# Patient Record
Sex: Female | Born: 1945
Health system: Southern US, Community
[De-identification: ages and names within clinical notes are randomized; demographics above are authoritative.]

## PROBLEM LIST (undated history)

## (undated) DIAGNOSIS — C679 Malignant neoplasm of bladder, unspecified: Secondary | ICD-10-CM

## (undated) DIAGNOSIS — C801 Malignant (primary) neoplasm, unspecified: Secondary | ICD-10-CM

## (undated) DIAGNOSIS — K439 Ventral hernia without obstruction or gangrene: Secondary | ICD-10-CM

## (undated) DIAGNOSIS — M1990 Unspecified osteoarthritis, unspecified site: Secondary | ICD-10-CM

## (undated) DIAGNOSIS — J302 Other seasonal allergic rhinitis: Secondary | ICD-10-CM

## (undated) DIAGNOSIS — I10 Essential (primary) hypertension: Secondary | ICD-10-CM

## (undated) DIAGNOSIS — G459 Transient cerebral ischemic attack, unspecified: Secondary | ICD-10-CM

## (undated) DIAGNOSIS — I739 Peripheral vascular disease, unspecified: Secondary | ICD-10-CM

## (undated) DIAGNOSIS — E785 Hyperlipidemia, unspecified: Secondary | ICD-10-CM

## (undated) DIAGNOSIS — IMO0001 Reserved for inherently not codable concepts without codable children: Secondary | ICD-10-CM

## (undated) DIAGNOSIS — Z5189 Encounter for other specified aftercare: Secondary | ICD-10-CM

## (undated) HISTORY — DX: Ventral hernia without obstruction or gangrene: K43.9

## (undated) HISTORY — DX: Transient cerebral ischemic attack, unspecified: G45.9

## (undated) HISTORY — DX: Malignant (primary) neoplasm, unspecified: C80.1

## (undated) HISTORY — DX: Peripheral vascular disease, unspecified: I73.9

## (undated) HISTORY — PX: BREAST SURGERY: SHX581

## (undated) HISTORY — DX: Hyperlipidemia, unspecified: E78.5

## (undated) HISTORY — PX: HERNIA REPAIR: SHX51

## (undated) HISTORY — DX: Essential (primary) hypertension: I10

## (undated) HISTORY — PX: DILATION AND CURETTAGE OF UTERUS: SHX78

## (undated) HISTORY — DX: Other seasonal allergic rhinitis: J30.2

## (undated) HISTORY — PX: ABDOMINAL HYSTERECTOMY: SHX81

---

## 2001-01-25 ENCOUNTER — Encounter: Admission: RE | Admit: 2001-01-25 | Discharge: 2001-04-25 | Payer: Self-pay | Admitting: *Deleted

## 2001-03-31 ENCOUNTER — Ambulatory Visit (HOSPITAL_COMMUNITY): Admission: RE | Admit: 2001-03-31 | Discharge: 2001-03-31 | Payer: Self-pay | Admitting: Gastroenterology

## 2001-04-21 ENCOUNTER — Encounter: Payer: Self-pay | Admitting: General Surgery

## 2001-04-21 ENCOUNTER — Encounter: Admission: RE | Admit: 2001-04-21 | Discharge: 2001-04-21 | Payer: Self-pay | Admitting: General Surgery

## 2001-04-21 ENCOUNTER — Other Ambulatory Visit: Admission: RE | Admit: 2001-04-21 | Discharge: 2001-04-21 | Payer: Self-pay | Admitting: General Surgery

## 2001-05-07 ENCOUNTER — Encounter: Admission: RE | Admit: 2001-05-07 | Discharge: 2001-05-07 | Payer: Self-pay | Admitting: General Surgery

## 2001-05-07 ENCOUNTER — Encounter: Payer: Self-pay | Admitting: General Surgery

## 2001-05-10 ENCOUNTER — Ambulatory Visit (HOSPITAL_BASED_OUTPATIENT_CLINIC_OR_DEPARTMENT_OTHER): Admission: RE | Admit: 2001-05-10 | Discharge: 2001-05-10 | Payer: Self-pay | Admitting: General Surgery

## 2001-05-10 ENCOUNTER — Encounter (INDEPENDENT_AMBULATORY_CARE_PROVIDER_SITE_OTHER): Payer: Self-pay | Admitting: *Deleted

## 2001-05-10 ENCOUNTER — Encounter: Payer: Self-pay | Admitting: General Surgery

## 2001-05-10 ENCOUNTER — Encounter: Admission: RE | Admit: 2001-05-10 | Discharge: 2001-05-10 | Payer: Self-pay | Admitting: General Surgery

## 2001-05-24 ENCOUNTER — Ambulatory Visit: Admission: RE | Admit: 2001-05-24 | Discharge: 2001-08-22 | Payer: Self-pay | Admitting: Radiation Oncology

## 2001-06-17 ENCOUNTER — Observation Stay (HOSPITAL_COMMUNITY): Admission: RE | Admit: 2001-06-17 | Discharge: 2001-06-18 | Payer: Self-pay | Admitting: General Surgery

## 2001-06-17 ENCOUNTER — Encounter (INDEPENDENT_AMBULATORY_CARE_PROVIDER_SITE_OTHER): Payer: Self-pay | Admitting: Specialist

## 2001-07-23 ENCOUNTER — Encounter: Payer: Self-pay | Admitting: *Deleted

## 2001-07-23 ENCOUNTER — Ambulatory Visit (HOSPITAL_COMMUNITY): Admission: RE | Admit: 2001-07-23 | Discharge: 2001-07-23 | Payer: Self-pay | Admitting: *Deleted

## 2001-07-27 ENCOUNTER — Ambulatory Visit (HOSPITAL_COMMUNITY): Admission: RE | Admit: 2001-07-27 | Discharge: 2001-07-27 | Payer: Self-pay | Admitting: *Deleted

## 2001-07-27 ENCOUNTER — Encounter: Payer: Self-pay | Admitting: *Deleted

## 2001-07-29 ENCOUNTER — Encounter: Payer: Self-pay | Admitting: Surgery

## 2001-07-29 ENCOUNTER — Ambulatory Visit (HOSPITAL_BASED_OUTPATIENT_CLINIC_OR_DEPARTMENT_OTHER): Admission: RE | Admit: 2001-07-29 | Discharge: 2001-07-29 | Payer: Self-pay | Admitting: Surgery

## 2001-08-24 ENCOUNTER — Emergency Department (HOSPITAL_COMMUNITY): Admission: EM | Admit: 2001-08-24 | Discharge: 2001-08-24 | Payer: Self-pay | Admitting: Emergency Medicine

## 2001-08-24 ENCOUNTER — Encounter: Payer: Self-pay | Admitting: Emergency Medicine

## 2001-08-27 ENCOUNTER — Encounter: Admission: RE | Admit: 2001-08-27 | Discharge: 2001-09-09 | Payer: Self-pay | Admitting: *Deleted

## 2001-12-02 ENCOUNTER — Ambulatory Visit (HOSPITAL_COMMUNITY): Admission: RE | Admit: 2001-12-02 | Discharge: 2001-12-02 | Payer: Self-pay | Admitting: General Surgery

## 2001-12-16 ENCOUNTER — Ambulatory Visit: Admission: RE | Admit: 2001-12-16 | Discharge: 2002-03-16 | Payer: Self-pay | Admitting: Radiation Oncology

## 2002-03-03 ENCOUNTER — Other Ambulatory Visit: Admission: RE | Admit: 2002-03-03 | Discharge: 2002-03-03 | Payer: Self-pay | Admitting: *Deleted

## 2002-10-06 ENCOUNTER — Encounter: Payer: Self-pay | Admitting: General Surgery

## 2002-10-06 ENCOUNTER — Encounter: Admission: RE | Admit: 2002-10-06 | Discharge: 2002-10-06 | Payer: Self-pay | Admitting: General Surgery

## 2002-12-07 ENCOUNTER — Encounter: Payer: Self-pay | Admitting: General Surgery

## 2002-12-07 ENCOUNTER — Encounter: Admission: RE | Admit: 2002-12-07 | Discharge: 2002-12-07 | Payer: Self-pay | Admitting: General Surgery

## 2002-12-07 ENCOUNTER — Encounter (INDEPENDENT_AMBULATORY_CARE_PROVIDER_SITE_OTHER): Payer: Self-pay | Admitting: Specialist

## 2003-09-25 ENCOUNTER — Other Ambulatory Visit: Admission: RE | Admit: 2003-09-25 | Discharge: 2003-09-25 | Payer: Self-pay | Admitting: Family Medicine

## 2004-11-03 ENCOUNTER — Emergency Department (HOSPITAL_COMMUNITY): Admission: EM | Admit: 2004-11-03 | Discharge: 2004-11-03 | Payer: Self-pay | Admitting: Family Medicine

## 2004-12-02 ENCOUNTER — Ambulatory Visit: Payer: Self-pay | Admitting: Hematology & Oncology

## 2005-02-13 ENCOUNTER — Other Ambulatory Visit: Admission: RE | Admit: 2005-02-13 | Discharge: 2005-02-13 | Payer: Self-pay | Admitting: Family Medicine

## 2005-06-05 ENCOUNTER — Ambulatory Visit: Payer: Self-pay | Admitting: Hematology & Oncology

## 2005-12-23 ENCOUNTER — Ambulatory Visit: Payer: Self-pay | Admitting: Hematology & Oncology

## 2006-06-22 ENCOUNTER — Ambulatory Visit: Payer: Self-pay | Admitting: Hematology & Oncology

## 2006-07-24 LAB — CBC WITH DIFFERENTIAL/PLATELET
BASO%: 0.4 % (ref 0.0–2.0)
Basophils Absolute: 0 10*3/uL (ref 0.0–0.1)
HCT: 40.5 % (ref 34.8–46.6)
HGB: 13.5 g/dL (ref 11.6–15.9)
MCHC: 33.3 g/dL (ref 32.0–36.0)
MONO#: 0.4 10*3/uL (ref 0.1–0.9)
NEUT%: 41.2 % (ref 39.6–76.8)
RDW: 15.4 % — ABNORMAL HIGH (ref 11.3–14.5)
WBC: 7 10*3/uL (ref 3.9–10.0)
lymph#: 3.6 10*3/uL — ABNORMAL HIGH (ref 0.9–3.3)

## 2006-07-24 LAB — CANCER ANTIGEN 27.29: CA 27.29: 35 U/mL (ref 0–39)

## 2006-07-24 LAB — COMPREHENSIVE METABOLIC PANEL
ALT: 17 U/L (ref 0–40)
Albumin: 4.3 g/dL (ref 3.5–5.2)
CO2: 26 mEq/L (ref 19–32)
Calcium: 9.5 mg/dL (ref 8.4–10.5)
Chloride: 104 mEq/L (ref 96–112)
Creatinine, Ser: 0.9 mg/dL (ref 0.40–1.20)
Potassium: 4.4 mEq/L (ref 3.5–5.3)

## 2006-08-03 ENCOUNTER — Ambulatory Visit (HOSPITAL_COMMUNITY): Admission: RE | Admit: 2006-08-03 | Discharge: 2006-08-03 | Payer: Self-pay | Admitting: Hematology & Oncology

## 2006-09-02 ENCOUNTER — Ambulatory Visit: Payer: Self-pay | Admitting: Hematology & Oncology

## 2007-01-13 ENCOUNTER — Ambulatory Visit: Payer: Self-pay | Admitting: Hematology & Oncology

## 2007-02-11 LAB — COMPREHENSIVE METABOLIC PANEL
ALT: 12 U/L (ref 0–35)
AST: 18 U/L (ref 0–37)
Albumin: 4.1 g/dL (ref 3.5–5.2)
Alkaline Phosphatase: 108 U/L (ref 39–117)
Calcium: 9.2 mg/dL (ref 8.4–10.5)
Chloride: 106 mEq/L (ref 96–112)
Potassium: 3.5 mEq/L (ref 3.5–5.3)
Sodium: 142 mEq/L (ref 135–145)

## 2007-02-11 LAB — CBC WITH DIFFERENTIAL/PLATELET
BASO%: 0.7 % (ref 0.0–2.0)
EOS%: 0.9 % (ref 0.0–7.0)
HGB: 12.6 g/dL (ref 11.6–15.9)
MCH: 29.4 pg (ref 26.0–34.0)
MCHC: 33.6 g/dL (ref 32.0–36.0)
MCV: 87.4 fL (ref 81.0–101.0)
MONO%: 4.9 % (ref 0.0–13.0)
RBC: 4.29 10*6/uL (ref 3.70–5.32)
RDW: 15 % — ABNORMAL HIGH (ref 11.3–14.5)
lymph#: 2.5 10*3/uL (ref 0.9–3.3)

## 2007-08-09 ENCOUNTER — Ambulatory Visit: Payer: Self-pay | Admitting: Hematology & Oncology

## 2007-09-30 ENCOUNTER — Ambulatory Visit: Payer: Self-pay | Admitting: Hematology & Oncology

## 2007-10-04 LAB — CBC WITH DIFFERENTIAL/PLATELET
BASO%: 1.5 % (ref 0.0–2.0)
Basophils Absolute: 0.1 10*3/uL (ref 0.0–0.1)
EOS%: 1.1 % (ref 0.0–7.0)
HGB: 12.9 g/dL (ref 11.6–15.9)
MCH: 29.9 pg (ref 26.0–34.0)
MCHC: 34.2 g/dL (ref 32.0–36.0)
MCV: 87.5 fL (ref 81.0–101.0)
MONO%: 4.1 % (ref 0.0–13.0)
NEUT%: 46.7 % (ref 39.6–76.8)
RDW: 11.8 % (ref 11.3–14.5)
lymph#: 2.8 10*3/uL (ref 0.9–3.3)

## 2007-10-04 LAB — COMPREHENSIVE METABOLIC PANEL
ALT: 13 U/L (ref 0–35)
AST: 20 U/L (ref 0–37)
Alkaline Phosphatase: 110 U/L (ref 39–117)
BUN: 14 mg/dL (ref 6–23)
Chloride: 107 mEq/L (ref 96–112)
Creatinine, Ser: 0.88 mg/dL (ref 0.40–1.20)
Potassium: 3.8 mEq/L (ref 3.5–5.3)

## 2008-03-28 ENCOUNTER — Ambulatory Visit: Payer: Self-pay | Admitting: Hematology & Oncology

## 2008-05-11 ENCOUNTER — Ambulatory Visit: Payer: Self-pay | Admitting: Hematology & Oncology

## 2008-05-15 LAB — CBC WITH DIFFERENTIAL/PLATELET
Basophils Absolute: 0 10*3/uL (ref 0.0–0.1)
Eosinophils Absolute: 0.1 10*3/uL (ref 0.0–0.5)
HCT: 38.9 % (ref 34.8–46.6)
HGB: 13.1 g/dL (ref 11.6–15.9)
MONO#: 0.4 10*3/uL (ref 0.1–0.9)
NEUT%: 47.7 % (ref 39.6–76.8)
WBC: 5.5 10*3/uL (ref 3.9–10.0)
lymph#: 2.4 10*3/uL (ref 0.9–3.3)

## 2008-05-15 LAB — COMPREHENSIVE METABOLIC PANEL
BUN: 13 mg/dL (ref 6–23)
CO2: 25 mEq/L (ref 19–32)
Calcium: 8.9 mg/dL (ref 8.4–10.5)
Chloride: 106 mEq/L (ref 96–112)
Creatinine, Ser: 0.77 mg/dL (ref 0.40–1.20)

## 2008-10-31 ENCOUNTER — Ambulatory Visit: Payer: Self-pay | Admitting: Hematology & Oncology

## 2008-11-02 LAB — CBC WITH DIFFERENTIAL/PLATELET
Basophils Absolute: 0 10*3/uL (ref 0.0–0.1)
EOS%: 1 % (ref 0.0–7.0)
HCT: 40.9 % (ref 34.8–46.6)
HGB: 13.6 g/dL (ref 11.6–15.9)
MCH: 29.3 pg (ref 26.0–34.0)
MCV: 87.8 fL (ref 81.0–101.0)
NEUT%: 47.8 % (ref 39.6–76.8)
lymph#: 3.2 10*3/uL (ref 0.9–3.3)

## 2008-11-03 LAB — COMPREHENSIVE METABOLIC PANEL
AST: 23 U/L (ref 0–37)
BUN: 15 mg/dL (ref 6–23)
CO2: 27 mEq/L (ref 19–32)
Calcium: 9.6 mg/dL (ref 8.4–10.5)
Chloride: 100 mEq/L (ref 96–112)
Creatinine, Ser: 0.93 mg/dL (ref 0.40–1.20)

## 2009-02-27 ENCOUNTER — Ambulatory Visit: Payer: Self-pay | Admitting: Hematology & Oncology

## 2009-04-18 ENCOUNTER — Ambulatory Visit: Payer: Self-pay | Admitting: Oncology

## 2009-04-20 LAB — CBC WITH DIFFERENTIAL/PLATELET
Basophils Absolute: 0 10*3/uL (ref 0.0–0.1)
EOS%: 1.2 % (ref 0.0–7.0)
HCT: 35.6 % (ref 34.8–46.6)
HGB: 11.9 g/dL (ref 11.6–15.9)
LYMPH%: 41.3 % (ref 14.0–49.7)
MCH: 29.7 pg (ref 25.1–34.0)
MCV: 88.7 fL (ref 79.5–101.0)
MONO%: 7.9 % (ref 0.0–14.0)
NEUT%: 48.8 % (ref 38.4–76.8)

## 2009-04-20 LAB — COMPREHENSIVE METABOLIC PANEL
AST: 16 U/L (ref 0–37)
Alkaline Phosphatase: 90 U/L (ref 39–117)
BUN: 15 mg/dL (ref 6–23)
Calcium: 9.3 mg/dL (ref 8.4–10.5)
Creatinine, Ser: 0.84 mg/dL (ref 0.40–1.20)
Total Bilirubin: 0.3 mg/dL (ref 0.3–1.2)

## 2009-05-28 ENCOUNTER — Encounter: Admission: RE | Admit: 2009-05-28 | Discharge: 2009-05-28 | Payer: Self-pay | Admitting: *Deleted

## 2010-03-19 ENCOUNTER — Ambulatory Visit: Payer: Self-pay | Admitting: Oncology

## 2010-04-29 ENCOUNTER — Ambulatory Visit: Payer: Self-pay | Admitting: Oncology

## 2010-05-01 LAB — COMPREHENSIVE METABOLIC PANEL
Albumin: 3.9 g/dL (ref 3.5–5.2)
CO2: 27 mEq/L (ref 19–32)
Calcium: 9 mg/dL (ref 8.4–10.5)
Glucose, Bld: 85 mg/dL (ref 70–99)
Potassium: 3.7 mEq/L (ref 3.5–5.3)
Sodium: 141 mEq/L (ref 135–145)
Total Protein: 7.3 g/dL (ref 6.0–8.3)

## 2010-05-01 LAB — CBC WITH DIFFERENTIAL/PLATELET
Eosinophils Absolute: 0 10*3/uL (ref 0.0–0.5)
LYMPH%: 44.1 % (ref 14.0–49.7)
MONO#: 0.5 10*3/uL (ref 0.1–0.9)
NEUT#: 2.9 10*3/uL (ref 1.5–6.5)
Platelets: 205 10*3/uL (ref 145–400)
RBC: 4.24 10*6/uL (ref 3.70–5.45)
RDW: 14.5 % (ref 11.2–14.5)
WBC: 6.4 10*3/uL (ref 3.9–10.3)

## 2010-05-09 ENCOUNTER — Emergency Department (HOSPITAL_COMMUNITY): Admission: EM | Admit: 2010-05-09 | Discharge: 2010-05-09 | Payer: Self-pay | Admitting: Family Medicine

## 2010-11-17 HISTORY — PX: COLONOSCOPY: SHX174

## 2010-12-19 ENCOUNTER — Emergency Department (HOSPITAL_COMMUNITY)
Admission: EM | Admit: 2010-12-19 | Discharge: 2010-12-19 | Disposition: A | Discharge status: Home / Self Care | Payer: Managed Care, Other (non HMO) | Attending: Emergency Medicine | Admitting: Emergency Medicine

## 2010-12-19 ENCOUNTER — Emergency Department (HOSPITAL_COMMUNITY): Payer: Managed Care, Other (non HMO)

## 2010-12-19 DIAGNOSIS — M5412 Radiculopathy, cervical region: Secondary | ICD-10-CM | POA: Insufficient documentation

## 2010-12-19 DIAGNOSIS — M79609 Pain in unspecified limb: Secondary | ICD-10-CM | POA: Insufficient documentation

## 2010-12-19 DIAGNOSIS — I10 Essential (primary) hypertension: Secondary | ICD-10-CM | POA: Insufficient documentation

## 2010-12-19 DIAGNOSIS — F17200 Nicotine dependence, unspecified, uncomplicated: Secondary | ICD-10-CM | POA: Insufficient documentation

## 2011-02-14 ENCOUNTER — Other Ambulatory Visit: Payer: Self-pay | Admitting: Family Medicine

## 2011-02-14 ENCOUNTER — Ambulatory Visit (INDEPENDENT_AMBULATORY_CARE_PROVIDER_SITE_OTHER): Payer: Managed Care, Other (non HMO) | Admitting: Family Medicine

## 2011-02-14 ENCOUNTER — Ambulatory Visit
Admission: RE | Admit: 2011-02-14 | Discharge: 2011-02-14 | Disposition: A | Discharge status: Home / Self Care | Payer: Managed Care, Other (non HMO) | Source: Ambulatory Visit | Attending: Family Medicine | Admitting: Family Medicine

## 2011-02-14 DIAGNOSIS — F17200 Nicotine dependence, unspecified, uncomplicated: Secondary | ICD-10-CM

## 2011-02-14 DIAGNOSIS — Z853 Personal history of malignant neoplasm of breast: Secondary | ICD-10-CM

## 2011-02-14 DIAGNOSIS — M79604 Pain in right leg: Secondary | ICD-10-CM

## 2011-02-14 DIAGNOSIS — M79605 Pain in left leg: Secondary | ICD-10-CM

## 2011-02-14 DIAGNOSIS — M79609 Pain in unspecified limb: Secondary | ICD-10-CM

## 2011-02-21 ENCOUNTER — Inpatient Hospital Stay (INDEPENDENT_AMBULATORY_CARE_PROVIDER_SITE_OTHER)
Admission: RE | Admit: 2011-02-21 | Discharge: 2011-02-21 | Disposition: A | Discharge status: Home / Self Care | Payer: Managed Care, Other (non HMO) | Source: Ambulatory Visit

## 2011-02-21 DIAGNOSIS — J309 Allergic rhinitis, unspecified: Secondary | ICD-10-CM

## 2011-03-11 HISTORY — PX: NM MYOCAR PERF WALL MOTION: HXRAD629

## 2011-03-16 ENCOUNTER — Inpatient Hospital Stay (INDEPENDENT_AMBULATORY_CARE_PROVIDER_SITE_OTHER)
Admission: RE | Admit: 2011-03-16 | Discharge: 2011-03-16 | Disposition: A | Discharge status: Home / Self Care | Payer: Managed Care, Other (non HMO) | Source: Ambulatory Visit | Attending: Family Medicine | Admitting: Family Medicine

## 2011-03-16 ENCOUNTER — Emergency Department (HOSPITAL_COMMUNITY)
Admission: EM | Admit: 2011-03-16 | Discharge: 2011-03-17 | Disposition: A | Discharge status: Home / Self Care | Payer: Managed Care, Other (non HMO) | Attending: Emergency Medicine | Admitting: Emergency Medicine

## 2011-03-16 DIAGNOSIS — R109 Unspecified abdominal pain: Secondary | ICD-10-CM | POA: Insufficient documentation

## 2011-03-16 DIAGNOSIS — I745 Embolism and thrombosis of iliac artery: Secondary | ICD-10-CM | POA: Insufficient documentation

## 2011-03-16 DIAGNOSIS — Z853 Personal history of malignant neoplasm of breast: Secondary | ICD-10-CM | POA: Insufficient documentation

## 2011-03-16 DIAGNOSIS — N23 Unspecified renal colic: Secondary | ICD-10-CM

## 2011-03-16 DIAGNOSIS — I10 Essential (primary) hypertension: Secondary | ICD-10-CM | POA: Insufficient documentation

## 2011-03-16 DIAGNOSIS — Z79899 Other long term (current) drug therapy: Secondary | ICD-10-CM | POA: Insufficient documentation

## 2011-03-16 LAB — CBC
HCT: 36 % (ref 36.0–46.0)
Hemoglobin: 11.6 g/dL — ABNORMAL LOW (ref 12.0–15.0)
RDW: 14.8 % (ref 11.5–15.5)
WBC: 6 10*3/uL (ref 4.0–10.5)

## 2011-03-16 LAB — POCT URINALYSIS DIP (DEVICE)
Glucose, UA: NEGATIVE mg/dL
Nitrite: NEGATIVE

## 2011-03-16 LAB — DIFFERENTIAL
Basophils Absolute: 0 10*3/uL (ref 0.0–0.1)
Eosinophils Relative: 2 % (ref 0–5)
Lymphocytes Relative: 49 % — ABNORMAL HIGH (ref 12–46)
Neutro Abs: 2.5 10*3/uL (ref 1.7–7.7)

## 2011-03-17 ENCOUNTER — Emergency Department (HOSPITAL_COMMUNITY): Payer: Managed Care, Other (non HMO)

## 2011-03-17 ENCOUNTER — Encounter (HOSPITAL_COMMUNITY): Payer: Self-pay | Admitting: Radiology

## 2011-03-17 LAB — URINALYSIS, ROUTINE W REFLEX MICROSCOPIC
Nitrite: NEGATIVE
Specific Gravity, Urine: 1.035 — ABNORMAL HIGH (ref 1.005–1.030)
pH: 6 (ref 5.0–8.0)

## 2011-03-17 LAB — BASIC METABOLIC PANEL
GFR calc Af Amer: >60 mL/min (ref >60)
GFR calc non Af Amer: >60 mL/min (ref >60)
Glucose, Bld: 93 mg/dL (ref 70–99)
Potassium: 3.3 mEq/L — ABNORMAL LOW (ref 3.5–5.1)
Sodium: 136 mEq/L (ref 135–145)

## 2011-03-17 LAB — URINE MICROSCOPIC-ADD ON

## 2011-03-17 MED ORDER — IOHEXOL 300 MG/ML  SOLN
100.0000 mL | Freq: Once | INTRAMUSCULAR | Status: AC | PRN
  Administered 2011-03-17: 100 mL via INTRAVENOUS

## 2011-03-18 LAB — URINE CULTURE: Colony Count: 75000

## 2011-03-25 ENCOUNTER — Observation Stay (HOSPITAL_COMMUNITY)
Admission: RE | Admit: 2011-03-25 | Discharge: 2011-03-25 | Disposition: A | Discharge status: Home / Self Care | Payer: Managed Care, Other (non HMO) | Source: Ambulatory Visit | Attending: Cardiovascular Disease | Admitting: Cardiovascular Disease

## 2011-03-25 DIAGNOSIS — I10 Essential (primary) hypertension: Secondary | ICD-10-CM | POA: Insufficient documentation

## 2011-03-25 DIAGNOSIS — I745 Embolism and thrombosis of iliac artery: Principal | ICD-10-CM | POA: Insufficient documentation

## 2011-03-25 DIAGNOSIS — E785 Hyperlipidemia, unspecified: Secondary | ICD-10-CM | POA: Insufficient documentation

## 2011-03-25 DIAGNOSIS — I739 Peripheral vascular disease, unspecified: Secondary | ICD-10-CM | POA: Insufficient documentation

## 2011-03-25 DIAGNOSIS — Z853 Personal history of malignant neoplasm of breast: Secondary | ICD-10-CM | POA: Insufficient documentation

## 2011-03-25 LAB — BASIC METABOLIC PANEL
Calcium: 9.3 mg/dL (ref 8.4–10.5)
Creatinine, Ser: 0.8 mg/dL (ref 0.4–1.2)
GFR calc Af Amer: >60 mL/min (ref >60)
GFR calc non Af Amer: >60 mL/min (ref >60)
Sodium: 140 mEq/L (ref 135–145)

## 2011-03-25 LAB — CBC
Hemoglobin: 12.9 g/dL (ref 12.0–15.0)
Platelets: 204 10*3/uL (ref 150–400)
RBC: 4.54 MIL/uL (ref 3.87–5.11)
WBC: 6 10*3/uL (ref 4.0–10.5)

## 2011-03-25 LAB — APTT: aPTT: 30 seconds (ref 24–37)

## 2011-03-25 LAB — PROTIME-INR
INR: 0.94 (ref 0.00–1.49)
Prothrombin Time: 12.8 seconds (ref 11.6–15.2)

## 2011-04-04 NOTE — Op Note (Signed)
[facility]  Patient:    [name], [name] Visit Number: [id number] MRN: [id number]          Service Type: DSU Location: DAY Attending Physician:  Caleen Essex Dictated by:   Ollen Gross. Vernell Morgans, M.D. Proc. Date: 12/02/01 Admit Date:  12/02/2001 Discharge Date: 12/02/2001                             Operative Report  PREOPERATIVE DIAGNOSIS:  Breast cancer.  POSTOPERATIVE DIAGNOSIS:  Breast cancer.  PROCEDURE:  Removal of Port-A-Cath.  SURGEON:  Ollen Gross. Vernell Morgans, M.D.  ANESTHESIA:  Local with IV sedation.  DESCRIPTION OF PROCEDURE:  After informed consent was obtained, the patient was brought to the operating room and placed in the supine position on the operating room table. Once the patient comfortable and sedated, the patients left chest was prepped with Betadine and draped in the usual sterile manner. The area around the Port-A-Cath was infiltrated with 1% lidocaine with epinephrine. A #15 blade knife was used to make an incision over top of the prior incision. This was carried down through the subcutaneous tissue, both bluntly with the hemostat and sharply with the Metzenbaum scissors until the Port-A-Cath was encountered. Once the Port-A-Cath was encountered, the pocket which the Port-A-Cath was sitting in was able to be opened. The Prolene anchoring stitches were identified and divided and removed from the patient. Once this was complete, Port-A-Cath was able to be pushed out of the cavity. The patient was then placed in Trendelenburg position and the Port-A-Cath was removed. Pressure was held for several minutes on the tract site. The lining of the Port-A-Cath cavity was then fulgurated with the electrocautery. A single Vicryl stitch was used to close the tract of the old catheter. The skin incision was then closed with 4-0 Monocryl subcuticular running stitch. Benzoin and Steri-Strips and sterile dressing were applied. The  patient tolerated the procedure well. At the end of the case, all needle, sponge, and instrument counts were correct. The patient was taken to the recovery room in stable condition. Dictated by:   Ollen Gross. Vernell Morgans, M.D. Attending Physician:  Caleen Essex DD:  12/05/01 TD:  12/06/01 Job: [id number]

## 2011-04-04 NOTE — Procedures (Signed)
[place]  Patient:    [name], [name]                     MRN: [id number] Proc. Date: 03/31/01 Adm. Date:  [id number] Attending:  Charna Elizabeth CC:         Heather Roberts, M.D.   Procedure Report  DATE OF BIRTH:  [birth year].  REFERRING PHYSICIAN:  Heather Roberts, M.D.  PROCEDURE PERFORMED:  Colonoscopy.  ENDOSCOPIST:  Anselmo Rod, M.D.  INSTRUMENT USED:  Olympus video colonoscope.  INDICATIONS FOR PROCEDURE:  Guaiac positive stool in a 65 year old African-American female rule out colonic polyps, masses, hemorrhoids, etc.  PREPROCEDURE PREPARATION:  Informed consent was procured from the patient. The patient was fasted for eight hours prior to the procedure and prepped with a bottle of magnesium citrate and a gallon of NuLytely the night prior to the procedure.  PREPROCEDURE PHYSICAL:  The patient had stable vital signs.  Neck supple. Chest clear to auscultation.  S1, S2 regular.  Abdomen soft with normal abdominal bowel sounds.  DESCRIPTION OF PROCEDURE:  The patient was placed in the left lateral decubitus position and sedated with 50 mg of Demerol and 5 mg of Versed intravenously.  Once the patient was adequately sedated and maintained on low-flow oxygen and continuous cardiac monitoring, the Olympus video colonoscope was advanced from the rectum to the cecum without difficulty.  The patient had an excellent prep.  There were a few early left-sided diverticula in the right colon.  No masses, polyps, erosions, ulcerations or hemorrhoids were seen.  IMPRESSION: 1. Few scattered right-handed diverticula. 2. No masses, polyps or hemorrhoids seen.  RECOMMENDATIONS: 1. Repeat guaiac testing will be done on an outpatient basis.  Further    recommendations made as need arises.DD:  03/31/01 TD:  03/31/01 Job: [id number]

## 2011-04-10 NOTE — Procedures (Signed)
NAMEYVANNA, [name]              ACCOUNT NO.:  [id number]  MEDICAL RECORD NO.:  [id number]           PATIENT TYPE:  O  LOCATION:  6522                         FACILITY:  [facility]  PHYSICIAN:  Nanetta Batty, M.D.   DATE OF BIRTH:  1945-11-25  DATE OF PROCEDURE: DATE OF DISCHARGE:  03/25/2011                   PERIPHERAL VASCULAR INVASIVE PROCEDURE   Ms. Yard is a 65 year old thin-appearing African American female with bilateral extremity claudication and abnormal Doppler studies.  Her other problems include hypertension and hyperlipidemia.  She had breast cancer in remission since 2000.  She currently had a CTA that showed occlusion of her external iliac arteries.  She presents now for angiography and potential percutaneous intervention for lifestyle- limiting claudication.  PROCEDURE DESCRIPTION:  The patient was brought to the Second Floor [facility] in the postabsorptive state.  She is premedicated with p.o. Valium, IV Versed and fentanyl.  Her right wrist was prepped and shaved in usual sterile fashion.  Xylocaine 1% was used for local anesthesia.  A 5-French sheath was inserted into the right radial artery using standard back wall pullback technique.  A 4000 units of heparin was administered intravenously.  The patient received 5 mL of "radial cocktail via side-arm sheath.  A 5-French 90-cm long pigtail catheter was then advanced over "a safety J" in the distal abdominal aorta at the level of renal arteries and bifemoral runoff was obtained using a bolus chase digital subtraction step table technique.  Visipaque dye was used for the entirety of the case.  Retrograde aortic pressure was monitored during the case.  ANGIOGRAPHIC RESULT: 1. Abdominal aorta.     a.     Renal arteries - normal.     b.     Infrarenal abdominal aorta - normal. 2. Left lower extremity;     a.     Totally occluded left external iliac artery at its origin  reconstituting just above the femoral head and distal common      femoral artery.  There was three-vessel runoff. 3. Right lower extremity;     a.     Totally occluded right external iliac artery at its origin      with reconstitution just about the femoral head and common femoral      artery with two-vessel runoff.  IMPRESSION:  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  I believe the areas of occlusion is long and stenting would need to occur to the femoral head with a long stent.  I believe the best treatment would be aortobifemoral bypass grafting.  The radial sheath was removed and TR band was placed on the wrist to achieve patent hemostasis.  The patient left the lab in stable condition.  She will be discharged home as an home.  We will see her back in the office for followup.  She has had negative Myoview.  She will be referred for surgical revascularization.     Nanetta Batty, M.D.     JB/MEDQ  D:  03/25/2011  T:  03/26/2011  Job:  [id number]  cc:   Second Floor [place] [facility]  Suite [facility] Sharlot Gowda, M.D.  Electronically Signed by Nanetta Batty M.D. on 04/10/2011 03:41:50 PM

## 2011-05-05 ENCOUNTER — Encounter (INDEPENDENT_AMBULATORY_CARE_PROVIDER_SITE_OTHER): Payer: Managed Care, Other (non HMO) | Admitting: Surgery

## 2011-05-05 DIAGNOSIS — I7092 Chronic total occlusion of artery of the extremities: Secondary | ICD-10-CM

## 2011-05-05 NOTE — Assessment & Plan Note (Signed)
OFFICE VISIT  [name], [name] DOB:  03-Feb-1946                                       05/05/2011 EAVWU#:[id number]  REASON FOR VISIT:  Bilateral leg pain.  REFERRING PHYSICIAN:  Dr. Allyson Sabal.  PRIMARY CARE PHYSICIAN:  Dr. Susann Givens.  HISTORY:  This is a 65 year old female seen at the request by Dr. Allyson Sabal for bilateral lifestyle-limiting claudication.  The patient states that at approximately 50 feet, her legs begin to cramp and give out.  Her right leg is worse than the left.  This has been going on for approximately 2 years but has been getting worse over the past year. The patient states that rest does make them better, but that these have severely altered her lifestyle.  She denies rest pain.  She denies nonhealing wounds.  The patient suffers from hypertension which is medically managed.  She is also diet-controlled for treating her hypercholesterolemia.  She does smoke but has been cutting back.  REVIEW OF SYSTEMS:  GENERAL:  Positive for weight (30 pounds). VASCULAR:  Positive pain in the legs when lying flat and with walking. CARDIAC:  Negative. GI:  Negative. NEURO:  Positive for dizziness, headaches. PULMONARY:  Negative. HEME:  Negative. GU:  Negative. ENT:  Negative. MUSCULOSKELETAL:  Positive for arthritis, joint pain, muscle pain. PSYCH:  Negative. SKIN:  Negative.  PAST MEDICAL HISTORY:  Hypertension, hypercholesterolemia, history of breast cancer, peripheral vascular disease.  SURGICAL HISTORY:  Hysterectomy through a Pfannenstiel incision and multiple breast surgeries for her breast cancer.  SOCIAL HISTORY:  She is married with 1 child.  Smokes a half pack a day. Does not drink alcohol regularly.  FAMILY HISTORY:  Positive for an MI in her brother in his 76s and an MI and stroke in her sister.  ALLERGIES:  None.  PHYSICAL EXAMINATION:  Heart rate 80, blood pressure 124/82, O2 sat is 99%.  General:  She is well-appearing in  no distress.  HEENT:  Within normal limits.  Lungs are clear bilaterally.  Cardiovascular:  Regular rate and rhythm.  No murmur.  Abdomen:  Soft, nontender.  Well-healed incisions.  No hernia.  Musculoskeletal:  No major deformities.  Neuro: No focal deficits.  Skin:  Without rash.  DIAGNOSTIC STUDIES:  I have reviewed her angiogram, which showed bilateral external iliac occlusion with 3-vessel runoff.  ASSESSMENT:  Bilateral external iliac occlusion with lifestyle-limiting claudication.  PLAN:  We discussed the options of continued medical management versus surgical intervention via an aortobifemoral bypass graft.  I discussed all the pros and cons of each option, including the risk of surgery which included but not limited to the risk of death, the risk of cardiopulmonary complications, the risk of infection, and GI complications.  She understands all these risks and wishes to proceed with aortobifemoral bypass grafting which has been scheduled for Thursday, June 21.  We will give her a bowel prep today.  She has gotten carotid Dopplers in Dr. Hazle Coca office, according to the patient.  They were okay.  She has also received cardiac clearance by Dr. Allyson Sabal.    Jorge Ny, MD Electronically Signed  VWB/MEDQ  D:  05/05/2011  T:  05/05/2011  Job:  [id number]  cc:   Sharlot Gowda, M.D. Nanetta Batty, M.D.

## 2011-05-13 ENCOUNTER — Other Ambulatory Visit: Payer: Self-pay | Admitting: Surgery

## 2011-05-13 ENCOUNTER — Encounter (HOSPITAL_COMMUNITY)
Admission: RE | Admit: 2011-05-13 | Discharge: 2011-05-13 | Disposition: A | Discharge status: Home / Self Care | Payer: Managed Care, Other (non HMO) | Source: Ambulatory Visit | Attending: Surgery | Admitting: Surgery

## 2011-05-13 ENCOUNTER — Ambulatory Visit (HOSPITAL_COMMUNITY)
Admission: RE | Admit: 2011-05-13 | Discharge: 2011-05-13 | Disposition: A | Discharge status: Home / Self Care | Payer: Managed Care, Other (non HMO) | Source: Ambulatory Visit | Attending: Surgery | Admitting: Surgery

## 2011-05-13 DIAGNOSIS — Z01818 Encounter for other preprocedural examination: Secondary | ICD-10-CM | POA: Insufficient documentation

## 2011-05-13 DIAGNOSIS — Z01810 Encounter for preprocedural cardiovascular examination: Secondary | ICD-10-CM | POA: Insufficient documentation

## 2011-05-13 DIAGNOSIS — Z01812 Encounter for preprocedural laboratory examination: Secondary | ICD-10-CM | POA: Insufficient documentation

## 2011-05-13 DIAGNOSIS — Z01811 Encounter for preprocedural respiratory examination: Secondary | ICD-10-CM | POA: Insufficient documentation

## 2011-05-13 DIAGNOSIS — I739 Peripheral vascular disease, unspecified: Secondary | ICD-10-CM

## 2011-05-13 LAB — BLOOD GAS, ARTERIAL
Acid-Base Excess: 1.8 mmol/L (ref 0.0–2.0)
Drawn by: 181601
FIO2: 0.21 %
pCO2 arterial: 37.3 mmHg (ref 35.0–45.0)
pH, Arterial: 7.45 — ABNORMAL HIGH (ref 7.350–7.400)
pO2, Arterial: 109 mmHg — ABNORMAL HIGH (ref 80.0–100.0)

## 2011-05-13 LAB — COMPREHENSIVE METABOLIC PANEL
AST: 21 U/L (ref 0–37)
Albumin: 3.4 g/dL — ABNORMAL LOW (ref 3.5–5.2)
Alkaline Phosphatase: 112 U/L (ref 39–117)
BUN: 14 mg/dL (ref 6–23)
CO2: 24 mEq/L (ref 19–32)
Chloride: 105 mEq/L (ref 96–112)
Potassium: 3.7 mEq/L (ref 3.5–5.1)
Total Bilirubin: 0.3 mg/dL (ref 0.3–1.2)

## 2011-05-13 LAB — PROTIME-INR
INR: 0.94 (ref 0.00–1.49)
Prothrombin Time: 12.8 seconds (ref 11.6–15.2)

## 2011-05-13 LAB — ABO/RH: ABO/RH(D): B POS

## 2011-05-13 LAB — URINALYSIS, ROUTINE W REFLEX MICROSCOPIC
Glucose, UA: NEGATIVE mg/dL
Ketones, ur: NEGATIVE mg/dL
Leukocytes, UA: NEGATIVE
Nitrite: NEGATIVE
Protein, ur: NEGATIVE mg/dL
pH: 5.5 (ref 5.0–8.0)

## 2011-05-13 LAB — CBC
Hemoglobin: 12.7 g/dL (ref 12.0–15.0)
MCH: 29.5 pg (ref 26.0–34.0)
Platelets: 209 10*3/uL (ref 150–400)
RBC: 4.31 MIL/uL (ref 3.87–5.11)

## 2011-05-13 LAB — TYPE AND SCREEN: Antibody Screen: NEGATIVE

## 2011-05-15 ENCOUNTER — Inpatient Hospital Stay (HOSPITAL_COMMUNITY)
Admission: RE | Admit: 2011-05-15 | Discharge: 2011-05-19 | DRG: 238 | Disposition: A | Discharge status: Home / Self Care | Payer: Managed Care, Other (non HMO) | Source: Ambulatory Visit | Attending: Surgery | Admitting: Surgery

## 2011-05-15 ENCOUNTER — Inpatient Hospital Stay (HOSPITAL_COMMUNITY): Payer: Managed Care, Other (non HMO)

## 2011-05-15 DIAGNOSIS — M129 Arthropathy, unspecified: Secondary | ICD-10-CM | POA: Diagnosis present

## 2011-05-15 DIAGNOSIS — G609 Hereditary and idiopathic neuropathy, unspecified: Secondary | ICD-10-CM | POA: Diagnosis present

## 2011-05-15 DIAGNOSIS — F17200 Nicotine dependence, unspecified, uncomplicated: Secondary | ICD-10-CM | POA: Diagnosis present

## 2011-05-15 DIAGNOSIS — I10 Essential (primary) hypertension: Secondary | ICD-10-CM | POA: Diagnosis present

## 2011-05-15 DIAGNOSIS — E7800 Pure hypercholesterolemia, unspecified: Secondary | ICD-10-CM | POA: Diagnosis present

## 2011-05-15 DIAGNOSIS — I70219 Atherosclerosis of native arteries of extremities with intermittent claudication, unspecified extremity: Secondary | ICD-10-CM

## 2011-05-15 DIAGNOSIS — I700 Atherosclerosis of aorta: Secondary | ICD-10-CM | POA: Diagnosis present

## 2011-05-15 DIAGNOSIS — Z853 Personal history of malignant neoplasm of breast: Secondary | ICD-10-CM

## 2011-05-15 DIAGNOSIS — J449 Chronic obstructive pulmonary disease, unspecified: Secondary | ICD-10-CM | POA: Diagnosis present

## 2011-05-15 DIAGNOSIS — J4489 Other specified chronic obstructive pulmonary disease: Secondary | ICD-10-CM | POA: Diagnosis present

## 2011-05-15 HISTORY — PX: VASCULAR SURGERY: SHX849

## 2011-05-15 HISTORY — PX: OTHER SURGICAL HISTORY: SHX169

## 2011-05-15 LAB — BASIC METABOLIC PANEL
CO2: 27 mEq/L (ref 19–32)
Glucose, Bld: 138 mg/dL — ABNORMAL HIGH (ref 70–99)
Potassium: 3.3 mEq/L — ABNORMAL LOW (ref 3.5–5.1)
Sodium: 138 mEq/L (ref 135–145)

## 2011-05-15 LAB — GLUCOSE, CAPILLARY

## 2011-05-15 LAB — CBC
Hemoglobin: 10.8 g/dL — ABNORMAL LOW (ref 12.0–15.0)
MCH: 28.2 pg (ref 26.0–34.0)
MCHC: 32.8 g/dL (ref 30.0–36.0)
MCV: 85.9 fL (ref 78.0–100.0)
RBC: 3.83 MIL/uL — ABNORMAL LOW (ref 3.87–5.11)

## 2011-05-16 ENCOUNTER — Inpatient Hospital Stay (HOSPITAL_COMMUNITY): Payer: Managed Care, Other (non HMO)

## 2011-05-16 LAB — CBC
MCHC: 32.5 g/dL (ref 30.0–36.0)
Platelets: 128 10*3/uL — ABNORMAL LOW (ref 150–400)
RDW: 15 % (ref 11.5–15.5)

## 2011-05-16 LAB — GLUCOSE, CAPILLARY
Glucose-Capillary: 120 mg/dL — ABNORMAL HIGH (ref 70–99)
Glucose-Capillary: 109 mg/dL — ABNORMAL HIGH (ref 70–99)
Glucose-Capillary: 114 mg/dL — ABNORMAL HIGH (ref 70–99)

## 2011-05-16 LAB — COMPREHENSIVE METABOLIC PANEL
ALT: 15 U/L (ref 0–35)
Albumin: 2.6 g/dL — ABNORMAL LOW (ref 3.5–5.2)
Alkaline Phosphatase: 82 U/L (ref 39–117)
BUN: 6 mg/dL (ref 6–23)
Potassium: 4.1 mEq/L (ref 3.5–5.1)
Sodium: 135 mEq/L (ref 135–145)
Total Protein: 6.3 g/dL (ref 6.0–8.3)

## 2011-05-17 LAB — GLUCOSE, CAPILLARY
Glucose-Capillary: 91 mg/dL (ref 70–99)
Glucose-Capillary: 97 mg/dL (ref 70–99)

## 2011-05-17 LAB — BASIC METABOLIC PANEL
Calcium: 8.3 mg/dL — ABNORMAL LOW (ref 8.4–10.5)
GFR calc Af Amer: >60 mL/min (ref >60)
GFR calc non Af Amer: >60 mL/min (ref >60)
Potassium: 3.9 mEq/L (ref 3.5–5.1)
Sodium: 134 mEq/L — ABNORMAL LOW (ref 135–145)

## 2011-05-17 LAB — CBC
MCH: 28.3 pg (ref 26.0–34.0)
MCHC: 32.6 g/dL (ref 30.0–36.0)
Platelets: 91 10*3/uL — ABNORMAL LOW (ref 150–400)

## 2011-05-18 LAB — GLUCOSE, CAPILLARY
Glucose-Capillary: 113 mg/dL — ABNORMAL HIGH (ref 70–99)
Glucose-Capillary: 101 mg/dL — ABNORMAL HIGH (ref 70–99)
Glucose-Capillary: 84 mg/dL (ref 70–99)
Glucose-Capillary: 98 mg/dL (ref 70–99)

## 2011-05-18 LAB — BASIC METABOLIC PANEL
CO2: 29 mEq/L (ref 19–32)
Calcium: 8.6 mg/dL (ref 8.4–10.5)
Chloride: 100 mEq/L (ref 96–112)
Potassium: 3.6 mEq/L (ref 3.5–5.1)
Sodium: 133 mEq/L — ABNORMAL LOW (ref 135–145)

## 2011-05-18 LAB — CBC
MCH: 28.4 pg (ref 26.0–34.0)
Platelets: 83 10*3/uL — ABNORMAL LOW (ref 150–400)
RBC: 3.34 MIL/uL — ABNORMAL LOW (ref 3.87–5.11)
WBC: 12.9 10*3/uL — ABNORMAL HIGH (ref 4.0–10.5)

## 2011-05-19 LAB — CBC
HCT: 28.7 % — ABNORMAL LOW (ref 36.0–46.0)
MCV: 84.2 fL (ref 78.0–100.0)
RBC: 3.41 MIL/uL — ABNORMAL LOW (ref 3.87–5.11)
RDW: 14.4 % (ref 11.5–15.5)
WBC: 7.9 10*3/uL (ref 4.0–10.5)

## 2011-05-23 ENCOUNTER — Ambulatory Visit (INDEPENDENT_AMBULATORY_CARE_PROVIDER_SITE_OTHER): Payer: Managed Care, Other (non HMO)

## 2011-05-23 DIAGNOSIS — R509 Fever, unspecified: Secondary | ICD-10-CM

## 2011-05-23 NOTE — Assessment & Plan Note (Signed)
OFFICE VISIT  [name], [name] DOB:  05/24/1946                                       05/23/2011 [id number]#:[id number]  This is a patient of Dr. Estanislado Spire.  Dr. Edilia Bo is the attending physician in the office today.  HISTORY OF PRESENT ILLNESS:  The patient is a 65 year old woman who had an aortobifemoral bypass by Dr. Myra Gianotti for life altering claudication on 05/15/2011.  She had been doing well with occasional low-grade temperature of 99 until yesterday when she said she felt generally warm but had no chills, cough, pain or drainage from her wounds and said she took her temperature, was 100 and then it was 103.  She took Tylenol and it came back down to 99 range.  As above she denies any chills, cough, abdominal pain.  She has had no cramping diarrhea.  She has had no drainage from her wounds and otherwise generally feels well.  She has only been using Tylenol and occasional oxycodone for pain.  Otherwise she says she is doing well except for decreased appetite.  She denies any burning or pain when she urinates.  PHYSICAL EXAMINATION:  General:  On physical exam this is a well- developed, well-nourished woman in no acute distress.  Vital signs:  Her blood pressure is 121/74, sats were 98, temperature is 98.4 and her heart rate was 98.  Abdomen:  Soft and minimally tender around the incisions.  The incisions were clean and dry.  She had 1+ palpable DP bilaterally.  She had a 2+ PT on the right.  Lungs:  Clear without wheezes, rales or rhonchi.  There is no evidence of cellulitis, drainage or fluctuant masses in either groin wound or abdominal wound.  ASSESSMENT:  Postop fever in this patient who is 1 week status post aortobifemoral bypass with no evidence of urinary tract infection, cough, drainage or swelling in any of her wounds.  The fever may have been due to mucus plug.  She is using her incentive spirometer at home. Otherwise she is doing well.  The  patient is afebrile without any signs of infection.  The patient was advised on the uses of Tylenol for fever in conjunction with her pain medication which also has Tylenol in it. They will call if she has any other further issues of pain, cramping diarrhea, dysuria or develops a cough or fever.  Della Goo, PA-C  [place]. Edilia Bo, M.D. Electronically Signed  RR/MEDQ  D:  05/23/2011  T:  05/23/2011  Job:  [id number]

## 2011-05-29 ENCOUNTER — Encounter: Payer: Self-pay | Admitting: Vascular Surgery

## 2011-05-29 ENCOUNTER — Ambulatory Visit (INDEPENDENT_AMBULATORY_CARE_PROVIDER_SITE_OTHER): Payer: Managed Care, Other (non HMO) | Admitting: Vascular Surgery

## 2011-05-29 ENCOUNTER — Inpatient Hospital Stay (HOSPITAL_COMMUNITY)
Admission: AD | Admit: 2011-05-29 | Discharge: 2011-06-03 | DRG: 690 | Disposition: A | Discharge status: Home / Self Care | Payer: Managed Care, Other (non HMO) | Source: Ambulatory Visit | Attending: Surgery | Admitting: Surgery

## 2011-05-29 ENCOUNTER — Inpatient Hospital Stay (HOSPITAL_COMMUNITY): Payer: Managed Care, Other (non HMO)

## 2011-05-29 VITALS — BP 125/81 | HR 75 | Temp 98.3°F

## 2011-05-29 DIAGNOSIS — K8020 Calculus of gallbladder without cholecystitis without obstruction: Secondary | ICD-10-CM | POA: Diagnosis present

## 2011-05-29 DIAGNOSIS — N390 Urinary tract infection, site not specified: Principal | ICD-10-CM | POA: Diagnosis present

## 2011-05-29 DIAGNOSIS — N1330 Unspecified hydronephrosis: Secondary | ICD-10-CM | POA: Diagnosis present

## 2011-05-29 DIAGNOSIS — I739 Peripheral vascular disease, unspecified: Secondary | ICD-10-CM

## 2011-05-29 DIAGNOSIS — A498 Other bacterial infections of unspecified site: Secondary | ICD-10-CM | POA: Diagnosis present

## 2011-05-29 DIAGNOSIS — Z79899 Other long term (current) drug therapy: Secondary | ICD-10-CM

## 2011-05-29 DIAGNOSIS — R110 Nausea: Secondary | ICD-10-CM | POA: Diagnosis present

## 2011-05-29 LAB — CBC
HCT: 31.6 % — ABNORMAL LOW (ref 36.0–46.0)
MCHC: 33.9 g/dL (ref 30.0–36.0)
MCV: 85.4 fL (ref 78.0–100.0)
RDW: 14.8 % (ref 11.5–15.5)

## 2011-05-29 LAB — COMPREHENSIVE METABOLIC PANEL
Albumin: 2.6 g/dL — ABNORMAL LOW (ref 3.5–5.2)
BUN: 9 mg/dL (ref 6–23)
Creatinine, Ser: 0.57 mg/dL (ref 0.50–1.10)
Total Protein: 7.7 g/dL (ref 6.0–8.3)

## 2011-05-29 LAB — DIFFERENTIAL
Eosinophils Relative: 8 % — ABNORMAL HIGH (ref 0–5)
Lymphocytes Relative: 32 % (ref 12–46)
Lymphs Abs: 2.6 10*3/uL (ref 0.7–4.0)
Monocytes Absolute: 0.5 10*3/uL (ref 0.1–1.0)

## 2011-05-29 LAB — LIPASE, BLOOD: Lipase: 11 U/L (ref 11–59)

## 2011-05-29 NOTE — Progress Notes (Signed)
Postop aortobifemoral BPG on 05-15-11 by VWB,  Pt c/o nausea, diarrhea, anorexia, increased bloating worse over last week.

## 2011-05-29 NOTE — Progress Notes (Signed)
Patient is a 65 year old female who underwent aortobifemoral bypass by Dr. Myra Gianotti on 05/15/2011. She was seen by our PA which Della Goo on 05/23/2011 complaining of intermittent fever. Her fever symptoms have resolved at this point but she has had anorexia with some nausea. She has had no vomiting. She does not really complain of diarrhea but has crampy abdominal pain with some loose stools.  Past medical history hypertension  Medications she is currently only taking hydrochlorothiazide 25 mg tablet half tablet once daily  Allergies no known drug allergies  Physical exam  Abdomen: Healing midline laparotomy scar as well as healing groin incisions no erythema no drainage, hypoactive bowel sounds  Chest clear to auscultation  Cardiac regular rate and rhythm  Extremities feet pink warm well perfused no edema  Assessment: Postoperative failure to thrive either secondary to ileus, pain, or possibly C. difficile colitis.  Plan: Admit to [facility], and for IV hydration, keep n.p.o. for now, we'll obtain baseline lab work and abdominal x-rays.

## 2011-05-31 ENCOUNTER — Inpatient Hospital Stay (HOSPITAL_COMMUNITY): Payer: Managed Care, Other (non HMO)

## 2011-05-31 LAB — CLOSTRIDIUM DIFFICILE BY PCR: Toxigenic C. Difficile by PCR: NEGATIVE

## 2011-05-31 MED ORDER — IOHEXOL 300 MG/ML  SOLN
100.0000 mL | Freq: Once | INTRAMUSCULAR | Status: AC | PRN
  Administered 2011-05-31: 100 mL via INTRAVENOUS

## 2011-06-01 LAB — BASIC METABOLIC PANEL
BUN: 7 mg/dL (ref 6–23)
CO2: 29 mEq/L (ref 19–32)
Chloride: 98 mEq/L (ref 96–112)
GFR calc Af Amer: >60 mL/min (ref >60)
Potassium: 3.5 mEq/L (ref 3.5–5.1)

## 2011-06-01 LAB — CBC
HCT: 31.8 % — ABNORMAL LOW (ref 36.0–46.0)
MCV: 84.6 fL (ref 78.0–100.0)
RBC: 3.76 MIL/uL — ABNORMAL LOW (ref 3.87–5.11)
RDW: 14.6 % (ref 11.5–15.5)
WBC: 6.5 10*3/uL (ref 4.0–10.5)

## 2011-06-01 LAB — DIFFERENTIAL
Basophils Absolute: 0 10*3/uL (ref 0.0–0.1)
Eosinophils Relative: 9 % — ABNORMAL HIGH (ref 0–5)
Lymphocytes Relative: 27 % (ref 12–46)
Lymphs Abs: 1.8 10*3/uL (ref 0.7–4.0)
Neutro Abs: 3.6 10*3/uL (ref 1.7–7.7)
Neutrophils Relative %: 55 % (ref 43–77)

## 2011-06-01 NOTE — Op Note (Signed)
[name], [name]              ACCOUNT NO.:  [id number]  MEDICAL RECORD NO.:  [id number]  LOCATION:                                 FACILITY:  PHYSICIAN:  Juleen China IV, MDDATE OF BIRTH:  01-09-1946  DATE OF PROCEDURE:  05/15/2011 DATE OF DISCHARGE:                              OPERATIVE REPORT   PREOPERATIVE DIAGNOSIS:  Bilateral claudication.  POSTOPERATIVE DIAGNOSIS:  Bilateral claudication.  PROCEDURE PERFORMED:  Aortobifemoral bypass graft using a 14 x 8 bifurcated Dacron graft.  SURGEON: 1. Charlena Cross, MD  ASSISTANT:  Pecola Leisure, PA.  BLOOD LOSS:  150 mL.  COMPLICATIONS:  None.  FINDINGS:  End-to-end aortic anastomosis, end-to-side distal anastomosis to the distal common femoral artery bilaterally.  INDICATION:  Ms. Para is a 65 year old female who has been suffering from bilateral lifestyle-limiting claudication.  Angiography has revealed she has iliac occlusion.  She does not wish to tolerate the level of discomfort she is having and wanted to proceed with operative repair.  PROCEDURE:  The patient was identified in the holding area, taken to room 9, placed supine on the table.  General endotracheal anesthesia was administered.  The patient was prepped and draped in usual fashion.  A time-out was called.  Antibiotics were given.  Longitudinal incisions were made in each groin over the common femoral artery.  Cautery was used to divide the subcutaneous tissue.  The femoral sheaths were identified bilaterally and opened with Metzenbaum scissors.  The common femoral artery was then circumferentially dissected free from the inguinal ligament down to the profunda and superficial femoral arteries which were each individually isolated.  Side branches were also isolated with silk ties.  I created a tunnel underneath the inguinal ligament.  I could not identify crossing veins.  Once adequate groin exposure was obtained, they were both packed  with moist Ray-Tec and attention was turned towards the abdomen.  A midline incision was made from the __________ to the pubis.  The cautery was used to divided the subcutaneous tissue.  The midline abdominal fascia was identified and opened with cautery.  The peritoneal cavity was then entered sharply and then opened throughout the length of the incision.  The patient did have some omental attachments to the anterior abdominal wall which were taken down with both sharp and cautery dissection.  The abdomen was then inspected.  There was no gross pathology.  A Balfour was then placed. The transverse colon was reflected cephalad and the small bowel mobilized to the patient's right.  An Omni tract retractor was then set up to aid with exposure.  The ligament of Treitz was taken down sharply. I identified the inferior mesenteric vein which was divided between 2-0 silk ties.  I then used cautery dissection to get down to the anterior surface of the aorta.  The aorta was then exposed along its anterior border up to the renal vein.  The inferior mesenteric artery was isolated and encircled with a red vessel loop.  I then performed circumferential mobilization of the aorta at the level of the renal arteries.  I then exposed the common iliac arteries bilaterally.  I then created a tunnel to each groin  going directly anterior to the iliac vessels and posterior to the ureter.  Umbilical tape was passed.  I contemplated an end-to-side aortic anastomosis because of the occluded external iliac arteries, however, there was 3 dense calcifications on the right lateral aortic wall which I felt may cause a problem with end- to-side anastomosis and therefore I elected to proceed with an end-to- end anastomosis.  At this point, the patient was given systemic heparinization.  After heparin circulated, the proximal aorta was occluded with a Hartmann clamp.  The inferior mesenteric artery was occluded with a  serrefine clamp and the aorta below the inferior mesenteric artery was occluded with a peripheral Debakey.  I then opened the aorta with #11 blade and then transected it with curved Mayo scissors.  I did resect a small portion of the aorta so that the graft would lie better.  I then oversewed the distal end of the aorta with running 3-0 Prolene in 2 layers incorporating a felt strip.  I selected a 14 x 7 Dacron graft.  It was cut to appropriate length and then an end- to-end anastomosis was completed.  This was done using a felt strip and 3 horizontal mattress back wall sutures and running the lateral 2 sutures around anteriorly incorporating the felt strip.  Once the anastomosis was completed, straight Fogarty clamps were placed in the limbs of the graft and the Hartmann clamp was removed.  The proximal anastomosis was hemostatic.  The limbs of the graft was then brought through the previously created tunnels into the groin.  I performed the right groin anastomosis first.  A #11 blade was used to make an arteriotomy which was extended longitudinally with Potts scissors down to the origin of the profunda.  There was significant posterior plaque but both the profunda and superficial femoral artery freely back bled without stenosis.  The graft was cut to the appropriate length and then beveled to fit the size of the arteriotomy.  A running anastomosis was created with 5-0 Prolene prior to completion.  Appropriate flush maneuvers were performed and blood flow was reestablished to the right leg.  The right groin was then packed and attention was turned towards the left groin.  A #11 blade was used to make an arteriotomy which was extended with Potts scissors down to the profunda takeoff.  Again there was significant posterior plaque but no significant stenosis within the profunda and superficial femoral artery which freely back bled.  The graft was cut to the appropriate length and then  spatulated to fit the size of the arteriotomy.  A running anastomosis was created with 5-0 Prolene prior to completion.  Appropriate flush maneuvers were performed and blood flow was reestablished to the left leg.  Handheld Doppler was used to evaluate the signals in the profunda femoral and superficial femoral artery bilaterally.  They were multiphasic with the graft open and monophasic with the graft occluded.  At this point, attention was turned back towards the abdomen.  I  had originally planned to reimplant the inferior mesenteric artery.  However, there was multiphasic signal within the inferior mesenteric artery and it had a palpable pulse. Because I had not ligated any branches within the femoral artery, I did not feel it was necessary to reimplant the inferior mesenteric artery. I also evaluated the sigmoid colon mesentery and had a good Doppler signal.  At this point, the patient's heparin was reversed with 50 mg of protamine.  The abdomen was irrigated once hemostasis was achieved.  The retroperitoneum was closed with running 2-0 Vicryl.  The small bowel was placed back into its anatomical position.  It was inspected and found to be without defect.  The transverse colon was then placed back to its anatomic position.  The midline abdominal fascia was closed with 2 running #1 PDS suture.  The subcutaneous tissue was closed with running 2-0 Vicryl and the skin was closed with 4-0 Vicryl.  The groins were then inspected.  They were both hemostatic.  The groins were closed by reapproximating the femoral sheath with 2-0 Vicryl, the subcutaneous tissue with 2 layers of 3-0 Vicryl, and the skin with 4-0 Vicryl.  Dermabond was placed on all the incisions.  The patient had multiphasic posterior tibial and anterior tibial signals in both legs at the level of the ankle.  The patient was then successfully extubated from anesthesia and taken to recovery room in stable condition.     Jorge Ny, MD     VWB/MEDQ  D:  05/15/2011  T:  05/16/2011  Job:  [id number]  Electronically Signed by Arelia Longest IV MD on 06/01/2011 11:03:01 PM

## 2011-06-01 NOTE — Discharge Summary (Signed)
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  HISTORY OF PRESENT ILLNESS:  This is a 65 year old female who was seen at the request of Dr. Allyson Sabal for bilateral lifestyle limiting claudication.  The patient states that approximately 50 feet her legs began to cramp and give out.  Her right leg is worse than her left. This has been going on for approximately 2 years and has been getting worse over the past year.  The patient states that rest does make it better but that these have severely altered her lifestyle.  She denies any rest pain or nonhealing wound.  The patient suffers from hypertension was medically managed.  She is also diet controlled for treating her hypercholesterolemia.  She smokes but she has cut back.  HOSPITAL COURSE:  The patient was admitted to the hospital and taken to the operating room on May 15, 2011, where she underwent aortobifemoral bypass graft on May 15, 2011.  She tolerated the procedure well and was transported to the recovery room in satisfactory condition.  By postoperative day #1, she was doing well.  On postoperative day #3, she was transferred to the telemetry floor.  She was hungry and had small bowel movements with continued flatus.  By postoperative day #4, she was doing very well.  She continued to have soft abdomen with flatus and tolerating her diet well.  Her hemoglobin has been stable from her surgical blood loss anemia but we will check one more before she leaves today and if that is stable she will be discharged home.  DISCHARGE INSTRUCTIONS:  She is discharged  home with extensive instructions on wound care and progressive ambulation.  She is instructed not to drive or perform any heavy lifting until returning to see Dr. Myra Gianotti in his office.  DISCHARGE DIAGNOSES: 1. Bilateral external iliac occlusion with lifestyle limiting     claudication.     a.     Status post aortobifemoral bypass graft May 15, 2011. 2. Hypertension. 3. Hypercholesterolemia. 4. History of breast cancer. 5. Peripheral vascular disease. 6. History of hysterectomy. 7. Multiple breast surgeries for breast cancer.  DISCHARGE MEDICATIONS: 1. Percocet one p.o. q.6 h. p.r.n. pain #30 no refill. 2. Advil 200 mg 2 tablets q.8 h. P.r.n. 3. Allegra one p.o. daily p.r.n. 4. Cyclobenzaprine 10 mg one p.o. daily p.r.n. 5. Desonide topical 5% cream one application daily p.r.n. 6. Diazepam 5 mg one p.o. q.6 h. p.r.n. 7. Fluconazole 50 mcg 1 spray daily p.r.n. 8. Hydrochlorothiazide 25 mg 1-1/2 tablet p.o. at bedtime. 9. Ketotifen ophthalmic solution 1 drop both eyes daily p.r.n. 10.Triamcinolone topical 1% cream daily p.r.n.  FOLLOWUP:  The patient is to follow up with Dr. Myra Gianotti in 2 weeks.     Newton Pigg, PA   ______________________________ V. Charlena Cross, MD  SE/MEDQ  D:  05/19/2011  T:  05/20/2011  Job:  [id number]  cc:   Nanetta Batty, M.D. Sharlot Gowda, M.D.  Electronically Signed by Newton Pigg PA on 05/20/2011 10:28:11 AM Electronically Signed by Arelia Longest IV MD on 06/01/2011 11:02:58 PM

## 2011-06-02 ENCOUNTER — Ambulatory Visit: Payer: Managed Care, Other (non HMO) | Admitting: Surgery

## 2011-06-02 LAB — URINALYSIS, ROUTINE W REFLEX MICROSCOPIC
Bilirubin Urine: NEGATIVE
Glucose, UA: NEGATIVE mg/dL
Leukocytes, UA: NEGATIVE
Nitrite: NEGATIVE
Specific Gravity, Urine: 1.005 (ref 1.005–1.030)
pH: 6.5 (ref 5.0–8.0)

## 2011-06-04 LAB — URINE CULTURE
Colony Count: >=100000
Culture  Setup Time: 201207161439
Special Requests: NEGATIVE

## 2011-06-23 ENCOUNTER — Ambulatory Visit (INDEPENDENT_AMBULATORY_CARE_PROVIDER_SITE_OTHER): Payer: Managed Care, Other (non HMO) | Admitting: Surgery

## 2011-06-23 ENCOUNTER — Encounter: Payer: Self-pay | Admitting: Surgery

## 2011-06-23 VITALS — BP 111/77 | HR 92 | Temp 98.0°F | Ht 63.0 in | Wt 146.0 lb

## 2011-06-23 DIAGNOSIS — I70219 Atherosclerosis of native arteries of extremities with intermittent claudication, unspecified extremity: Secondary | ICD-10-CM

## 2011-06-23 NOTE — Progress Notes (Signed)
Subjective:     Patient ID: [name], female   DOB: 09-25-1946, 65 y.o.   MRN: [id number]  HPI [name] is a very pleasant 65 year old female who is status post aorta bifemoral bypass graft for lifestyle limiting claudication on 05/15/2011. Her postoperative course was uncomplicated. She was seen in the office approximately 2 weeks later with a low-grade temperature of 99. She was thought to have had a pulmonary issue as she did not have urinary tract symptoms and was sent home. She came back in a week later with anorexia and nausea and abdominal pain with loose stools. She was admitted at the hospital. During her hospitalization, C. difficile was checked and was found to be negative. She did have a Escherichia coli urinary tract infection which was treated with Cipro. A CT scan was performed which was unremarkable except for moderate bilateral hydroureteral nephrosis thought to be related to inflammation where the ureters crossed over her graft. The CT scan was somewhat therapeutic.She did have a good bowel movement and her symptoms resolved. She was able to tolerate her diet and was discharged home. The patient is back today for followup. She still complains of some bloating and nausea which tends to lie proud. These last approximately 20-30 minutes. She has been taking a stool softener which does appear to improve her symptoms. She does state that it has been 10-12 days since she has had a good bowel movement. Her eating is better and she has not had significant weight loss.  Past Medical History  Diagnosis Date  . Hypertension   . Peripheral vascular disease   . Cancer     Right Breast  . Hyperlipidemia   . Seasonal allergies     History  Substance Use Topics  . Smoking status: Former Smoker -- 0.5 packs/day for 30 years  . Smokeless tobacco: Not on file  . Alcohol Use: No     occasional alcohol intake    Family History  Problem Relation Age of Onset  . Heart attack Sister   .  Heart attack Brother   . Stroke Sister     No Known Allergies  Current outpatient prescriptions:acetaminophen (TYLENOL) 325 MG tablet, Take 650 mg by mouth every 6 (six) hours as needed.  , Disp: , Rfl: ;  cyclobenzaprine (FLEXERIL) 10 MG tablet, Take 10 mg by mouth daily as needed.  , Disp: , Rfl: ;  diazepam (VALIUM) 5 MG tablet, Take 5 mg by mouth every 6 (six) hours as needed.  , Disp: , Rfl: ;  fexofenadine (ALLEGRA) 180 MG tablet, Take 180 mg by mouth daily.  , Disp: , Rfl:  hydrochlorothiazide 25 MG tablet, Take 25 mg by mouth daily. Take 1 1/2 tablets po q hs , Disp: , Rfl: ;  ibuprofen (ADVIL,MOTRIN) 200 MG tablet, Take 200 mg by mouth every 8 (eight) hours as needed.  , Disp: , Rfl: ;  ketotifen (ZADITOR) 0.025 % ophthalmic solution, 1 drop 2 (two) times daily. 1 gtt both eyes daily prn , Disp: , Rfl:  oxyCODONE-acetaminophen (PERCOCET) 5-325 MG per tablet, Take 1 tablet by mouth every 6 (six) hours as needed.  , Disp: , Rfl:   Filed Vitals:   06/23/11 0903  Height: 5\' 3"  (1.6 m)  Weight: 146 lb (66.225 kg)    Body mass index is 25.86 kg/(m^2).         Review of Systems  Constitutional: Positive for appetite change and fatigue. Negative for fever.  Cardiovascular: Negative for  leg swelling.  Gastrointestinal: Positive for abdominal pain. Negative for abdominal distention.  Genitourinary: Negative for dysuria.       Objective:   Physical Exam general: Well-appearing in no acute distress  Cardiovascular: Palpable femoral pulses.  Abdomen: Soft nontender midline incision is well-healed no hernias.     Assessment:    status post aortobifemoral bypass graft for lifestyle limiting claudication    Plan:       he patient has somewhat of a prolonged postoperative ileus. She has not had good results with Dulcolax suppository. She did not a Fleet enema last week however does not feel that she did this properly. I do think we need to get a good bowel cleanout. I'm giving  her a bottle of mag citrate to take him with her. I am also giving her Zofran to help with her nausea. Overall, the patient has improved from when she first we presented after operation however she has not yet back to her baseline. I am hopeful that with antinausea medicine and with a good bowel cleanout that this will help alleviate some of her symptoms. If not I would like to repeat her urinalysis as well as to repeat her CT scan. The patient will follow up with me in one week.

## 2011-06-30 ENCOUNTER — Ambulatory Visit (INDEPENDENT_AMBULATORY_CARE_PROVIDER_SITE_OTHER): Payer: Managed Care, Other (non HMO) | Admitting: Surgery

## 2011-06-30 ENCOUNTER — Encounter: Payer: Self-pay | Admitting: Surgery

## 2011-06-30 VITALS — BP 129/74 | HR 92 | Temp 98.1°F | Ht 63.0 in | Wt 146.0 lb

## 2011-06-30 DIAGNOSIS — I70219 Atherosclerosis of native arteries of extremities with intermittent claudication, unspecified extremity: Secondary | ICD-10-CM

## 2011-06-30 NOTE — Progress Notes (Signed)
[name] comes back today for followup. She is status post aortobifemoral bypass grafting for lifestyle limiting claudication on 05/15/2011 her initial postoperative course was uncomplicated. However she represented to the office with anorexia nausea and abdominal pain with loose stools. She was admitted to the hospital. During her hospitalization her C. difficile was negative. She did have an Escherichia coli urinary tract infection which was treated with Cipro. A CT scan showed moderate bilateral hydroureteral nephrosis thought to be secondary to inflammation where the ureters crossed over her graft. The CT scan was somewhat therapeutic and she was discharged home feeling pretty well. SR back one week ago and she was still complaining of some bloating and nausea. She had been several as she bent over a week without a bowel movement. I gave her a bottle of mag citrate and a prescription for oral Zofran. She states that she did have a bowel movement with the mag citrate however she still is wrestling with constipation. She has started taking fiber.  On examination her abdomen is soft her incision is well-healed there is no hernia she has palpable pedal pulses.  The patient is supposed to have a colonoscopy by Dr. Loreta Ave in the immediate future. I have recommended that she see Dr. Loreta Ave for evaluation of constipation and diarrhea in the postoperative setting. I would like to see the patient back in one month I am going to repeat her CT scan of the abdomen and pelvis to reevaluate the ureteral dilatation.

## 2011-07-01 ENCOUNTER — Other Ambulatory Visit: Payer: Self-pay | Admitting: Surgery

## 2011-07-01 DIAGNOSIS — I70219 Atherosclerosis of native arteries of extremities with intermittent claudication, unspecified extremity: Secondary | ICD-10-CM

## 2011-07-01 DIAGNOSIS — I739 Peripheral vascular disease, unspecified: Secondary | ICD-10-CM

## 2011-07-10 ENCOUNTER — Encounter: Payer: Self-pay | Admitting: Surgery

## 2011-07-16 ENCOUNTER — Other Ambulatory Visit: Payer: Self-pay | Admitting: Family Medicine

## 2011-07-16 NOTE — Discharge Summary (Signed)
NAME:  [name], [name]              ACCOUNT NO.:  [id number]  MEDICAL RECORD NO.:  [id number]  LOCATION:                                 FACILITY:  PHYSICIAN:  Juleen China IV, MDDATE OF BIRTH:  1946/07/16  DATE OF ADMISSION:  05/29/2011 DATE OF DISCHARGE:  06/03/2011                              DISCHARGE SUMMARY   ADMITTING DIAGNOSES:  Nausea, anorexia, and abdominal discomfort, status post aortobifemoral.  PAST MEDICAL HISTORY AND DISCHARGE DIAGNOSES: 1. Nausea, difficulty eating, and abdominal pain, status post recent     aortobifemoral with resolution of all issues. 2. Escherichia coli urinary tract infection. 3. Mild hydronephrosis noted on CT scan.  BRIEF HISTORY:  The patient is a 65 year old female who is recently status post aortobifemoral bypass grafting by Dr. Myra Gianotti who presented to the office with complaints of nausea and difficulty eating at home. She was also noted some diarrhea.  The patient was evaluated in the office by Dr. Darrick Penna and, secondary to these issues, was admitted for hydration with further evaluation and n.p.o. status.  HOSPITAL COURSE:  The patient was admitted as previously stated for n.p.o. status and hydration as well as observation secondary to abdominal pain and nausea with eating status post aortobifemoral bypass. The patient's hospital course progressed as expected.  Initially, she continued to complain of some nausea with eating and crampy lower abdominal pain, however, this resolved over several days.  She was initially kept n.p.o. and then her diet was started as her symptoms improved.  She was eventually tolerating regular diet prior to discharge.  The patient was able to ambulate without difficulty.  Her diarrhea resolved.  Her C. diff was negative.  She was found to have an E. coli urinary tract infection which was treated with Cipro.  CT scan  revealed no evidence of obstruction or ileus. Cholelithiasis was noted as well as  some mild hydronephrosis which was thought to be due to recent aortobifemoral bypass.  The patient continued to progress and on June 03, 2011 was without complaints. She was tolerating regular diet.  She was afebrile with stable vital signs.  PHYSICAL EXAMINATION:  CARDIAC:  Regular rate and rhythm. LUNGS:  Clear to auscultation. ABDOMEN:  Soft, nontender, and nondistended with active bowel sounds. SKIN:  Incisions were clean, dry, and intact and healing well. EXTREMITIES:  She has 2+ palpable dorsalis pedis bilaterally.  The patient was felt to be improved and in stable condition.  She was discharged home on Cipro for continued treatment of her urinary tract infection.  She also had an outpatient urology evaluation established for complete eval of hydronephrosis noted on her postop CT.  DISCHARGE INSTRUCTIONS:  The patient received specific written discharge instructions regarding diet, activity, and wound care.  She is to follow up with Dr. Myra Gianotti in 2 weeks.  She has an urology evaluation established and that office will contact the patient with day and time of that appointment.  DISCHARGE MEDICATIONS: 1. Advil 200 mg 2 p.o. q.8 p.r.n. 2. Allegra OTC 1 tablet p.o. daily p.r.n. 3. Cyclobenzaprine 10 mg 1 daily p.r.n. 4. Desonide 5% cream 1 application topical daily p.r.n. 5. Diazepam 5 mg 1 p.o. q.6  p.r.n. 6. Fluticasone 50 mcg 1 spray nasally daily p.r.n. 7. Hydrochlorothiazide 25 mg 1/2 tablet p.o. daily. 8. Ketotifen ophthalmic solution 1 drop both eyes daily p.r.n.. 9. Oxycodone/acetaminophen 5/325 mg 1 p.o. q.6 p.r.n. 10.Triamcinolone cream 1% topically daily. 11.Tylenol 650 mg 2 tablets p.o. q.6 p.r.n.     Pecola Leisure, PA   ______________________________ V. Charlena Cross, MD    AY/MEDQ  D:  06/18/2011  T:  06/18/2011  Job:  [id number]  Electronically Signed by Pecola Leisure PA on 06/24/2011 02:05:35 PM Electronically Signed by Arelia Longest IV MD on  07/16/2011 11:50:54 PM

## 2011-08-12 ENCOUNTER — Encounter: Payer: Self-pay | Admitting: Family Medicine

## 2011-08-13 ENCOUNTER — Ambulatory Visit (INDEPENDENT_AMBULATORY_CARE_PROVIDER_SITE_OTHER): Payer: Managed Care, Other (non HMO) | Admitting: Family Medicine

## 2011-08-13 ENCOUNTER — Encounter: Payer: Self-pay | Admitting: Family Medicine

## 2011-08-13 VITALS — BP 110/74 | HR 64 | Ht 63.0 in | Wt 149.0 lb

## 2011-08-13 DIAGNOSIS — I739 Peripheral vascular disease, unspecified: Secondary | ICD-10-CM

## 2011-08-13 DIAGNOSIS — Z0000 Encounter for general adult medical examination without abnormal findings: Secondary | ICD-10-CM

## 2011-08-13 DIAGNOSIS — Z853 Personal history of malignant neoplasm of breast: Secondary | ICD-10-CM

## 2011-08-13 DIAGNOSIS — J301 Allergic rhinitis due to pollen: Secondary | ICD-10-CM

## 2011-08-13 LAB — POCT URINALYSIS DIPSTICK
Bilirubin, UA: NEGATIVE
Blood, UA: POSITIVE
Glucose, UA: NEGATIVE
Leukocytes, UA: NEGATIVE
Nitrite, UA: NEGATIVE
Urobilinogen, UA: NEGATIVE

## 2011-08-13 NOTE — Progress Notes (Signed)
  Subjective:    Patient ID: [name], female    DOB: March 14, 1946, 65 y.o.   MRN: [id number]  HPI She is here for an interval evaluation. Since last being seen she had an extensive evaluation for peripheral vascular disease and indeed did have surgery. At this time she seems to be doing well but is having some slight abdominal distress. She is being followed by her oncologist and plans to see him in the near future. Her allergies are under good control. She has no other concerns or complaints.   Review of Systems  Constitutional: Negative.   HENT: Negative.   Eyes: Negative.   Respiratory: Negative.   Cardiovascular: Negative.   Gastrointestinal: Negative.   Genitourinary: Negative.   Musculoskeletal: Negative.   Skin: Negative.   Neurological: Negative.   Hematological: Negative.        Objective:   Physical Exam BP 110/74  Pulse 64  Ht 5\' 3"  (1.6 m)  Wt 149 lb (67.586 kg)  BMI 26.39 kg/m2  General Appearance:    Alert, cooperative, no distress, appears stated age  Head:    Normocephalic, without obvious abnormality, atraumatic  Eyes:    PERRL, conjunctiva/corneas clear, EOM's intact, fundi    benign  Ears:    Normal TM's and external ear canals  Nose:   Nares normal, mucosa normal, no drainage or sinus   tenderness  Throat:   Lips, mucosa, and tongue normal; teeth and gums normal  Neck:   Supple, no lymphadenopathy;  thyroid:  no   enlargement/tenderness/nodules; no carotid   bruit or JVD  Back:    Spine nontender, no curvature, ROM normal, no CVA     tenderness  Lungs:     Clear to auscultation bilaterally without wheezes, rales or     ronchi; respirations unlabored  Chest Wall:    No tenderness or deformity   Heart:    Regular rate and rhythm, S1 and S2 normal, no murmur, rub   or gallop  Breast Exam:    Deferred to GYN  Abdomen:     Soft, non-tender, nondistended, normoactive bowel sounds,    no masses, no hepatosplenomegaly.incision seems to be healing well     Genitalia:    Deferred to GYN     Extremities:   No clubbing, cyanosis or edema  Pulses:   2+ and symmetric all extremities  Skin:   Skin color, texture, turgor normal, no rashes or lesions  Lymph nodes:   Cervical, supraclavicular, and axillary nodes normal  Neurologic:   CNII-XII intact, normal strength, sensation and gait; reflexes 2+ and symmetric throughout          Psych:   Normal mood, affect, hygiene and grooming.             Assessment & Plan:   1. PVD (peripheral vascular disease)    2. History of breast cancer    3. Allergic rhinitis due to pollen    4. General medical examination  POCT Urinalysis Dipstick, Visual acuity screening   No blood work was drawn since she was recently in the hospital.

## 2011-08-13 NOTE — Patient Instructions (Signed)
Keep working on the smoking to quit entirely. Call me if you need help

## 2011-08-13 NOTE — Progress Notes (Signed)
  Subjective:    Patient ID: [name], female    DOB: [birth year], 65 y.o.   MRN: [id number]  HPI    Review of Systems     Objective:   Physical Exam The urine microscopic was negative for red cells       Assessment & Plan:

## 2011-08-15 ENCOUNTER — Encounter: Payer: Self-pay | Admitting: Surgery

## 2011-08-18 ENCOUNTER — Encounter: Payer: Self-pay | Admitting: Surgery

## 2011-08-18 ENCOUNTER — Ambulatory Visit
Admission: RE | Admit: 2011-08-18 | Discharge: 2011-08-18 | Disposition: A | Discharge status: Home / Self Care | Payer: Managed Care, Other (non HMO) | Source: Ambulatory Visit | Attending: Surgery | Admitting: Surgery

## 2011-08-18 ENCOUNTER — Ambulatory Visit (INDEPENDENT_AMBULATORY_CARE_PROVIDER_SITE_OTHER): Payer: Managed Care, Other (non HMO) | Admitting: Surgery

## 2011-08-18 VITALS — BP 137/83 | HR 81 | Resp 20 | Ht 63.0 in | Wt 152.9 lb

## 2011-08-18 DIAGNOSIS — I739 Peripheral vascular disease, unspecified: Secondary | ICD-10-CM

## 2011-08-18 DIAGNOSIS — I70219 Atherosclerosis of native arteries of extremities with intermittent claudication, unspecified extremity: Secondary | ICD-10-CM

## 2011-08-18 MED ORDER — IOHEXOL 300 MG/ML  SOLN
100.0000 mL | Freq: Once | INTRAMUSCULAR | Status: AC | PRN
  Administered 2011-08-18: 100 mL via INTRAVENOUS

## 2011-08-18 NOTE — Progress Notes (Signed)
[facility]   Patient name: [name] MRN: [id number] DOB: Dec 09, 1945 Sex: female     Chief Complaint  Patient presents with  . Follow-up    S/P AOBIFEM BP 04/2011; 1 MO F/U W/ CT ABD/PELVIS TO EVAL URETERAL DILATATION    HISTORY OF PRESENT ILLNESS: The patient is back today for followup. She underwent aortobifemoral bypass graft for lifestyle limiting claudication on 05/15/2011. Her initial postoperative course was uncomplicated however she represented to the office with anorexia nausea and abdominal pain with loose stools. She was admitted to the hospital. The only thing which showed up as a urinary tract infection. She did have a CT scan which showed moderate bilateral hydroureteral nephrosis. She's continued to complain of bloating and trouble with her bowel movements. Today she states that she is doing much better. Has no complaints  Past Medical History  Diagnosis Date  . Hypertension   . Peripheral vascular disease   . Cancer     Right Breast  . Hyperlipidemia   . Seasonal allergies     Past Surgical History  Procedure Date  . Abdominal hysterectomy   . Vascular surgery 05/15/11    aobifemoral bypass graft  . Breast surgery     right lumpectomy  . Breast surgery     multiple breast surgeries  . Aortobifemoral bpg 05/15/11    Using a 14 x8 bifurcated Dacron Graft    History   Social History  . Marital Status: Married    Spouse Name: N/A    Number of Children: N/A  . Years of Education: N/A   Occupational History  . Not on file.   Social History Main Topics  . Smoking status: Current Some Day Smoker -- 0.3 packs/day for 30 years  . Smokeless tobacco: Not on file  . Alcohol Use: No     occasional alcohol intake  . Drug Use: No  . Sexually Active: Not on file   Other Topics Concern  . Not on file   Social History Narrative  . No narrative on file    Family History  Problem Relation Age of Onset  . Heart attack  Sister   . Heart attack Brother   . Stroke Sister   . Cancer Mother     BREAST, BRAIN  . Heart disease Mother     Allergies as of 08/18/2011  . (No Known Allergies)    Current Outpatient Prescriptions on File Prior to Visit  Medication Sig Dispense Refill  . acetaminophen (TYLENOL) 325 MG tablet Take 650 mg by mouth every 6 (six) hours as needed.        . cyclobenzaprine (FLEXERIL) 10 MG tablet Take 10 mg by mouth daily as needed.        . desonide (DESOWEN) 0.05 % cream Apply 1 application topically 2 (two) times daily.        . diazepam (VALIUM) 5 MG tablet Take 5 mg by mouth every 6 (six) hours as needed.        . fexofenadine (ALLEGRA) 180 MG tablet Take 180 mg by mouth daily.        . hydrochlorothiazide 25 MG tablet take 1/2 tablet by mouth once daily  30 tablet  6  . ibuprofen (ADVIL,MOTRIN) 200 MG tablet Take 200 mg by mouth every 8 (eight) hours as needed.        [name] ketotifen (ZADITOR) 0.025 % ophthalmic solution 1 drop 2 (two) times daily. 1 gtt both eyes daily  prn        No current facility-administered medications on file prior to visit.     REVIEW OF SYSTEMS: No change  PHYSICAL EXAMINATION: General: The patient appears their stated age.  Vital signs are BP 137/83  Pulse 81  Resp 20  Ht 5\' 3"  (1.6 m)  Wt 152 lb 14.4 oz (69.355 kg)  BMI 27.09 kg/m2 Pulmonary: There is a good air exchange bilaterally without wheezing or rales. Abdomen: Soft and non-tender with normal pitch bowel sounds. No hernia Musculoskeletal: There are no major deformities.  There is no significant extremity pain. Neurologic: No focal weakness or paresthesias are detected, Skin: There are no ulcer or rashes noted. Psychiatric: The patient has normal affect. Cardiovascular: There is a regular rate and rhythm without significant murmur appreciated.   Diagnostic Studies CT scan today shows no acute abnormality her hydronephrosis has resolved  Assessment: Status post aortobifemoral bypass  graft Plan: The patient started to improve traumatically. She is walking without difficulty she denies having any abdominal pain. She feels that she is much better today. I plan on seeing her back in 3 months.  Jorge Ny, M.D. [facility] Office: [phone]

## 2011-08-19 ENCOUNTER — Other Ambulatory Visit: Payer: Self-pay | Admitting: Family Medicine

## 2011-08-19 NOTE — Telephone Encounter (Signed)
DR.LALONDE IS THIS OK 

## 2011-09-03 ENCOUNTER — Other Ambulatory Visit: Payer: Self-pay | Admitting: Oncology

## 2011-09-03 ENCOUNTER — Encounter (HOSPITAL_BASED_OUTPATIENT_CLINIC_OR_DEPARTMENT_OTHER): Payer: Managed Care, Other (non HMO) | Admitting: Oncology

## 2011-09-03 DIAGNOSIS — C50419 Malignant neoplasm of upper-outer quadrant of unspecified female breast: Secondary | ICD-10-CM

## 2011-09-03 DIAGNOSIS — Z853 Personal history of malignant neoplasm of breast: Secondary | ICD-10-CM

## 2011-09-03 DIAGNOSIS — I10 Essential (primary) hypertension: Secondary | ICD-10-CM

## 2011-09-03 DIAGNOSIS — F17200 Nicotine dependence, unspecified, uncomplicated: Secondary | ICD-10-CM

## 2011-09-03 LAB — COMPREHENSIVE METABOLIC PANEL
Albumin: 3.7 g/dL (ref 3.5–5.2)
CO2: 28 mEq/L (ref 19–32)
Calcium: 9.2 mg/dL (ref 8.4–10.5)
Glucose, Bld: 95 mg/dL (ref 70–99)
Potassium: 3.8 mEq/L (ref 3.5–5.3)
Sodium: 139 mEq/L (ref 135–145)
Total Bilirubin: 0.3 mg/dL (ref 0.3–1.2)
Total Protein: 7.3 g/dL (ref 6.0–8.3)

## 2011-09-03 LAB — CBC WITH DIFFERENTIAL/PLATELET
Basophils Absolute: 0 10*3/uL (ref 0.0–0.1)
Eosinophils Absolute: 0.2 10*3/uL (ref 0.0–0.5)
HGB: 12.4 g/dL (ref 11.6–15.9)
MONO#: 0.4 10*3/uL (ref 0.1–0.9)
MONO%: 7 % (ref 0.0–14.0)
NEUT#: 2.4 10*3/uL (ref 1.5–6.5)
RBC: 4.3 10*6/uL (ref 3.70–5.45)
RDW: 16.4 % — ABNORMAL HIGH (ref 11.2–14.5)
WBC: 5.8 10*3/uL (ref 3.9–10.3)
lymph#: 2.8 10*3/uL (ref 0.9–3.3)

## 2011-09-05 LAB — HM COLONOSCOPY: HM Colonoscopy: NORMAL

## 2011-11-03 ENCOUNTER — Telehealth: Payer: Self-pay

## 2011-11-03 NOTE — Telephone Encounter (Signed)
Pt. called on 12/14 with c/o abdominal swelling 2 navel and about 2-3 inches to right of navel.  Denies redness or warmth @ site; denies fever.  Describes a discomfort of "burning sensation @ navel area".  States it is firm.  Denies any nausea/vomiting  or change in bowel pattern.  States the swelling / firmness has been there approx. 3 wks.  This AM discussed w/ Dr. Myra Gianotti, and received recommendation to schedule office visit w/ himself a PA in the next 2 weeks.  Voice msg. Left for pt. that office will call to schedule appt. the week of 12/31, and to call office if sx's worsen prior to appt.

## 2011-11-14 ENCOUNTER — Encounter: Payer: Self-pay | Admitting: Surgery

## 2011-11-17 ENCOUNTER — Encounter: Payer: Self-pay | Admitting: Surgery

## 2011-11-17 ENCOUNTER — Ambulatory Visit (INDEPENDENT_AMBULATORY_CARE_PROVIDER_SITE_OTHER): Payer: Managed Care, Other (non HMO) | Admitting: Surgery

## 2011-11-17 DIAGNOSIS — R109 Unspecified abdominal pain: Secondary | ICD-10-CM | POA: Insufficient documentation

## 2011-11-17 DIAGNOSIS — I739 Peripheral vascular disease, unspecified: Secondary | ICD-10-CM

## 2011-11-17 NOTE — Progress Notes (Signed)
[facility] [place]   Patient name: [name] MRN: [id number] DOB: 09-10-1946 Sex: female     Chief Complaint  Patient presents with  . Abdominal Pain    irritation 4-6 weeks duration    HISTORY OF PRESENT ILLNESS: The patient comes in today with new complaints of pain in her right midline. She is status post aortobifemoral bypass grafting approximately 6 months ago. For the past several weeks she's been having discomfort and the area to the right of her navel. She comes in today as an unscheduled visit. She denies fevers, changes in her bowel habits, or blood in her stool. Her claudication symptoms have resolved.  Past Medical History  Diagnosis Date  . Hypertension   . Peripheral vascular disease   . Cancer     Right Breast  . Hyperlipidemia   . Seasonal allergies     Past Surgical History  Procedure Date  . Abdominal hysterectomy   . Vascular surgery 05/15/11    aobifemoral bypass graft  . Breast surgery     right lumpectomy  . Breast surgery     multiple breast surgeries  . Aortobifemoral bpg 05/15/11    Using a 14 x8 bifurcated Dacron Graft    History   Social History  . Marital Status: Married    Spouse Name: N/A    Number of Children: N/A  . Years of Education: N/A   Occupational History  . Not on file.   Social History Main Topics  . Smoking status: Current Some Day Smoker -- 0.3 packs/day for 30 years  . Smokeless tobacco: Not on file  . Alcohol Use: No     occasional alcohol intake  . Drug Use: No  . Sexually Active: Not on file   Other Topics Concern  . Not on file   Social History Narrative  . No narrative on file    Family History  Problem Relation Age of Onset  . Heart attack Sister   . Heart attack Brother   . Stroke Sister   . Cancer Mother     BREAST, BRAIN  . Heart disease Mother     Allergies as of 11/17/2011  . (No Known Allergies)    Current Outpatient Prescriptions on File Prior to Visit    Medication Sig Dispense Refill  . acetaminophen (TYLENOL) 325 MG tablet Take 650 mg by mouth every 6 (six) hours as needed.        . cyclobenzaprine (FLEXERIL) 10 MG tablet Take 10 mg by mouth daily as needed.        . desonide (DESOWEN) 0.05 % cream Apply 1 application topically 2 (two) times daily.        . fexofenadine (ALLEGRA) 180 MG tablet Take 180 mg by mouth daily.        . hydrochlorothiazide 25 MG tablet take 1/2 tablet by mouth once daily  30 tablet  6  . ketotifen (ZADITOR) 0.025 % ophthalmic solution 1 drop 2 (two) times daily. 1 gtt both eyes daily prn       . diazepam (VALIUM) 5 MG tablet Take 5 mg by mouth every 6 (six) hours as needed.        [name] ibuprofen (ADVIL,MOTRIN) 200 MG tablet Take 200 mg by mouth every 8 (eight) hours as needed.           REVIEW OF SYSTEMS: Cardiovascular: No chest pain, chest pressure, palpitations, orthopnea, or dyspnea on exertion. No claudication or rest pain,  No history of DVT or phlebitis. Pulmonary: No productive cough, asthma or wheezing. Neurologic: No weakness, paresthesias, aphasia, or amaurosis. No dizziness. Hematologic: No bleeding problems or clotting disorders. Musculoskeletal: No joint pain or joint swelling. Gastrointestinal: No blood in stool or hematemesis Genitourinary: No dysuria or hematuria. Psychiatric:: No history of major depression. Integumentary: No rashes or ulcers. Constitutional: No fever or chills.  PHYSICAL EXAMINATION:   Vital signs are BP 133/81  Pulse 85  Resp 16  Ht 5\' 3"  (1.6 m)  Wt 154 lb (69.854 kg)  BMI 27.28 kg/m2  SpO2 99% General: The patient appears their stated age. HEENT:  No gross abnormalities Pulmonary:  Non labored breathing Abdomen: Soft and non-tender there is a small defect to the right of her umbilicus consistent with hernia. It is reducible. Musculoskeletal: There are no major deformities. Neurologic: No focal weakness or paresthesias are detected, Skin: There are no ulcer or  rashes noted. Psychiatric: The patient has normal affect. Cardiovascular: Palpable pedal pulses   Diagnostic Studies None today  Assessment: Status post aortobifemoral bypass graft with an incisional hernia Plan: I am going to send the patient to Gen. surgery for evaluation and treatment of this hernia. She does not wish to have this fixed at this time however I would like for them to educate her further on this. I discussed the signs and symptoms of an incarcerated hernia. She will keep her irregular scheduled visits with me. She is supposed to have an ultrasound in one week and I will repeat her studies in one year  V. Charlena Cross, M.D. [facility] [place] Office: [phone] Pager:  [phone]

## 2011-11-21 ENCOUNTER — Encounter: Payer: Self-pay | Admitting: Surgery

## 2011-11-24 ENCOUNTER — Ambulatory Visit (INDEPENDENT_AMBULATORY_CARE_PROVIDER_SITE_OTHER): Payer: Medicare Other | Admitting: Vascular Surgery

## 2011-11-24 ENCOUNTER — Ambulatory Visit: Payer: Medicare Other | Admitting: Surgery

## 2011-11-24 DIAGNOSIS — I739 Peripheral vascular disease, unspecified: Secondary | ICD-10-CM

## 2011-11-24 DIAGNOSIS — Z48812 Encounter for surgical aftercare following surgery on the circulatory system: Secondary | ICD-10-CM

## 2011-11-24 NOTE — Progress Notes (Signed)
ABI performed @ [facility] 11/24/2011

## 2011-12-03 ENCOUNTER — Ambulatory Visit (INDEPENDENT_AMBULATORY_CARE_PROVIDER_SITE_OTHER): Payer: Medicare Other | Admitting: Surgery

## 2011-12-03 ENCOUNTER — Encounter (INDEPENDENT_AMBULATORY_CARE_PROVIDER_SITE_OTHER): Payer: Self-pay | Admitting: Surgery

## 2011-12-03 DIAGNOSIS — F17200 Nicotine dependence, unspecified, uncomplicated: Secondary | ICD-10-CM

## 2011-12-03 DIAGNOSIS — K432 Incisional hernia without obstruction or gangrene: Secondary | ICD-10-CM

## 2011-12-03 DIAGNOSIS — Z720 Tobacco use: Secondary | ICD-10-CM

## 2011-12-03 NOTE — Patient Instructions (Signed)
Hernia A hernia occurs when an internal organ pushes out through a weak spot in the abdominal wall. Hernias most commonly occur in the groin and around the navel. Hernias often can be pushed back into place (reduced). Most hernias tend to get worse over time. Some abdominal hernias can get stuck in the opening (irreducible or incarcerated hernia) and cannot be reduced. An irreducible abdominal hernia which is tightly squeezed into the opening is at risk for impaired blood supply (strangulated hernia). A strangulated hernia is a medical emergency. Because of the risk for an irreducible or strangulated hernia, surgery may be recommended to repair a hernia. CAUSES   Heavy lifting.   Prolonged coughing.   Straining to have a bowel movement.   A cut (incision) made during an abdominal surgery.  HOME CARE INSTRUCTIONS   Bed rest is not required. You may continue your normal activities.   Avoid lifting more than 10 pounds (4.5 kg) or straining.   Cough gently. If you are a smoker it is best to stop. Even the best hernia repair can break down with the continual strain of coughing. Even if you do not have your hernia repaired, a cough will continue to aggravate the problem.   Do not wear anything tight over your hernia. Do not try to keep it in with an outside bandage or truss. These can damage abdominal contents if they are trapped within the hernia sac.   Eat a normal diet.   Avoid constipation. Straining over long periods of time will increase hernia size and encourage breakdown of repairs. If you cannot do this with diet alone, stool softeners may be used.  SEEK IMMEDIATE MEDICAL CARE IF:   You have a fever.   You develop increasing abdominal pain.   You feel nauseous or vomit.   Your hernia is stuck outside the abdomen, looks discolored, feels hard, or is tender.   You have any changes in your bowel habits or in the hernia that are unusual for you.   You have increased pain or  swelling around the hernia.   You cannot push the hernia back in place by applying gentle pressure while lying down.  MAKE SURE YOU:   Understand these instructions.   Will watch your condition.   Will get help right away if you are not doing well or get worse.  Document Released: 11/03/2005 Document Revised: 07/16/2011 Document Reviewed: 06/22/2008 [facility] Patient Information 2012 [place], [place].  Smoking Cessation, Tips for Success YOU CAN QUIT SMOKING If you are ready to quit smoking, congratulations! You have chosen to help yourself be healthier. Cigarettes bring nicotine, tar, carbon monoxide, and other irritants into your body. Your lungs, heart, and blood vessels will be able to work better without these poisons. There are many different ways to quit smoking. Nicotine gum, nicotine patches, a nicotine inhaler, or nicotine nasal spray can help with physical craving. Hypnosis, support groups, and medicines help break the habit of smoking. Here are some tips to help you quit for good.  Throw away all cigarettes.   Clean and remove all ashtrays from your home, work, and car.   On a card, write down your reasons for quitting. Carry the card with you and read it when you get the urge to smoke.   Cleanse your body of nicotine. Drink enough water and fluids to keep your urine clear or pale yellow. Do this after quitting to flush the nicotine from your body.   Learn to predict your moods.  Do not let a bad situation be your excuse to have a cigarette. Some situations in your life might tempt you into wanting a cigarette.   Never have "just one" cigarette. It leads to wanting another and another. Remind yourself of your decision to quit.   Change habits associated with smoking. If you smoked while driving or when feeling stressed, try other activities to replace smoking. Stand up when drinking your coffee. Brush your teeth after eating. Sit in a different chair when you read the paper.  Avoid alcohol while trying to quit, and try to drink fewer caffeinated beverages. Alcohol and caffeine may urge you to smoke.   Avoid foods and drinks that can trigger a desire to smoke, such as sugary or spicy foods and alcohol.   Ask people who smoke not to smoke around you.   Have something planned to do right after eating or having a cup of coffee. Take a walk or exercise to perk you up. This will help to keep you from overeating.   Try a relaxation exercise to calm you down and decrease your stress. Remember, you may be tense and nervous for the first 2 weeks after you quit, but this will pass.   Find new activities to keep your hands busy. Play with a pen, coin, or rubber band. Doodle or draw things on paper.   Brush your teeth right after eating. This will help cut down on the craving for the taste of tobacco after meals. You can try mouthwash, too.   Use oral substitutes, such as lemon drops, carrots, a cinnamon stick, or chewing gum, in place of cigarettes. Keep them handy so they are available when you have the urge to smoke.   When you have the urge to smoke, try deep breathing.   Designate your home as a nonsmoking area.   If you are a heavy smoker, ask your caregiver about a prescription for nicotine chewing gum. It can ease your withdrawal from nicotine.   Reward yourself. Set aside the cigarette money you save and buy yourself something nice.   Look for support from others. Join a support group or smoking cessation program. Ask someone at home or at work to help you with your plan to quit smoking.   Always ask yourself, "Do I need this cigarette or is this just a reflex?" Tell yourself, "Today, I choose not to smoke," or "I do not want to smoke." You are reminding yourself of your decision to quit, even if you do smoke a cigarette.  HOW WILL I FEEL WHEN I QUIT SMOKING?  The benefits of not smoking start within days of quitting.   You may have symptoms of withdrawal  because your body is used to nicotine (the addictive substance in cigarettes). You may crave cigarettes, be irritable, feel very hungry, cough often, get headaches, or have difficulty concentrating.   The withdrawal symptoms are only temporary. They are strongest when you first quit but will go away within 10 to 14 days.   When withdrawal symptoms occur, stay in control. Think about your reasons for quitting. Remind yourself that these are signs that your body is healing and getting used to being without cigarettes.   Remember that withdrawal symptoms are easier to treat than the major diseases that smoking can cause.   Even after the withdrawal is over, expect periodic urges to smoke. However, these cravings are generally short-lived and will go away whether you smoke or not. Do not smoke!  If you relapse and smoke again, do not lose hope. Most smokers quit 3 times before they are successful.   If you relapse, do not give up! Plan ahead and think about what you will do the next time you get the urge to smoke.  LIFE AS A NONSMOKER: MAKE IT FOR A MONTH, MAKE IT FOR LIFE Day 1: Hang this page where you will see it every day. Day 2: Get rid of all ashtrays, matches, and lighters. Day 3: Drink water. Breathe deeply between sips. Day 4: Avoid places with smoke-filled air, such as bars, clubs, or the smoking section of restaurants. Day 5: Keep track of how much money you save by not smoking. Day 6: Avoid boredom. Keep a good book with you or go to the movies. Day 7: Reward yourself! One week without smoking! Day 8: Make a dental appointment to get your teeth cleaned. Day 9: Decide how you will turn down a cigarette before it is offered to you. Day 10: Review your reasons for quitting. Day 11: Distract yourself. Stay active to keep your mind off smoking and to relieve tension. Take a walk, exercise, read a book, do a crossword puzzle, or try a new hobby. Day 12: Exercise. Get off the bus before  your stop or use stairs instead of escalators. Day 13: Call on friends for support and encouragement. Day 14: Reward yourself! Two weeks without smoking! Day 15: Practice deep breathing exercises. Day 16: Bet a friend that you can stay a nonsmoker. Day 17: Ask to sit in nonsmoking sections of restaurants. Day 18: Hang up "No Smoking" signs. Day 19: Think of yourself as a nonsmoker. Day 20: Each morning, tell yourself you will not smoke. Day 21: Reward yourself! Three weeks without smoking! Day 22: Think of smoking in negative ways. Remember how it stains your teeth, gives you bad breath, and leaves you short of breath. Day 23: Eat a nutritious breakfast. Day 24:Do not relive your days as a smoker. Day 25: Hold a pencil in your hand when talking on the telephone. Day 26: Tell all your friends you do not smoke. Day 27: Think about how much better food tastes. Day 28: Remember, one cigarette is one too many. Day 29: Take up a hobby that will keep your hands busy. Day 30: Congratulations! One month without smoking! Give yourself a big reward. Your caregiver can direct you to community resources or hospitals for support, which may include:  Group support.   Education.   Hypnosis.   Subliminal therapy.  Document Released: 08/01/2004 Document Revised: 07/16/2011 Document Reviewed: 08/20/2009 [facility] Patient Information 2012 [place], [place].

## 2011-12-03 NOTE — Progress Notes (Signed)
Subjective:     Patient ID: [name], female   DOB: 08/16/1946, 66 y.o.   MRN: [id number]  HPI  [name]  [birth year] [id number]  Patient Care Team: Carollee Herter, MD as PCP - General (Family Medicine) Jethro Bolus, MD (Internal Medicine) Charna Elizabeth, MD (Gastroenterology) Runell Gess, MD (Cardiology)  This patient is a 66 y.o.female who presents today for surgical evaluation at the request of Dr. Myra Gianotti.  Reason for visit: Probable incisional ventral hernia  The patient is a pleasant overweight smoking female. She required major aortic surgery in June 2012. It sounds like she had moderate ileus but eventually recovered. She noticed a lump around her bellybutton about a month ago. It is worse and more sensitive, especially when she coughs or rubs up against it. No constipation or diarrhea. No nausea or vomiting.   She saw her vascular surgeon. Dr. Myra Gianotti was concerned for a hernia and sent the patient to [place]. She is reluctant to have surgery. She comes today with her husband.  She can walk a half-mile or she starts feeling some crampiness in her calves. Regular bowel movements. No fevers or chills. She is down to a few cigarettes a day.   Patient Active Problem List  Diagnoses  . PVD (peripheral vascular disease)  . History of breast cancer  . Allergic rhinitis due to pollen  . Abdominal pain  . Incisional hernia, periumbilical  . Tobacco abuse    Past Medical History  Diagnosis Date  . Hypertension   . Peripheral vascular disease   . Cancer     Right Breast  . Hyperlipidemia   . Seasonal allergies   . Abdominal wall hernia     Past Surgical History  Procedure Date  . Abdominal hysterectomy   . Vascular surgery 05/15/11    aobifemoral bypass graft  . Breast surgery     right lumpectomy  . Breast surgery     multiple breast surgeries  . Aortobifemoral bpg 05/15/11    Using a 14 x8 bifurcated Dacron Graft    History   Social History  .  Marital Status: Married    Spouse Name: N/A    Number of Children: N/A  . Years of Education: N/A   Occupational History  . Not on file.   Social History Main Topics  . Smoking status: Current Some Day Smoker -- 0.3 packs/day for 30 years  . Smokeless tobacco: Never Used  . Alcohol Use: No     occasional alcohol intake  . Drug Use: No  . Sexually Active: Not on file   Other Topics Concern  . Not on file   Social History Narrative  . No narrative on file    Family History  Problem Relation Age of Onset  . Heart attack Sister   . Heart attack Brother   . Stroke Sister   . Cancer Mother     BREAST, BRAIN  . Heart disease Mother     Current outpatient prescriptions:acetaminophen (TYLENOL) 325 MG tablet, Take 650 mg by mouth every 6 (six) hours as needed.  , Disp: , Rfl: ;  cyclobenzaprine (FLEXERIL) 10 MG tablet, Take 10 mg by mouth daily as needed.  , Disp: , Rfl: ;  desonide (DESOWEN) 0.05 % cream, Apply 1 application topically 2 (two) times daily.  , Disp: , Rfl: ;  fexofenadine (ALLEGRA) 180 MG tablet, Take 180 mg by mouth daily.  , Disp: , Rfl:  hydrochlorothiazide 25 MG tablet, take  1/2 tablet by mouth once daily, Disp: 30 tablet, Rfl: 6;  ketotifen (ZADITOR) 0.025 % ophthalmic solution, 1 drop 2 (two) times daily. 1 gtt both eyes daily prn , Disp: , Rfl:   No Known Allergies  BP 122/68  Pulse 70  Temp(Src) 98.2 F (36.8 C) (Temporal)  Resp 16  Ht 5\' 3"  (1.6 m)  Wt 154 lb 3.2 oz (69.945 kg)  BMI 27.32 kg/m2     Review of Systems  Constitutional: Negative for fever, chills, diaphoresis, appetite change and fatigue.  HENT: Negative for ear pain, sore throat, trouble swallowing, neck pain and ear discharge.   Eyes: Negative for photophobia, discharge and visual disturbance.  Respiratory: Negative for cough, choking, chest tightness and shortness of breath.   Cardiovascular: Negative for chest pain, palpitations and leg swelling.       Mild claudication after  walking 0.5 mile  Gastrointestinal: Positive for abdominal pain. Negative for nausea, vomiting, diarrhea, constipation, blood in stool, abdominal distention, anal bleeding and rectal pain.  Genitourinary: Negative for dysuria, frequency and difficulty urinating.  Musculoskeletal: Negative for myalgias and gait problem.  Skin: Negative for color change, pallor and rash.  Neurological: Negative for dizziness, speech difficulty, weakness and numbness.  Hematological: Negative for adenopathy.  Psychiatric/Behavioral: Negative for confusion and agitation. The patient is not nervous/anxious.        Objective:   Physical Exam  Constitutional: She is oriented to person, place, and time. She appears well-developed and well-nourished. No distress.  HENT:  Head: Normocephalic.  Mouth/Throat: Oropharynx is clear and moist. No oropharyngeal exudate.  Eyes: Conjunctivae and EOM are normal. Pupils are equal, round, and reactive to light. No scleral icterus.  Neck: Normal range of motion. Neck supple. No tracheal deviation present.  Cardiovascular: Normal rate, regular rhythm and intact distal pulses.   Pulmonary/Chest: Effort normal and breath sounds normal. No respiratory distress. She exhibits no tenderness.  Abdominal: Soft. She exhibits no distension. There is tenderness. There is no rebound and no guarding. Hernia confirmed negative in the right inguinal area and confirmed negative in the left inguinal area.    Genitourinary: No vaginal discharge found.  Musculoskeletal: Normal range of motion. She exhibits no tenderness.  Lymphadenopathy:    She has no cervical adenopathy.       Right: No inguinal adenopathy present.       Left: No inguinal adenopathy present.  Neurological: She is alert and oriented to person, place, and time. No cranial nerve deficit. She exhibits normal muscle tone. Coordination normal.  Skin: Skin is warm and dry. No rash noted. She is not diaphoretic. No erythema.    Psychiatric: She has a normal mood and affect. Her behavior is normal. Judgment and thought content normal.       Assessment:     VWH, incisional x1 (and prob swiss cheese areas also)    Plan:     I think she needs surgery. It is the size where incarceration risk is highest. She has probably multiple locations. I did warn her she will have pain and discomfort for the first few months but it gradually shall improve. Therefore, I think diagnostic laparoscopy and lysis of adhesions will be helpful in mapping out where the repair needs to be.  I strongly recommended she stopped smoking as well. After deliberating she and her husband agree to proceed with surgery. I did discuss the procedure:  The anatomy & physiology of the abdominal wall was discussed.  The pathophysiology of hernias was discussed.  Natural history risks without surgery of enlargement, pain, incarceration & strangulation was discussed.   Contributors to complications such as smoking, obesity, diabetes, prior surgery, etc were discussed.  I feel the risks of no intervention will lead to serious problems that outweigh the operative risks; therefore, I recommended surgery to reduce and repair the hernia.  I explained laparoscopic techniques with possible need for an open approach.  I noted the probable use of mesh to patch and/or buttress hernia repair  Risks such as bleeding, infection, abscess, need for further treatment, heart attack, death, and other risks were discussed.  I noted a good likelihood this will help address the problem.   Goals of post-operative recovery were discussed as well.  Possibility that this will not correct all symptoms was explained.  I stressed the importance of low-impact activity, aggressive pain control, avoiding constipation, & not pushing through pain to minimize risk of post-operative chronic pain or injury. Possibility of reherniation was discussed.  We will work to minimize complications.   An  educational handout further explaining the pathology & treatment options was given as well.  Questions were answered.  The patient expresses understanding & wishes to proceed with surgery.  We talked to the patient about the dangers of smoking.  We stressed that tobacco use dramatically increases the risk of peri-operative complications such as infection, tissue necrosis leaving to problems with incision/wound and organ healing, heart attack, stroke, DVT, pulmonary embolism, and death.  We noted there are programs in our community to help stop smoking.

## 2011-12-11 ENCOUNTER — Encounter (HOSPITAL_COMMUNITY): Payer: Self-pay

## 2011-12-19 ENCOUNTER — Telehealth (INDEPENDENT_AMBULATORY_CARE_PROVIDER_SITE_OTHER): Payer: Self-pay

## 2011-12-19 ENCOUNTER — Encounter (HOSPITAL_COMMUNITY)
Admission: RE | Admit: 2011-12-19 | Discharge: 2011-12-19 | Disposition: A | Discharge status: Home / Self Care | Payer: Medicare Other | Source: Ambulatory Visit | Attending: Surgery | Admitting: Surgery

## 2011-12-19 ENCOUNTER — Encounter (HOSPITAL_COMMUNITY): Payer: Self-pay

## 2011-12-19 ENCOUNTER — Other Ambulatory Visit (INDEPENDENT_AMBULATORY_CARE_PROVIDER_SITE_OTHER): Payer: Self-pay | Admitting: Surgery

## 2011-12-19 HISTORY — DX: Encounter for other specified aftercare: Z51.89

## 2011-12-19 HISTORY — DX: Reserved for inherently not codable concepts without codable children: IMO0001

## 2011-12-19 LAB — CBC
HCT: 37.2 % (ref 36.0–46.0)
MCV: 86.9 fL (ref 78.0–100.0)
Platelets: 194 10*3/uL (ref 150–400)
RBC: 4.28 MIL/uL (ref 3.87–5.11)
RDW: 15.7 % — ABNORMAL HIGH (ref 11.5–15.5)
WBC: 5.6 10*3/uL (ref 4.0–10.5)

## 2011-12-19 LAB — BASIC METABOLIC PANEL
CO2: 31 mEq/L (ref 19–32)
Chloride: 100 mEq/L (ref 96–112)
Creatinine, Ser: 0.82 mg/dL (ref 0.50–1.10)
GFR calc Af Amer: 85 mL/min — ABNORMAL LOW (ref >90)
Potassium: 3.4 mEq/L — ABNORMAL LOW (ref 3.5–5.1)

## 2011-12-19 LAB — SURGICAL PCR SCREEN: Staphylococcus aureus: NEGATIVE

## 2011-12-19 MED ORDER — POTASSIUM CHLORIDE ER 10 MEQ PO TBCR: 20.0000 meq | EXTENDED_RELEASE_TABLET | Freq: Two times a day (BID) | ORAL | Status: DC

## 2011-12-19 NOTE — Patient Instructions (Signed)
20 [name]  12/19/2011   Your procedure is scheduled on:  Friday 12/26/2011  Report to [facility] at 0740 AM.  Call this number if you have problems the morning of surgery: [phone]   Remember:   Do not eat food:After Midnight.  May have clear liquids:until Midnight .  Clear liquids include soda, tea, black coffee, apple or grape juice, broth.  Take these medicines the morning of surgery with A SIP OF WATER: None   Do not wear jewelry, make-up or nail polish.  Do not wear lotions, powders, or perfumes.   Do not shave 48 hours prior to surgery.(women only-shaving legs)  Do not bring valuables to the hospital.  Contacts, dentures or bridgework may not be worn into surgery.  Leave suitcase in the car. After surgery it may be brought to your room.  For patients admitted to the hospital, checkout time is 11:00 AM the day of discharge.   Patients discharged the day of surgery will not be allowed to drive home.  Name and phone number of your driver:   Special Instructions: CHG Shower Use Special Wash: 1/2 bottle night before surgery and 1/2 bottle morning of surgery.   Please read over the following fact sheets that you were given: MRSA Information

## 2011-12-19 NOTE — Pre-Procedure Instructions (Addendum)
Left message for Ethlyn Gallery to let Dr. Michaell Cowing know about abnormal labs-K-3.4 and low GFR.

## 2011-12-19 NOTE — Telephone Encounter (Signed)
Calling to notify Dr Michaell Cowing that b-met was abnormal with K+ 3.4. The pt has surgery scheduled for 12-26-11 lap ventral wall hernia with mesh. Please advise.

## 2011-12-24 ENCOUNTER — Telehealth (INDEPENDENT_AMBULATORY_CARE_PROVIDER_SITE_OTHER): Payer: Self-pay

## 2011-12-24 NOTE — Telephone Encounter (Signed)
Called pt to notify her that her labs from preop were showing potassium to be low at 3.4. Per Dr Michaell Cowing for pt to take K-Dur BID x1wk and get a potassuim checked again at the day of surgery. The pt understands.

## 2011-12-24 NOTE — Telephone Encounter (Signed)
Notified RN that Dr Michaell Cowing prescribed K-Dur BIDx1wk and need potassium checked on arrival. The pt has been made aware.

## 2011-12-26 ENCOUNTER — Encounter (HOSPITAL_COMMUNITY): Payer: Self-pay | Admitting: *Deleted

## 2011-12-26 ENCOUNTER — Other Ambulatory Visit (INDEPENDENT_AMBULATORY_CARE_PROVIDER_SITE_OTHER): Payer: Self-pay | Admitting: Surgery

## 2011-12-26 ENCOUNTER — Ambulatory Visit (HOSPITAL_COMMUNITY): Payer: Medicare Other | Admitting: Anesthesiology

## 2011-12-26 ENCOUNTER — Inpatient Hospital Stay (HOSPITAL_COMMUNITY)
Admission: AD | Admit: 2011-12-26 | Discharge: 2011-12-29 | DRG: 336 | Disposition: A | Discharge status: Home / Self Care | Payer: Medicare Other | Source: Ambulatory Visit | Attending: Surgery | Admitting: Surgery

## 2011-12-26 ENCOUNTER — Encounter (HOSPITAL_COMMUNITY): Payer: Self-pay | Admitting: Anesthesiology

## 2011-12-26 ENCOUNTER — Encounter (HOSPITAL_COMMUNITY)
Admission: AD | Disposition: A | Discharge status: Home / Self Care | Payer: Self-pay | Source: Ambulatory Visit | Attending: Surgery

## 2011-12-26 DIAGNOSIS — K429 Umbilical hernia without obstruction or gangrene: Secondary | ICD-10-CM | POA: Diagnosis present

## 2011-12-26 DIAGNOSIS — D200 Benign neoplasm of soft tissue of retroperitoneum: Secondary | ICD-10-CM | POA: Diagnosis present

## 2011-12-26 DIAGNOSIS — J301 Allergic rhinitis due to pollen: Secondary | ICD-10-CM | POA: Insufficient documentation

## 2011-12-26 DIAGNOSIS — Z720 Tobacco use: Secondary | ICD-10-CM | POA: Insufficient documentation

## 2011-12-26 DIAGNOSIS — I10 Essential (primary) hypertension: Secondary | ICD-10-CM | POA: Insufficient documentation

## 2011-12-26 DIAGNOSIS — F17200 Nicotine dependence, unspecified, uncomplicated: Secondary | ICD-10-CM | POA: Diagnosis present

## 2011-12-26 DIAGNOSIS — I739 Peripheral vascular disease, unspecified: Secondary | ICD-10-CM | POA: Diagnosis present

## 2011-12-26 DIAGNOSIS — K432 Incisional hernia without obstruction or gangrene: Secondary | ICD-10-CM

## 2011-12-26 DIAGNOSIS — K430 Incisional hernia with obstruction, without gangrene: Principal | ICD-10-CM | POA: Diagnosis present

## 2011-12-26 DIAGNOSIS — K560 Paralytic ileus: Secondary | ICD-10-CM | POA: Diagnosis not present

## 2011-12-26 HISTORY — PX: VENTRAL HERNIA REPAIR: SHX424

## 2011-12-26 LAB — POTASSIUM: Potassium: 4 mEq/L (ref 3.5–5.1)

## 2011-12-26 LAB — CREATININE, SERUM
GFR calc Af Amer: >90 mL/min (ref >90)
GFR calc non Af Amer: 86 mL/min — ABNORMAL LOW (ref >90)

## 2011-12-26 SURGERY — REPAIR, HERNIA, VENTRAL, LAPAROSCOPIC
Anesthesia: General | Site: Abdomen | Wound class: Clean

## 2011-12-26 MED ORDER — STERILE WATER FOR IRRIGATION IR SOLN
Status: DC | PRN
  Administered 2011-12-26: 1500 mL

## 2011-12-26 MED ORDER — NEOSTIGMINE METHYLSULFATE 1 MG/ML IJ SOLN
INTRAMUSCULAR | Status: DC | PRN
  Administered 2011-12-26: 1 mg via INTRAVENOUS

## 2011-12-26 MED ORDER — LIDOCAINE HCL (CARDIAC) 20 MG/ML IV SOLN
INTRAVENOUS | Status: DC | PRN
  Administered 2011-12-26: 75 mg via INTRAVENOUS

## 2011-12-26 MED ORDER — 0.9 % SODIUM CHLORIDE (POUR BTL) OPTIME
TOPICAL | Status: DC | PRN
  Administered 2011-12-26: 1000 mL

## 2011-12-26 MED ORDER — PROMETHAZINE HCL 25 MG/ML IJ SOLN: 6.2500 mg | INTRAMUSCULAR | Status: DC | PRN

## 2011-12-26 MED ORDER — HYDROMORPHONE HCL PF 1 MG/ML IJ SOLN
0.5000 mg | INTRAMUSCULAR | Status: DC | PRN
  Administered 2011-12-26: 1 mg via INTRAVENOUS
  Filled 2011-12-26: qty 1

## 2011-12-26 MED ORDER — DOCUSATE SODIUM 100 MG PO CAPS
100.0000 mg | ORAL_CAPSULE | ORAL | Status: DC
  Administered 2011-12-27: 100 mg via ORAL
  Filled 2011-12-26: qty 1

## 2011-12-26 MED ORDER — BISACODYL 10 MG RE SUPP: 10.0000 mg | Freq: Two times a day (BID) | RECTAL | Status: DC | PRN

## 2011-12-26 MED ORDER — LORATADINE 10 MG PO TABS
10.0000 mg | ORAL_TABLET | Freq: Every day | ORAL | Status: DC
  Administered 2011-12-28: 10 mg via ORAL
  Filled 2011-12-26 (×4): qty 1

## 2011-12-26 MED ORDER — FENTANYL CITRATE 0.05 MG/ML IJ SOLN: 25.0000 ug | INTRAMUSCULAR | Status: DC | PRN

## 2011-12-26 MED ORDER — ONDANSETRON HCL 4 MG/2ML IJ SOLN: 4.0000 mg | Freq: Four times a day (QID) | INTRAMUSCULAR | Status: DC | PRN

## 2011-12-26 MED ORDER — PROMETHAZINE HCL 25 MG/ML IJ SOLN
12.5000 mg | Freq: Four times a day (QID) | INTRAMUSCULAR | Status: DC | PRN
  Administered 2011-12-26: 12.5 mg via INTRAVENOUS
  Filled 2011-12-26: qty 1

## 2011-12-26 MED ORDER — DEXAMETHASONE SODIUM PHOSPHATE 10 MG/ML IJ SOLN
INTRAMUSCULAR | Status: DC | PRN
  Administered 2011-12-26: 10 mg via INTRAVENOUS

## 2011-12-26 MED ORDER — LACTATED RINGERS IV SOLN: INTRAVENOUS | Status: DC

## 2011-12-26 MED ORDER — KETOTIFEN FUMARATE 0.025 % OP SOLN: 1.0000 [drp] | Freq: Two times a day (BID) | OPHTHALMIC | Status: DC | PRN

## 2011-12-26 MED ORDER — ALUM & MAG HYDROXIDE-SIMETH 200-200-20 MG/5ML PO SUSP: 30.0000 mL | Freq: Four times a day (QID) | ORAL | Status: DC | PRN

## 2011-12-26 MED ORDER — FLORA-Q PO CAPS
1.0000 | ORAL_CAPSULE | ORAL | Status: DC
  Administered 2011-12-26 – 2011-12-29 (×2): 1 via ORAL
  Filled 2011-12-26 (×2): qty 1

## 2011-12-26 MED ORDER — OXYCODONE HCL 5 MG PO TABS
5.0000 mg | ORAL_TABLET | ORAL | Status: DC | PRN
  Administered 2011-12-27: 5 mg via ORAL
  Administered 2011-12-28: 10 mg via ORAL
  Filled 2011-12-26: qty 1
  Filled 2011-12-26: qty 2

## 2011-12-26 MED ORDER — ACETAMINOPHEN 10 MG/ML IV SOLN
INTRAVENOUS | Status: DC | PRN
  Administered 2011-12-26: 1000 mg via INTRAVENOUS

## 2011-12-26 MED ORDER — ACETAMINOPHEN 325 MG PO TABS
650.0000 mg | ORAL_TABLET | Freq: Four times a day (QID) | ORAL | Status: DC
  Administered 2011-12-26 – 2011-12-27 (×4): 650 mg via ORAL
  Filled 2011-12-26 (×4): qty 2

## 2011-12-26 MED ORDER — LACTATED RINGERS IV SOLN
INTRAVENOUS | Status: DC
  Administered 2011-12-26: 1000 mL via INTRAVENOUS

## 2011-12-26 MED ORDER — ONDANSETRON HCL 4 MG PO TABS
4.0000 mg | ORAL_TABLET | Freq: Four times a day (QID) | ORAL | Status: DC | PRN
  Administered 2011-12-28: 4 mg via ORAL
  Filled 2011-12-26: qty 1

## 2011-12-26 MED ORDER — ONDANSETRON HCL 4 MG/2ML IJ SOLN
INTRAMUSCULAR | Status: DC | PRN
  Administered 2011-12-26 (×2): 2 mg via INTRAVENOUS

## 2011-12-26 MED ORDER — BUPIVACAINE 0.25 % ON-Q PUMP DUAL CATH 300 ML
300.0000 mL | INJECTION | Status: DC
  Filled 2011-12-26: qty 300

## 2011-12-26 MED ORDER — HYDROCHLOROTHIAZIDE 25 MG PO TABS
25.0000 mg | ORAL_TABLET | Freq: Every day | ORAL | Status: DC
  Administered 2011-12-26 – 2011-12-29 (×4): 25 mg via ORAL
  Filled 2011-12-26 (×5): qty 1

## 2011-12-26 MED ORDER — ONDANSETRON 8 MG/NS 50 ML IVPB
8.0000 mg | Freq: Four times a day (QID) | INTRAVENOUS | Status: DC | PRN
  Filled 2011-12-26: qty 8

## 2011-12-26 MED ORDER — HEPARIN SODIUM (PORCINE) 5000 UNIT/ML IJ SOLN
5000.0000 [IU] | Freq: Three times a day (TID) | INTRAMUSCULAR | Status: DC
  Administered 2011-12-27 – 2011-12-28 (×5): 5000 [IU] via SUBCUTANEOUS
  Filled 2011-12-26 (×13): qty 1

## 2011-12-26 MED ORDER — ALIGN 4 MG PO CAPS: 1.0000 | ORAL_CAPSULE | ORAL | Status: DC

## 2011-12-26 MED ORDER — ASPIRIN EC 81 MG PO TBEC
81.0000 mg | DELAYED_RELEASE_TABLET | Freq: Every day | ORAL | Status: DC
  Administered 2011-12-27 – 2011-12-29 (×3): 81 mg via ORAL
  Filled 2011-12-26 (×4): qty 1

## 2011-12-26 MED ORDER — LACTATED RINGERS IV SOLN
INTRAVENOUS | Status: DC | PRN
  Administered 2011-12-26: 10:00:00 via INTRAVENOUS

## 2011-12-26 MED ORDER — CEFAZOLIN SODIUM 1-5 GM-% IV SOLN
1.0000 g | INTRAVENOUS | Status: AC
  Administered 2011-12-26: 1 g via INTRAVENOUS

## 2011-12-26 MED ORDER — BUPIVACAINE-EPINEPHRINE 0.25% -1:200000 IJ SOLN
INTRAMUSCULAR | Status: DC | PRN
  Administered 2011-12-26: 50 mL

## 2011-12-26 MED ORDER — LACTATED RINGERS IV SOLN
INTRAVENOUS | Status: DC
  Administered 2011-12-26 – 2011-12-27 (×3): via INTRAVENOUS
  Administered 2011-12-27: 75 mL via INTRAVENOUS

## 2011-12-26 MED ORDER — KETOROLAC TROMETHAMINE 30 MG/ML IJ SOLN
INTRAMUSCULAR | Status: DC | PRN
  Administered 2011-12-26: 30 mg via INTRAVENOUS

## 2011-12-26 MED ORDER — BUPIVACAINE 0.25 % ON-Q PUMP DUAL CATH 300 ML
300.0000 mL | INJECTION | Status: DC
  Administered 2011-12-26: 300 mL
  Filled 2011-12-26: qty 300

## 2011-12-26 MED ORDER — POTASSIUM CHLORIDE ER 10 MEQ PO TBCR
20.0000 meq | EXTENDED_RELEASE_TABLET | Freq: Two times a day (BID) | ORAL | Status: DC
  Administered 2011-12-26 – 2011-12-29 (×6): 20 meq via ORAL
  Filled 2011-12-26 (×9): qty 2

## 2011-12-26 MED ORDER — DIPHENHYDRAMINE HCL 50 MG/ML IJ SOLN: 12.5000 mg | Freq: Four times a day (QID) | INTRAMUSCULAR | Status: DC | PRN

## 2011-12-26 MED ORDER — HYDROMORPHONE BOLUS VIA INFUSION: 0.5000 mg | INTRAVENOUS | Status: DC | PRN

## 2011-12-26 MED ORDER — MIDAZOLAM HCL 5 MG/5ML IJ SOLN
INTRAMUSCULAR | Status: DC | PRN
  Administered 2011-12-26: 1 mg via INTRAVENOUS

## 2011-12-26 MED ORDER — PSYLLIUM 95 % PO PACK
1.0000 | PACK | Freq: Two times a day (BID) | ORAL | Status: DC
  Administered 2011-12-26 – 2011-12-29 (×6): 1 via ORAL
  Filled 2011-12-26 (×9): qty 1

## 2011-12-26 MED ORDER — LIP MEDEX EX OINT
1.0000 "application " | TOPICAL_OINTMENT | Freq: Two times a day (BID) | CUTANEOUS | Status: DC
  Administered 2011-12-27 – 2011-12-29 (×5): 1 via TOPICAL
  Filled 2011-12-26: qty 7

## 2011-12-26 MED ORDER — HYDROMORPHONE HCL PF 1 MG/ML IJ SOLN
0.5000 mg | INTRAMUSCULAR | Status: AC | PRN
  Administered 2011-12-26 (×2): 0.5 mg via INTRAVENOUS

## 2011-12-26 MED ORDER — GLYCOPYRROLATE 0.2 MG/ML IJ SOLN
INTRAMUSCULAR | Status: DC | PRN
  Administered 2011-12-26: .2 mg via INTRAVENOUS

## 2011-12-26 MED ORDER — ALBUTEROL SULFATE (5 MG/ML) 0.5% IN NEBU: 2.5000 mg | INHALATION_SOLUTION | Freq: Four times a day (QID) | RESPIRATORY_TRACT | Status: DC | PRN

## 2011-12-26 MED ORDER — NICOTINE 14 MG/24HR TD PT24
14.0000 mg | MEDICATED_PATCH | Freq: Every day | TRANSDERMAL | Status: DC
  Filled 2011-12-26 (×5): qty 1

## 2011-12-26 MED ORDER — ROCURONIUM BROMIDE 100 MG/10ML IV SOLN
INTRAVENOUS | Status: DC | PRN
  Administered 2011-12-26: 10 mg via INTRAVENOUS
  Administered 2011-12-26: 40 mg via INTRAVENOUS

## 2011-12-26 MED ORDER — METOPROLOL TARTRATE 12.5 MG HALF TABLET
12.5000 mg | ORAL_TABLET | Freq: Two times a day (BID) | ORAL | Status: DC | PRN
  Filled 2011-12-26: qty 1

## 2011-12-26 MED ORDER — LACTATED RINGERS IR SOLN
Status: DC | PRN
  Administered 2011-12-26: 1000 mL

## 2011-12-26 MED ORDER — PROPOFOL 10 MG/ML IV EMUL
INTRAVENOUS | Status: DC | PRN
  Administered 2011-12-26: 150 mg via INTRAVENOUS

## 2011-12-26 MED ORDER — TRIAMCINOLONE ACETONIDE 0.1 % EX CREA
1.0000 "application " | TOPICAL_CREAM | Freq: Three times a day (TID) | CUTANEOUS | Status: DC | PRN
  Filled 2011-12-26: qty 15

## 2011-12-26 MED ORDER — FENTANYL CITRATE 0.05 MG/ML IJ SOLN
INTRAMUSCULAR | Status: DC | PRN
  Administered 2011-12-26 (×2): 50 ug via INTRAVENOUS
  Administered 2011-12-26 (×2): 100 ug via INTRAVENOUS
  Administered 2011-12-26: 50 ug via INTRAVENOUS
  Administered 2011-12-26: 100 ug via INTRAVENOUS

## 2011-12-26 SURGICAL SUPPLY — 49 items
APPLIER CLIP 5 13 M/L LIGAMAX5 (MISCELLANEOUS)
BINDER ABD UNIV 12 45-62 (WOUND CARE) ×2 IMPLANT
BINDER ABDOMINAL 46IN 62IN (WOUND CARE) ×3
CANISTER SUCTION 2500CC (MISCELLANEOUS) ×3 IMPLANT
CATH KIT ON-Q SILVERSOAKER 10 (CATHETERS) ×6 IMPLANT
CLIP APPLIE 5 13 M/L LIGAMAX5 (MISCELLANEOUS) IMPLANT
CLOSURE STERI STRIP 1/2 X4 (GAUZE/BANDAGES/DRESSINGS) ×3 IMPLANT
CLOTH BEACON ORANGE TIMEOUT ST (SAFETY) ×3 IMPLANT
COVER SURGICAL LIGHT HANDLE (MISCELLANEOUS) ×3 IMPLANT
DECANTER SPIKE VIAL GLASS SM (MISCELLANEOUS) ×3 IMPLANT
DEVICE SECURE STRAP 25 ABSORB (INSTRUMENTS) ×6 IMPLANT
DEVICE TROCAR PUNCTURE CLOSURE (ENDOMECHANICALS) IMPLANT
DRAPE LAPAROSCOPIC ABDOMINAL (DRAPES) ×3 IMPLANT
DRSG TEGADERM 2-3/8X2-3/4 SM (GAUZE/BANDAGES/DRESSINGS) ×15 IMPLANT
DRSG TEGADERM 4X4.75 (GAUZE/BANDAGES/DRESSINGS) ×6 IMPLANT
ELECT REM PT RETURN 9FT ADLT (ELECTROSURGICAL) ×3
ELECTRODE REM PT RTRN 9FT ADLT (ELECTROSURGICAL) ×2 IMPLANT
FILTER SMOKE EVAC LAPAROSHD (FILTER) IMPLANT
GAUZE SPONGE 2X2 8PLY STRL LF (GAUZE/BANDAGES/DRESSINGS) ×2 IMPLANT
GLOVE ECLIPSE 8.0 STRL XLNG CF (GLOVE) ×3 IMPLANT
GLOVE INDICATOR 8.0 STRL GRN (GLOVE) ×6 IMPLANT
GOWN STRL NON-REIN LRG LVL3 (GOWN DISPOSABLE) ×3 IMPLANT
GOWN STRL REIN XL XLG (GOWN DISPOSABLE) ×6 IMPLANT
HAND ACTIVATED (MISCELLANEOUS) IMPLANT
KIT BASIN OR (CUSTOM PROCEDURE TRAY) ×3 IMPLANT
MESH PARIETEX 12X8 (Mesh General) ×3 IMPLANT
NEEDLE SPNL 22GX3.5 QUINCKE BK (NEEDLE) IMPLANT
NS IRRIG 1000ML POUR BTL (IV SOLUTION) ×3 IMPLANT
PEN SKIN MARKING BROAD (MISCELLANEOUS) ×3 IMPLANT
PENCIL BUTTON HOLSTER BLD 10FT (ELECTRODE) IMPLANT
SCISSORS LAP 5X35 DISP (ENDOMECHANICALS) ×3 IMPLANT
SET IRRIG TUBING LAPAROSCOPIC (IRRIGATION / IRRIGATOR) IMPLANT
SLEEVE Z-THREAD 5X100MM (TROCAR) ×6 IMPLANT
SPONGE GAUZE 2X2 STER 10/PKG (GAUZE/BANDAGES/DRESSINGS) ×1
STRIP CLOSURE SKIN 1/2X4 (GAUZE/BANDAGES/DRESSINGS) IMPLANT
SUT MNCRL AB 4-0 PS2 18 (SUTURE) ×3 IMPLANT
SUT PROLENE 1 CT 1 30 (SUTURE) IMPLANT
SUT PROLENE 1 CTX 30  8455H (SUTURE) ×8
SUT PROLENE 1 CTX 30 8455H (SUTURE) ×16 IMPLANT
SUT VIC AB 2-0 UR6 27 (SUTURE) IMPLANT
TOWEL OR 17X26 10 PK STRL BLUE (TOWEL DISPOSABLE) ×3 IMPLANT
TRAY FOLEY CATH 14FRSI W/METER (CATHETERS) IMPLANT
TRAY LAP CHOLE (CUSTOM PROCEDURE TRAY) ×3 IMPLANT
TROCAR BLADELESS OPT 5 75 (ENDOMECHANICALS) ×3 IMPLANT
TROCAR XCEL BLADELESS 5X75MML (TROCAR) ×3 IMPLANT
TROCAR Z-THREAD FIOS 11X100 BL (TROCAR) ×3 IMPLANT
TROCAR Z-THREAD FIOS 5X100MM (TROCAR) ×3 IMPLANT
TUBING INSUFFLATION 10FT LAP (TUBING) ×3 IMPLANT
TUNNELER SHEATH ON-Q 16GX12 DP (PAIN MANAGEMENT) ×3 IMPLANT

## 2011-12-26 NOTE — H&P (View-Only) (Signed)
Subjective:     Patient ID: [name], female   DOB: 11/22/1945, 66 y.o.   MRN: [id number]  HPI  [name]  11/22/1945 [id number]  Patient Care Team: John Charles Lalonde, MD as PCP - General (Family Medicine) Huan Ha, MD (Internal Medicine) Jyothi Mann, MD (Gastroenterology) Jonathan J Berry, MD (Cardiology)  This patient is a 66 y.o.female who presents today for surgical evaluation at the request of Dr. Brabham.  Reason for visit: Probable incisional ventral hernia  The patient is a pleasant overweight smoking female. She required major aortic surgery in June 2012. It sounds like she had moderate ileus but eventually recovered. She noticed a lump around her bellybutton about a month ago. It is worse and more sensitive, especially when she coughs or rubs up against it. No constipation or diarrhea. No nausea or vomiting.   She saw her vascular surgeon. Dr. Brabham was concerned for a hernia and sent the patient to us. She is reluctant to have surgery. She comes today with her husband.  She can walk a half-mile or she starts feeling some crampiness in her calves. Regular bowel movements. No fevers or chills. She is down to a few cigarettes a day.   Patient Active Problem List  Diagnoses  . PVD (peripheral vascular disease)  . History of breast cancer  . Allergic rhinitis due to pollen  . Abdominal pain  . Incisional hernia, periumbilical  . Tobacco abuse    Past Medical History  Diagnosis Date  . Hypertension   . Peripheral vascular disease   . Cancer     Right Breast  . Hyperlipidemia   . Seasonal allergies   . Abdominal wall hernia     Past Surgical History  Procedure Date  . Abdominal hysterectomy   . Vascular surgery 05/15/11    aobifemoral bypass graft  . Breast surgery     right lumpectomy  . Breast surgery     multiple breast surgeries  . Aortobifemoral bpg 05/15/11    Using a 14 x8 bifurcated Dacron Graft    History   Social History  .  Marital Status: Married    Spouse Name: N/A    Number of Children: N/A  . Years of Education: N/A   Occupational History  . Not on file.   Social History Main Topics  . Smoking status: Current Some Day Smoker -- 0.3 packs/day for 30 years  . Smokeless tobacco: Never Used  . Alcohol Use: No     occasional alcohol intake  . Drug Use: No  . Sexually Active: Not on file   Other Topics Concern  . Not on file   Social History Narrative  . No narrative on file    Family History  Problem Relation Age of Onset  . Heart attack Sister   . Heart attack Brother   . Stroke Sister   . Cancer Mother     BREAST, BRAIN  . Heart disease Mother     Current outpatient prescriptions:acetaminophen (TYLENOL) 325 MG tablet, Take 650 mg by mouth every 6 (six) hours as needed.  , Disp: , Rfl: ;  cyclobenzaprine (FLEXERIL) 10 MG tablet, Take 10 mg by mouth daily as needed.  , Disp: , Rfl: ;  desonide (DESOWEN) 0.05 % cream, Apply 1 application topically 2 (two) times daily.  , Disp: , Rfl: ;  fexofenadine (ALLEGRA) 180 MG tablet, Take 180 mg by mouth daily.  , Disp: , Rfl:  hydrochlorothiazide 25 MG tablet, take   1/2 tablet by mouth once daily, Disp: 30 tablet, Rfl: 6;  ketotifen (ZADITOR) 0.025 % ophthalmic solution, 1 drop 2 (two) times daily. 1 gtt both eyes daily prn , Disp: , Rfl:   No Known Allergies  BP 122/68  Pulse 70  Temp(Src) 98.2 F (36.8 C) (Temporal)  Resp 16  Ht 5' 3" (1.6 m)  Wt 154 lb 3.2 oz (69.945 kg)  BMI 27.32 kg/m2     Review of Systems  Constitutional: Negative for fever, chills, diaphoresis, appetite change and fatigue.  HENT: Negative for ear pain, sore throat, trouble swallowing, neck pain and ear discharge.   Eyes: Negative for photophobia, discharge and visual disturbance.  Respiratory: Negative for cough, choking, chest tightness and shortness of breath.   Cardiovascular: Negative for chest pain, palpitations and leg swelling.       Mild claudication after  walking 0.5 mile  Gastrointestinal: Positive for abdominal pain. Negative for nausea, vomiting, diarrhea, constipation, blood in stool, abdominal distention, anal bleeding and rectal pain.  Genitourinary: Negative for dysuria, frequency and difficulty urinating.  Musculoskeletal: Negative for myalgias and gait problem.  Skin: Negative for color change, pallor and rash.  Neurological: Negative for dizziness, speech difficulty, weakness and numbness.  Hematological: Negative for adenopathy.  Psychiatric/Behavioral: Negative for confusion and agitation. The patient is not nervous/anxious.        Objective:   Physical Exam  Constitutional: She is oriented to person, place, and time. She appears well-developed and well-nourished. No distress.  HENT:  Head: Normocephalic.  Mouth/Throat: Oropharynx is clear and moist. No oropharyngeal exudate.  Eyes: Conjunctivae and EOM are normal. Pupils are equal, round, and reactive to light. No scleral icterus.  Neck: Normal range of motion. Neck supple. No tracheal deviation present.  Cardiovascular: Normal rate, regular rhythm and intact distal pulses.   Pulmonary/Chest: Effort normal and breath sounds normal. No respiratory distress. She exhibits no tenderness.  Abdominal: Soft. She exhibits no distension. There is tenderness. There is no rebound and no guarding. Hernia confirmed negative in the right inguinal area and confirmed negative in the left inguinal area.    Genitourinary: No vaginal discharge found.  Musculoskeletal: Normal range of motion. She exhibits no tenderness.  Lymphadenopathy:    She has no cervical adenopathy.       Right: No inguinal adenopathy present.       Left: No inguinal adenopathy present.  Neurological: She is alert and oriented to person, place, and time. No cranial nerve deficit. She exhibits normal muscle tone. Coordination normal.  Skin: Skin is warm and dry. No rash noted. She is not diaphoretic. No erythema.    Psychiatric: She has a normal mood and affect. Her behavior is normal. Judgment and thought content normal.       Assessment:     VWH, incisional x1 (and prob swiss cheese areas also)    Plan:     I think she needs surgery. It is the size where incarceration risk is highest. She has probably multiple locations. I did warn her she will have pain and discomfort for the first few months but it gradually shall improve. Therefore, I think diagnostic laparoscopy and lysis of adhesions will be helpful in mapping out where the repair needs to be.  I strongly recommended she stopped smoking as well. After deliberating she and her husband agree to proceed with surgery. I did discuss the procedure:  The anatomy & physiology of the abdominal wall was discussed.  The pathophysiology of hernias was discussed.    Natural history risks without surgery of enlargement, pain, incarceration & strangulation was discussed.   Contributors to complications such as smoking, obesity, diabetes, prior surgery, etc were discussed.  I feel the risks of no intervention will lead to serious problems that outweigh the operative risks; therefore, I recommended surgery to reduce and repair the hernia.  I explained laparoscopic techniques with possible need for an open approach.  I noted the probable use of mesh to patch and/or buttress hernia repair  Risks such as bleeding, infection, abscess, need for further treatment, heart attack, death, and other risks were discussed.  I noted a good likelihood this will help address the problem.   Goals of post-operative recovery were discussed as well.  Possibility that this will not correct all symptoms was explained.  I stressed the importance of low-impact activity, aggressive pain control, avoiding constipation, & not pushing through pain to minimize risk of post-operative chronic pain or injury. Possibility of reherniation was discussed.  We will work to minimize complications.   An  educational handout further explaining the pathology & treatment options was given as well.  Questions were answered.  The patient expresses understanding & wishes to proceed with surgery.  We talked to the patient about the dangers of smoking.  We stressed that tobacco use dramatically increases the risk of peri-operative complications such as infection, tissue necrosis leaving to problems with incision/wound and organ healing, heart attack, stroke, DVT, pulmonary embolism, and death.  We noted there are programs in our community to help stop smoking.       

## 2011-12-26 NOTE — Interval H&P Note (Signed)
History and Physical Interval Note:  12/26/2011 9:27 AM  [name]  has presented today for surgery, with the diagnosis of ventral wall hernia   The various methods of treatment have been discussed with the patient and family. After consideration of risks, benefits and other options for treatment, the patient has consented to  Procedure(s): LAPAROSCOPIC VENTRAL HERNIA as a surgical intervention .  The patients' history has been reviewed, patient examined, no change in status, stable for surgery.  I have reviewed the patients' chart and labs.  Questions were answered to the patient's satisfaction.     Jerrick Farve C.

## 2011-12-26 NOTE — Anesthesia Preprocedure Evaluation (Signed)
Anesthesia Evaluation  Patient identified by MRN, date of birth, ID band Patient awake    Reviewed: Allergy & Precautions, H&P , NPO status , Patient's Chart, lab work & pertinent test results  History of Anesthesia Complications Negative for: history of anesthetic complications  Airway Mallampati: II TM Distance: >3 FB     Dental  (+) Teeth Intact and Dental Advisory Given   Pulmonary Current Smoker (40 pak years),  clear to auscultation  Pulmonary exam normal       Cardiovascular hypertension, Pt. on medications neg cardio ROS - CAD Regular Normal Aorto-bifemoral by-pass   Neuro/Psych Negative Neurological ROS  Negative Psych ROS   GI/Hepatic negative GI ROS, Neg liver ROS,   Endo/Other  Negative Endocrine ROS  Renal/GU negative Renal ROS  Genitourinary negative   Musculoskeletal negative musculoskeletal ROS (+)   Abdominal   Peds  Hematology negative hematology ROS (+)   Anesthesia Other Findings   Reproductive/Obstetrics Hx of right breast cancer; s/p lumpectomy with radiation and chemo.                           Anesthesia Physical Anesthesia Plan  ASA: III  Anesthesia Plan: General   Post-op Pain Management:    Induction: Intravenous  Airway Management Planned: Oral ETT  Additional Equipment:   Intra-op Plan:   Post-operative Plan: Extubation in OR  Informed Consent: I have reviewed the patients History and Physical, chart, labs and discussed the procedure including the risks, benefits and alternatives for the proposed anesthesia with the patient or authorized representative who has indicated his/her understanding and acceptance.   Dental advisory given  Plan Discussed with: CRNA  Anesthesia Plan Comments:         Anesthesia Quick Evaluation

## 2011-12-26 NOTE — Brief Op Note (Signed)
12/26/2011  1:01 PM  PATIENT:  [name]  66 y.o. female    Patient Care Team: Carollee Herter, MD as PCP - General (Family Medicine) Jethro Bolus, MD (Internal Medicine) Charna Elizabeth, MD (Gastroenterology) Runell Gess, MD (Cardiology) Juleen China, MD as Consulting Physician (Vascular Surgery)   PRE-OPERATIVE DIAGNOSIS:  ventral wall hernia   POST-OPERATIVE DIAGNOSIS:  ventral wall hernia   PROCEDURE:  Procedure(s): LAPAROSCOPIC VENTRAL HERNIA INSERTION OF MESH Excision of peritoneal mass (~2cm)  SURGEON:  Surgeon(s): Ardeth Sportsman, MD  PHYSICIAN ASSISTANT:   ASSISTANTS: none   ANESTHESIA:   local and general  EBL:  Total I/O In: 1700 [I.V.:1700] Out: 105 [Urine:105]  BLOOD ADMINISTERED:none  DRAINS: none   LOCAL MEDICATIONS USED:  BUPIVICAINE 50CC  SPECIMEN:  Source of Specimen:  peritoneal abd wall mass  DISPOSITION OF SPECIMEN:  PATHOLOGY  COUNTS:  YES  TOURNIQUET:  * No tourniquets in log *  DICTATION: .Other Dictation: Dictation Number [id number]  PLAN OF CARE: Admit for overnight observation  PATIENT DISPOSITION:  PACU - hemodynamically stable.   Delay start of Pharmacological VTE agent (>24hrs) due to surgical blood loss or risk of bleeding:  {YES/NO/NOT APPLICABLE:20182

## 2011-12-26 NOTE — Anesthesia Postprocedure Evaluation (Signed)
Anesthesia Post Note  Patient: [name]  Procedure(s) Performed:  LAPAROSCOPIC VENTRAL HERNIA - With Mesh; INSERTION OF MESH  Anesthesia type: General  Patient location: PACU  Post pain: Pain level controlled  Post assessment: Post-op Vital signs reviewed  Last Vitals:  Filed Vitals:   12/26/11 1400  BP: 131/78  Pulse: 74  Temp:   Resp: 17    Post vital signs: Reviewed  Level of consciousness: sedated  Complications: No apparent anesthesia complications

## 2011-12-26 NOTE — Preoperative (Signed)
Beta Blockers   Reason not to administer Beta Blockers:Not Applicable 

## 2011-12-26 NOTE — Transfer of Care (Signed)
Immediate Anesthesia Transfer of Care Note  Patient: [name]  Procedure(s) Performed:  LAPAROSCOPIC VENTRAL HERNIA - With Mesh; INSERTION OF MESH  Patient Location: PACU  Anesthesia Type: General  Level of Consciousness: awake, oriented, sedated and patient cooperative  Airway & Oxygen Therapy: Patient Spontanous Breathing and Patient connected to face mask oxygen  Post-op Assessment: Report given to PACU RN, Post -op Vital signs reviewed and stable and Patient moving all extremities  Post vital signs: Reviewed and stable  Complications: No apparent anesthesia complications

## 2011-12-27 LAB — CBC
Hemoglobin: 10.3 g/dL — ABNORMAL LOW (ref 12.0–15.0)
MCHC: 32 g/dL (ref 30.0–36.0)
RDW: 15.8 % — ABNORMAL HIGH (ref 11.5–15.5)
WBC: 7.9 10*3/uL (ref 4.0–10.5)

## 2011-12-27 LAB — CREATININE, SERUM
Creatinine, Ser: 0.72 mg/dL (ref 0.50–1.10)
GFR calc non Af Amer: 88 mL/min — ABNORMAL LOW (ref >90)

## 2011-12-27 MED ORDER — MAGNESIUM HYDROXIDE 400 MG/5ML PO SUSP
30.0000 mL | Freq: Three times a day (TID) | ORAL | Status: DC | PRN
  Administered 2011-12-28: 30 mL via ORAL
  Filled 2011-12-27: qty 30

## 2011-12-27 MED ORDER — NAPROXEN 500 MG PO TABS
500.0000 mg | ORAL_TABLET | Freq: Two times a day (BID) | ORAL | Status: DC
  Administered 2011-12-27 – 2011-12-29 (×5): 500 mg via ORAL
  Filled 2011-12-27 (×7): qty 1

## 2011-12-27 MED ORDER — DOCUSATE SODIUM 100 MG PO CAPS
100.0000 mg | ORAL_CAPSULE | Freq: Every day | ORAL | Status: DC
  Filled 2011-12-27: qty 1

## 2011-12-27 MED ORDER — ACETAMINOPHEN 325 MG PO TABS: 325.0000 mg | ORAL_TABLET | Freq: Four times a day (QID) | ORAL | Status: DC | PRN

## 2011-12-27 NOTE — Progress Notes (Signed)
[name] [birth year] [id number]  PCP: Carollee Herter, MD, MD Outpatient Care Team: Patient Care Team: Carollee Herter, MD as PCP - General (Family Medicine) Jethro Bolus, MD (Internal Medicine) Charna Elizabeth, MD (Gastroenterology) Runell Gess, MD (Cardiology) Juleen China, MD as Consulting Physician (Vascular Surgery)  Inpatient Treatment Team: Treatment Team: Attending Provider: Ardeth Sportsman, MD; Registered Nurse: Skipper Cliche, RN; Registered Nurse: Romeo Rabon, RN; Technician: Anastasio Champion Debbink, NT  Subjective:  Walking well Sore Family in room Trying solids OK Needing IV pain meds  Objective:  Vital signs:  Temp:  [97.3 F (36.3 C)-98.5 F (36.9 C)] 98.3 F (36.8 C) (02/09 0545) Pulse Rate:  [65-82] 65  (02/09 0545) Resp:  [10-20] 18  (02/09 0545) BP: (126-149)/(57-80) 149/71 mmHg (02/09 0545) SpO2:  [94 %-100 %] 99 % (02/09 0545) Weight:  [156 lb 6.4 oz (70.943 kg)] 156 lb 6.4 oz (70.943 kg) (02/08 1444) Last BM Date: 12/25/11  Intake/Output   Yesterday:  02/08 0701 - 02/09 0700 In: 1800 [I.V.:1800] Out: 805 [Urine:805] This shift:  Total I/O In: -  Out: 1100 [Urine:1100]  Bowel function:  Flatus: y  BM: n  Physical Exam:  General: Pt awake/alert/oriented x4 in no acute distress Eyes: PERRL, normal EOM.  Sclera clear.  No icterus Neuro: CN II-XII intact w/o focal sensory/motor deficits. Lymph: No head/neck/groin lymphadenopathy Psych:  No delerium/psychosis/paranoia HENT: Normocephalic, Mucus membranes moist.  No thrush Neck: Supple, No tracheal deviation Chest: Clear No chest wall pain w good excursion CV:  Pulses intact.  Regular rhythm Abdomen: Soft, Tender LUQ incisions the most.  OnQ pump in place.  Nondistended.  No incarcerated hernias. Ext:  SCDs BLE.  No mjr edema.  No cyanosis Skin: No petechiae / purpurae  Results:   Labs: Results for orders placed during the hospital encounter of 12/26/11 (from  the past 48 hour(s))  POTASSIUM     Status: Normal   Collection Time   12/26/11  8:00 AM      Component Value Range Comment   Potassium 4.0  3.5 - 5.1 (mEq/L)   CREATININE, SERUM     Status: Abnormal   Collection Time   12/26/11  8:00 AM      Component Value Range Comment   Creatinine, Ser 0.79  0.50 - 1.10 (mg/dL)    GFR calc non Af Amer 86 (*) >90 (mL/min)    GFR calc Af Amer >90  >90 (mL/min)   CBC     Status: Abnormal   Collection Time   12/27/11  3:50 AM      Component Value Range Comment   WBC 7.9  4.0 - 10.5 (K/uL)    RBC 3.73 (*) 3.87 - 5.11 (MIL/uL)    Hemoglobin 10.3 (*) 12.0 - 15.0 (g/dL)    HCT 14.7 (*) 82.9 - 46.0 (%)    MCV 86.3  78.0 - 100.0 (fL)    MCH 27.6  26.0 - 34.0 (pg)    MCHC 32.0  30.0 - 36.0 (g/dL)    RDW 56.2 (*) 13.0 - 15.5 (%)    Platelets 175  150 - 400 (K/uL)   CREATININE, SERUM     Status: Abnormal   Collection Time   12/27/11  3:50 AM      Component Value Range Comment   Creatinine, Ser 0.72  0.50 - 1.10 (mg/dL)    GFR calc non Af Amer 88 (*) >90 (mL/min)    GFR calc Af Amer >90  >90 (  mL/min)   POTASSIUM     Status: Normal   Collection Time   12/27/11  3:50 AM      Component Value Range Comment   Potassium 4.0  3.5 - 5.1 (mEq/L)     Imaging / Studies: No results found.  Medications / Allergies: per chart  Antibiotics: Anti-infectives     Start     Dose/Rate Route Frequency Ordered Stop   12/26/11 0800   ceFAZolin (ANCEF) IVPB 1 g/50 mL premix        1 g 100 mL/hr over 30 Minutes Intravenous 60 min pre-op 12/26/11 0746 12/26/11 1014          Assessment  [name]  66 y.o. female  1 Day Post-Op  Procedure(s): LAPAROSCOPIC VENTRAL HERNIA INSERTION OF MESH  Problem List:  Principal Problem:  *Incisional hernia, periumbilical Active Problems:  PVD (peripheral vascular disease)  Tobacco abuse   Sore but stabilizing  Plan: Inc PO pain control Wean off IVF Binder Bowel regimen -VTE prophylaxis- SCDs, etc -mobilize as  tolerated to help recovery  Ardeth Sportsman, M.D., F.A.C.S. Gastrointestinal and Minimally Invasive Surgery [facility] [address] [place], [place] [address] [phone] Main / Paging [phone] Voice Mail   12/27/2011

## 2011-12-27 NOTE — Op Note (Signed)
NAMEMarland Cox  AVALINA, BENKO NO.:  [id number]  MEDICAL RECORD NO.:  [id number]  LOCATION:  1535                         FACILITY:  [facility]  PHYSICIAN:  Ardeth Sportsman, MD     DATE OF BIRTH:  [birth year]  DATE OF PROCEDURE: DATE OF DISCHARGE:                              OPERATIVE REPORT   PRIMARY CARE PHYSICIAN:  Sharlot Gowda, M.D.  VASCULAR SURGEON:  Lawson Radar, M.D.  SURGEON:  Ardeth Sportsman, MD  ASSIST:  RN.  PREOPERATIVE DIAGNOSIS:  Periumbilical and possible supraumbilical ventral or incisional hernias.  POSTOPERATIVE DIAGNOSIS:  Periumbilical and supraumbilical ventral or incisional hernias.  PROCEDURE PERFORMED: 1. Laparoscopic lysis of adhesions x30 minutes (equals third of the     case). 2. Laparoscopic ventral hernia repair with mesh. 3. Excision of peritoneal wall mass, question fibroma.  ANESTHESIA: 1. General anesthesia. 2. Local anesthetic and a field block around all fascial stitch sites. 3. Placement of continuous bupivacaine pump (ON-Q) into preperitoneal     plane.  SPECIMEN:  Periumbilical, peritoneal abdominal wall mass.  DRAINS:  None.  ESTIMATED BLOOD LOSS:  Minimal.  COMPLICATIONS:  None apparent.  INDICATIONS:  [name] is a 66 year old female who required major aortic surgery.  She developed an incisional hernia  with some pain at least periumbilically.  Because of its moderate size and small bowel seen within it at times, recommendation made for repair.  I suspected a possible supraumbilical hernia as well.  I recommended diagnostic laparoscopy with laparoscopical adhesions to map out the region with a hernia repair with mesh.  Technique, risks, benefits, alternatives discussed.  Period of pain and recovery was discussed as well.  Risks, benefits, alternatives discussed.  Questions answered.  She agreed to proceed.  OPERATIVE FINDINGS:  She had a Swiss-cheese region from her umbilicus to supraumbilically of 14 x 7  cm of hernias.  There were 2 larger ones with a central transverse bridge.  One was incarcerated with omentum, the other one had small bowel nearby, but not incarcerated into it.  DESCRIPTION OF PROCEDURE:  Informed consent was confirmed.  Patient underwent general anesthesia without any difficulty.  She was positioned supine with arms tucked.  She had a Foley catheter sterilely placed. Her abdomen was prepped and draped in a sterile fashion.  Surgical time- out confirmed our plan.  I initially attempted to place a 5-mm port in the left upper quadrant along the left subcostal ridge using optical entry technique.  However, I was going in some distance without any definite entry.  I tried in the right upper quadrant with the same result.  I therefore did a cut down through her umbilical hernias periumbilically and placed a port in.  I induced carbon dioxide insufflation.  I did camera inspection and I could see the prior port attempts that had not gotten through the entire abdominal wall.  I saw no evidence of any injury or hematoma or breach.  I redirected the ports into the peritoneal cavity.  I then placed 5-mm ports in the left lower quadrant and left flank.  I upsized the periumbilical port to a 10-mm port and placed that in the right lower quadrant.  I focused attention.  She had moderate volume of adhesions of greater omentum and small bowel to the anterior abdominal wall.  I freed these off using cold scissors around the small bowel and then some focus cautery scissors more on the omentum cephalad.  I ended up taking the falciform ligament down from the umbilical site, but  allowed that to fall down as it was already adherent with the greater omentum as 1 large piece.  I freed off the anterior hepatic attachments to the anterior abdominal wall as well following that up towards to the more superior diaphragm.  While freeing off, I did find a hard 2 cm fibrotic nodular  mass. Initially, I thought it might be granuloma around one of the fascial stitches, but I went ahead and trimmed it off and bivalved it and could not find any tissue within it.  I do not know if it was old aneurysmal wall or what, then I decided to send it to be safe.  I did inspection and hemostasis was excellent and there was no injury to bowel.  There were a couple of intraloop adhesions of small bowel.  I freed it off and reinspected with no evidence of injury.  Given the larger region, I decided to use a 20 x 30 cm dual sided mesh (Parietex/hydrophilic Seprafilm-like dual-sided mesh).  I placed it in the abdomen and secured it to the anterior abdominal wall using #1 Prolene interrupted stitches around the periphery x16.  I then used an absorbable tacker x2 to help tack the central and peripheral regions around it.  This provided at least 5-10 cm of  circumferential coverage around the whole section of Swiss-cheese hernias.  I essentially covered most of her anterior abdominal wall.  I then placed ON-Q catheters in the preperitoneal plane starting the subxiphoid region under direct laparoscopic guidance down towards the bilateral hips.  There were 2 catheters.  I reinspected and again noted.  Hemostasis was excellent.  I evacuated carbon dioxide and removed the ports.  I closed the port sites using Monocryl stitch.  I closed the puncture sites of the fascial stitch using Steri-Strips and sterile dressings.  Patient was extubated to recovery room.  I discussed postop care with the patient in detail with the husband and family.  Repeated numerous times.  Instructions were written.  I will watch her at least overnight, perhaps longer depending on how she recovers.     Ardeth Sportsman, MD     SCG/MEDQ  D:  12/26/2011  T:  12/27/2011  Job:  [id number]  cc:   Sharlot Gowda, M.D. Fax: [phone]

## 2011-12-28 DIAGNOSIS — I10 Essential (primary) hypertension: Secondary | ICD-10-CM | POA: Insufficient documentation

## 2011-12-28 MED ORDER — ACETAMINOPHEN 325 MG PO TABS: 325.0000 mg | ORAL_TABLET | Freq: Four times a day (QID) | ORAL | Status: DC | PRN

## 2011-12-28 MED ORDER — KETOTIFEN FUMARATE 0.025 % OP SOLN: 1.0000 [drp] | Freq: Two times a day (BID) | OPHTHALMIC | Status: DC | PRN

## 2011-12-28 MED ORDER — OXYCODONE HCL 5 MG PO TABS
5.0000 mg | ORAL_TABLET | Freq: Four times a day (QID) | ORAL | Status: DC | PRN
  Administered 2011-12-28 – 2011-12-29 (×2): 10 mg via ORAL
  Filled 2011-12-28 (×2): qty 2

## 2011-12-28 MED ORDER — DOCUSATE SODIUM 100 MG PO CAPS
100.0000 mg | ORAL_CAPSULE | Freq: Two times a day (BID) | ORAL | Status: DC
  Administered 2011-12-28 – 2011-12-29 (×3): 100 mg via ORAL
  Filled 2011-12-28 (×6): qty 1

## 2011-12-28 MED ORDER — BISACODYL 10 MG RE SUPP
10.0000 mg | Freq: Every day | RECTAL | Status: DC
  Administered 2011-12-28: 10 mg via RECTAL
  Filled 2011-12-28 (×2): qty 1

## 2011-12-28 NOTE — Progress Notes (Signed)
[name] [birth year] [id number]  PCP: Carollee Herter, MD, MD Outpatient Care Team: Patient Care Team: Carollee Herter, MD as PCP - General (Family Medicine) Jethro Bolus, MD (Internal Medicine) Charna Elizabeth, MD (Gastroenterology) Runell Gess, MD (Cardiology) Juleen China, MD as Consulting Physician (Vascular Surgery)  Inpatient Treatment Team: Treatment Team: Attending Provider: Ardeth Sportsman, MD; Registered Nurse: Skipper Cliche, RN; Registered Nurse: Romeo Rabon, RN; Technician: Anastasio Champion Debbink, NT; Technician: Justice Britain, NT; Technician: Sharlette Dense, NT; Registered Nurse: Patsey Berthold, RN; Registered Nurse: Rometta Emery, RN  Subjective:  Walking better Sore, starting to use PO pain meds Needing IV pain meds PO solids OK  Objective:  Vital signs:  Temp:  [98 F (36.7 C)-98.3 F (36.8 C)] 98.3 F (36.8 C) (02/10 0552) Pulse Rate:  [66-72] 72  (02/10 0552) Resp:  [18] 18  (02/10 0552) BP: (101-140)/(62-79) 140/79 mmHg (02/10 0552) SpO2:  [97 %-99 %] 97 % (02/10 0552) Last BM Date: 12/25/11  Intake/Output   Yesterday:  02/09 0701 - 02/10 0700 In: 980 [P.O.:860; I.V.:120] Out: 2950 [Urine:2950] This shift:  Total I/O In: 360 [P.O.:360] Out: -   Bowel function:  Flatus: NO  BM: NO  Physical Exam:  General: Pt awake/alert/oriented x4 in no acute distress Eyes: PERRL, normal EOM.  Sclera clear.  No icterus Neuro: CN II-XII intact w/o focal sensory/motor deficits. Lymph: No head/neck/groin lymphadenopathy Psych:  No delerium/psychosis/paranoia HENT: Normocephalic, Mucus membranes moist.  No thrush Neck: Supple, No tracheal deviation Chest: Clear No chest wall pain w good excursion CV:  Pulses intact.  Regular rhythm Abdomen: MIldly tender.  OnQ pump in place.  Obese, mildly distended.  No incarcerated hernias. Ext:  SCDs BLE.  No mjr edema.  No cyanosis Skin: No petechiae / purpurae  Results:    Labs: Results for orders placed during the hospital encounter of 12/26/11 (from the past 48 hour(s))  CBC     Status: Abnormal   Collection Time   12/27/11  3:50 AM      Component Value Range Comment   WBC 7.9  4.0 - 10.5 (K/uL)    RBC 3.73 (*) 3.87 - 5.11 (MIL/uL)    Hemoglobin 10.3 (*) 12.0 - 15.0 (g/dL)    HCT 65.7 (*) 84.6 - 46.0 (%)    MCV 86.3  78.0 - 100.0 (fL)    MCH 27.6  26.0 - 34.0 (pg)    MCHC 32.0  30.0 - 36.0 (g/dL)    RDW 96.2 (*) 95.2 - 15.5 (%)    Platelets 175  150 - 400 (K/uL)   CREATININE, SERUM     Status: Abnormal   Collection Time   12/27/11  3:50 AM      Component Value Range Comment   Creatinine, Ser 0.72  0.50 - 1.10 (mg/dL)    GFR calc non Af Amer 88 (*) >90 (mL/min)    GFR calc Af Amer >90  >90 (mL/min)   POTASSIUM     Status: Normal   Collection Time   12/27/11  3:50 AM      Component Value Range Comment   Potassium 4.0  3.5 - 5.1 (mEq/L)     Imaging / Studies: No results found.  Medications / Allergies: per chart  Antibiotics: Anti-infectives     Start     Dose/Rate Route Frequency Ordered Stop   12/26/11 0800   ceFAZolin (ANCEF) IVPB 1 g/50 mL premix        1  g 100 mL/hr over 30 Minutes Intravenous 60 min pre-op 12/26/11 0746 12/26/11 1014          Assessment  [name]  66 y.o. female  2 Days Post-Op  Procedure(s): LAPAROSCOPIC VENTRAL HERNIA INSERTION OF MESH  Problem List:  Principal Problem:  *Incisional hernia, periumbilical Active Problems:  PVD (peripheral vascular disease)  Allergic rhinitis due to pollen  Tobacco abuse  Hypertension  Sore but stabilizing Ileus not fully resolved Not ready for D/C yet  Plan: Inc PO pain control Wean off IV pain meds as possible Increase bowel regimen -VTE prophylaxis- SCDs, etc -mobilize as tolerated to help recovery -BP OK  Ardeth Sportsman, M.D., F.A.C.S. Gastrointestinal and Minimally Invasive Surgery [facility] [address], [address] [place], [place] [address] [phone] Main / Paging [phone] Voice Mail   12/28/2011

## 2011-12-29 MED ORDER — OXYCODONE-ACETAMINOPHEN 5-325 MG PO TABS: 1.0000 | ORAL_TABLET | ORAL | Status: AC | PRN

## 2011-12-29 NOTE — Discharge Summary (Signed)
Physician Discharge Summary  Patient ID: [name] MRN: [id number] DOB/AGE: [birth year] 66 y.o.  Admit date: 12/26/2011 Discharge date: 12/29/2011  Admission Diagnoses:  Discharge Diagnoses:  Principal Problem:  *Incisional hernia, periumbilical Active Problems:  PVD (peripheral vascular disease)  Allergic rhinitis due to pollen  Tobacco abuse  Hypertension   Discharged Condition: good  Hospital Course: pt underwent ventral hernia repair with mesh. She tolerated it well. Hospital course was uneventful. She is now ready for discharge home  Consults: None  Significant Diagnostic Studies: none  Treatments: surgery: ventral hernia repair with mesh  Discharge Exam: Blood pressure 101/60, pulse 78, temperature 98.5 F (36.9 C), temperature source Oral, resp. rate 18, height 5\' 3"  (1.6 m), weight 156 lb 6.4 oz (70.943 kg), SpO2 97.00%. GI: soft, non-tender; bowel sounds normal; no masses,  no organomegaly  Disposition: Home or Self Care   Medication List  As of 12/29/2011  4:32 PM   ASK your doctor about these medications         ALIGN 4 MG Caps   Take 1 capsule by mouth 3 (three) times a week.      aspirin EC 81 MG tablet   Take 81 mg by mouth daily before breakfast.      desonide 0.05 % cream   Commonly known as: DESOWEN   Apply 1 application topically as needed. For rash.      fexofenadine 180 MG tablet   Commonly known as: ALLEGRA   Take 180 mg by mouth daily as needed. For allergies.      hydrochlorothiazide 25 MG tablet   Commonly known as: HYDRODIURIL   Take 25 mg by mouth daily.      ketotifen 0.025 % ophthalmic solution   Commonly known as: ZADITOR   Place 1 drop into both eyes daily as needed. For dry/itchy allergy eyes.      nicotine 14 mg/24hr patch   Commonly known as: NICODERM CQ - dosed in mg/24 hours   Place 1 patch onto the skin daily.      potassium chloride 10 MEQ tablet   Commonly known as: K-DUR   Take 2 tablets (20 mEq total) by  mouth 2 (two) times daily.      STOOL SOFTENER 100 MG capsule   Generic drug: docusate sodium   Take 100 mg by mouth 4 (four) times a week.      triamcinolone cream 0.1 %   Commonly known as: KENALOG   Apply 1 application topically as needed. For rash.           Follow-up Information    Follow up with GROSS,STEVEN C., MD in 3 weeks.   Contact information:   [facility] [address] [place] [address] [phone]          Signed: Robyne Askew 12/29/2011, 4:32 PM

## 2011-12-29 NOTE — Progress Notes (Signed)
Patient provided with discharge instructions and prescriptions. Patient verbalized understanding. Patient discharged to home. 

## 2012-01-19 ENCOUNTER — Encounter (HOSPITAL_COMMUNITY): Payer: Self-pay | Admitting: Surgery

## 2012-01-27 ENCOUNTER — Encounter (INDEPENDENT_AMBULATORY_CARE_PROVIDER_SITE_OTHER): Payer: Self-pay | Admitting: Surgery

## 2012-01-27 ENCOUNTER — Ambulatory Visit (INDEPENDENT_AMBULATORY_CARE_PROVIDER_SITE_OTHER): Payer: Medicare Other | Admitting: Surgery

## 2012-01-27 VITALS — BP 122/80 | HR 80 | Temp 99.2°F | Resp 18 | Ht 63.0 in | Wt 151.0 lb

## 2012-01-27 DIAGNOSIS — K432 Incisional hernia without obstruction or gangrene: Secondary | ICD-10-CM

## 2012-01-27 NOTE — Progress Notes (Signed)
Subjective:     Patient ID: [name], female   DOB: 10-19-1946, 66 y.o.   MRN: [id number]  HPI   [name]  [birth year] [id number]  Patient Care Team: Ronnald Nian, MD as PCP - General (Family Medicine) Exie Parody, MD (Internal Medicine) Charna Elizabeth, MD (Gastroenterology) Runell Gess, MD (Cardiology) Nada Libman, MD as Consulting Physician (Vascular Surgery)  This patient is a 66 y.o.female who presents today for surgical evaluation at the request of Dr. Myra Gianotti.  Procedure: Laparoscopic lysis of adhesions and repair of ventral incisional hernias 12/26/2011  Patient comes with her husband. She's doing pretty well. Off all narcotics within the first week. Using abdominal. Walking almost a mile every other day. Still constipated. Using a laxative once a week. Occasionally using prunes. Does not like any fiber supplements. Does not like feeling constipated. Wondering if it's okay to start traveling again   Patient Active Problem List  Diagnoses  . PVD (peripheral vascular disease)  . History of breast cancer  . Allergic rhinitis due to pollen  . Abdominal pain  . Incisional hernia, periumbilical  . Tobacco abuse  . Hypertension    Past Medical History  Diagnosis Date  . Hypertension   . Peripheral vascular disease   . Cancer     Right Breast  . Hyperlipidemia   . Seasonal allergies   . Abdominal wall hernia   . Blood transfusion     30+ yrs. ago    Past Surgical History  Procedure Date  . Abdominal hysterectomy   . Vascular surgery 05/15/11    aobifemoral bypass graft  . Breast surgery     right lumpectomy  . Breast surgery     multiple breast surgeries  . Aortobifemoral bpg 05/15/11    Using a 14 x8 bifurcated Dacron Graft  . Dilation and curettage of uterus     3-4 in late 20"s  . Ventral hernia repair 12/26/2011    Procedure: LAPAROSCOPIC VENTRAL HERNIA;  Surgeon: Ardeth Sportsman, MD;  Location: [facility];  Service: General;  Laterality: N/A;   With Mesh    History   Social History  . Marital Status: Married    Spouse Name: N/A    Number of Children: N/A  . Years of Education: N/A   Occupational History  . Not on file.   Social History Main Topics  . Smoking status: Current Some Day Smoker -- 0.2 packs/day for 30 years    Types: Cigarettes  . Smokeless tobacco: Never Used  . Alcohol Use: No     occasional alcohol intake  . Drug Use: No  . Sexually Active: Not on file   Other Topics Concern  . Not on file   Social History Narrative  . No narrative on file    Family History  Problem Relation Age of Onset  . Heart attack Sister   . Heart attack Brother   . Stroke Sister   . Cancer Mother     BREAST, BRAIN  . Heart disease Mother     Current outpatient prescriptions:aspirin EC 81 MG tablet, Take 81 mg by mouth daily before breakfast., Disp: , Rfl: ;  desonide (DESOWEN) 0.05 % cream, Apply 1 application topically as needed. For rash., Disp: , Rfl: ;  docusate sodium (STOOL SOFTENER) 100 MG capsule, Take 100 mg by mouth 4 (four) times a week., Disp: , Rfl: ;  fexofenadine (ALLEGRA) 180 MG tablet, Take 180 mg by mouth daily as needed.  For allergies., Disp: , Rfl:  hydrochlorothiazide (HYDRODIURIL) 25 MG tablet, Take 25 mg by mouth daily. , Disp: , Rfl: ;  ketotifen (ZADITOR) 0.025 % ophthalmic solution, Place 1 drop into both eyes daily as needed. For dry/itchy allergy eyes., Disp: , Rfl: ;  nicotine (NICODERM CQ - DOSED IN MG/24 HOURS) 14 mg/24hr patch, Place 1 patch onto the skin daily., Disp: , Rfl:  potassium chloride SA (K-DUR,KLOR-CON) 20 MEQ tablet, Take 40 mEq by mouth 2 (two) times daily., Disp: , Rfl: ;  Probiotic Product (ALIGN) 4 MG CAPS, Take 1 capsule by mouth 3 (three) times a week., Disp: , Rfl: ;  triamcinolone cream (KENALOG) 0.1 %, Apply 1 application topically as needed. For rash., Disp: , Rfl:   Allergies  Allergen Reactions  . Latex Rash    BP 122/80  Pulse 80  Temp(Src) 99.2 F (37.3 C)  (Temporal)  Resp 18  Ht 5\' 3"  (1.6 m)  Wt 151 lb (68.493 kg)  BMI 26.75 kg/m2     Review of Systems  Constitutional: Negative for fever, chills, diaphoresis, appetite change and fatigue.  HENT: Negative for ear pain, sore throat, trouble swallowing, neck pain and ear discharge.   Eyes: Negative for photophobia, discharge and visual disturbance.  Respiratory: Negative for cough, choking, chest tightness and shortness of breath.   Cardiovascular: Negative for chest pain, palpitations and leg swelling.       Mild claudication after walking 0.5 mile  Gastrointestinal: Positive for abdominal pain. Negative for nausea, vomiting, diarrhea, constipation, blood in stool, abdominal distention, anal bleeding and rectal pain.  Genitourinary: Negative for dysuria, frequency and difficulty urinating.  Musculoskeletal: Negative for myalgias and gait problem.  Skin: Negative for color change, pallor and rash.  Neurological: Negative for dizziness, speech difficulty, weakness and numbness.  Hematological: Negative for adenopathy.  Psychiatric/Behavioral: Negative for confusion and agitation. The patient is not nervous/anxious.        Objective:   Physical Exam  Constitutional: She is oriented to person, place, and time. She appears well-developed and well-nourished. No distress.  HENT:  Head: Normocephalic.  Mouth/Throat: Oropharynx is clear and moist. No oropharyngeal exudate.  Eyes: Conjunctivae and EOM are normal. Pupils are equal, round, and reactive to light. No scleral icterus.  Neck: Normal range of motion. Neck supple. No tracheal deviation present.  Cardiovascular: Normal rate, regular rhythm and intact distal pulses.   Pulmonary/Chest: Effort normal and breath sounds normal. No respiratory distress. She exhibits no tenderness.  Abdominal: Soft. She exhibits no distension. There is tenderness. There is no rebound and no guarding. Hernia confirmed negative in the right inguinal area and  confirmed negative in the left inguinal area.       Lap incisions well healed  Genitourinary: No vaginal discharge found.  Musculoskeletal: Normal range of motion. She exhibits no tenderness.  Lymphadenopathy:    She has no cervical adenopathy.       Right: No inguinal adenopathy present.       Left: No inguinal adenopathy present.  Neurological: She is alert and oriented to person, place, and time. No cranial nerve deficit. She exhibits normal muscle tone. Coordination normal.  Skin: Skin is warm and dry. No rash noted. She is not diaphoretic. No erythema.  Psychiatric: She has a normal mood and affect. Her behavior is normal. Judgment and thought content normal.       Assessment:     VWH, incisional x2 s/p repair.  Recovering pretty well    Plan:  Increase activity as tolerated.  Do not push through pain.  Advanced on diet as tolerated. Bowel regimen to avoid problems.  She kept fussing about how she not like certain fiber regimens. I noted there were many different types but I strongly recommended she stay on something every day. Has history of constipation in the past.  Return to clinic p.r.n. The patient expressed understanding and appreciation

## 2012-01-27 NOTE — Patient Instructions (Signed)

## 2012-04-04 IMAGING — CT CT ABD-PELV W/ CM
2 of 6 series · 16 of 46 positions shown, 18 images · IV contrast (APPLIED)
Comparison: CTA from 03/17/2011

CLINICAL DATA: Abdominal pain and ileus.  Status post aortofemoral
bypass.

CT ABDOMEN AND PELVIS WITH CONTRAST
TECHNIQUE: Multidetector CT imaging of the abdomen and pelvis was
performed following the standard protocol during bolus
administration of intravenous contrast.
Contrast: 100 ml Jmnipaque-LWW

[Series 2: abd/pelv with 5.0 b31f st · axial · 0.66mm/px · z∈[-403,-8]mm · 13 of 89 slices shown, 15 images]
[im 5/89  soft-tissue]
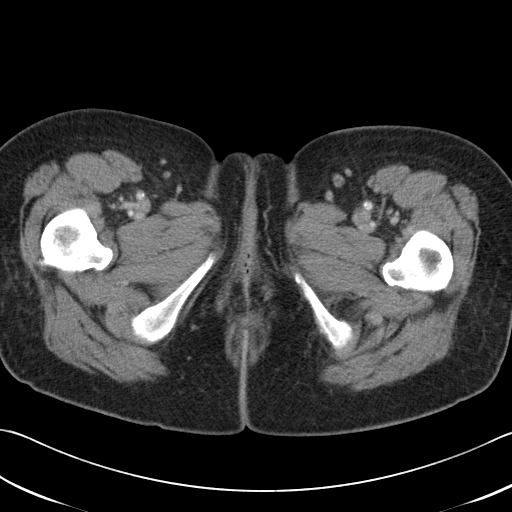
[im 5/89  bone]
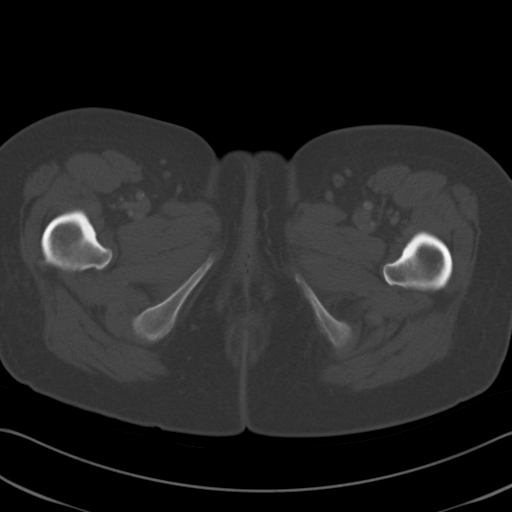
[im 14/89  soft-tissue]
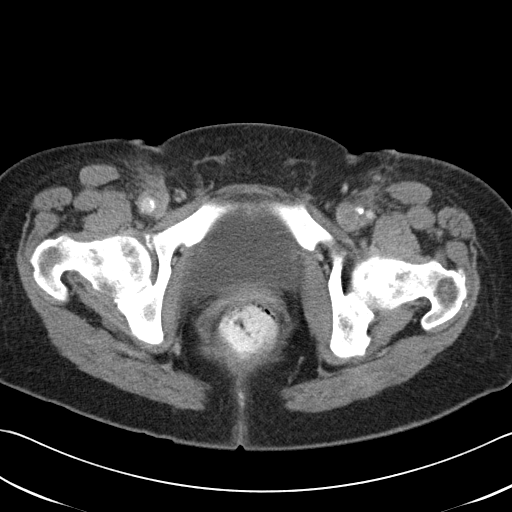
[im 19/89  soft-tissue]
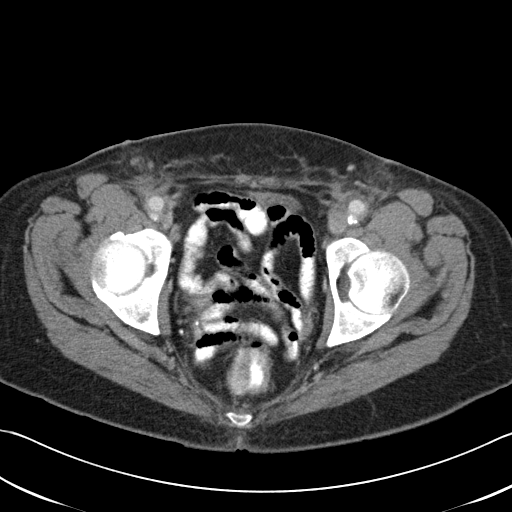
[im 24/89  soft-tissue]
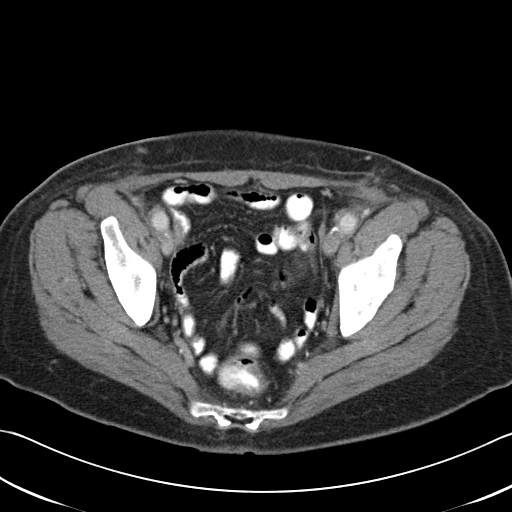
[im 33/89  soft-tissue]
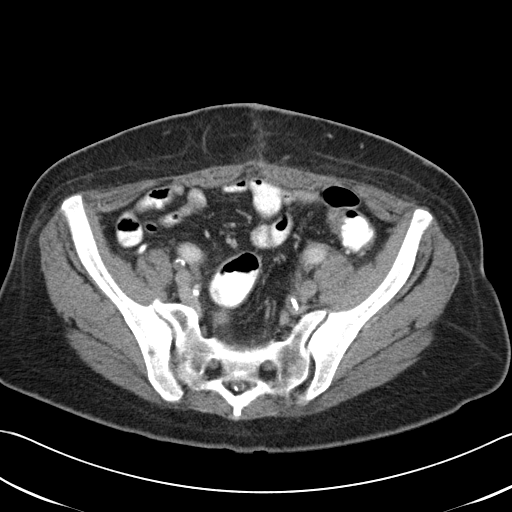
[im 38/89  soft-tissue]
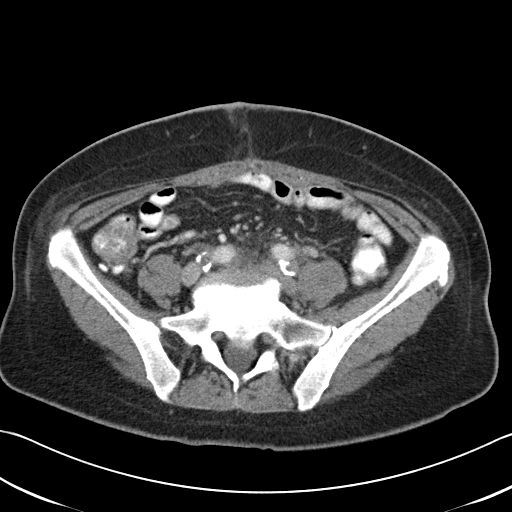
[im 47/89  soft-tissue]
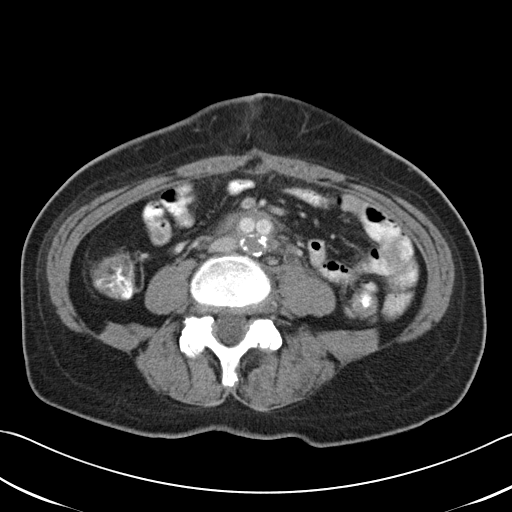
[im 51/89  soft-tissue]
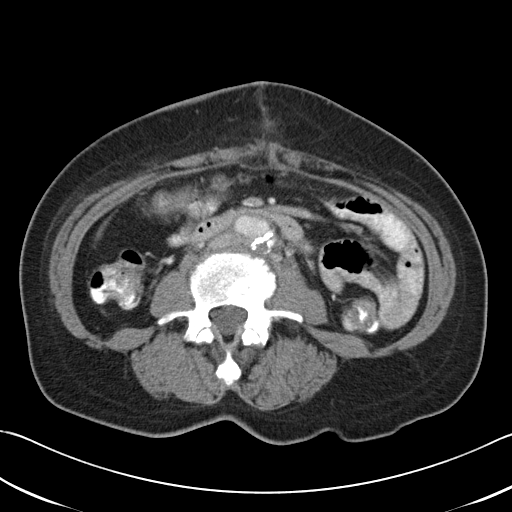
[im 56/89  soft-tissue]
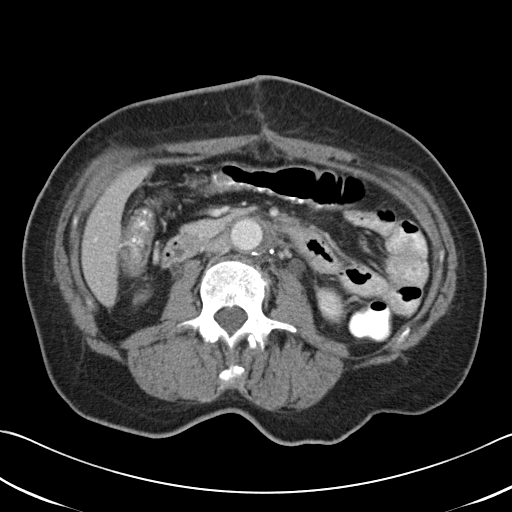
[im 56/89  bone]
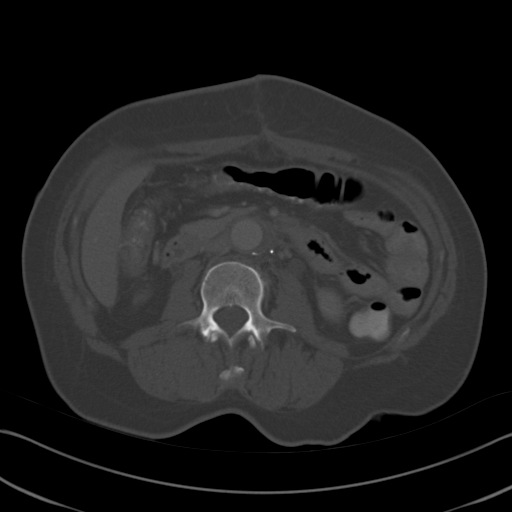
[im 65/89  soft-tissue]
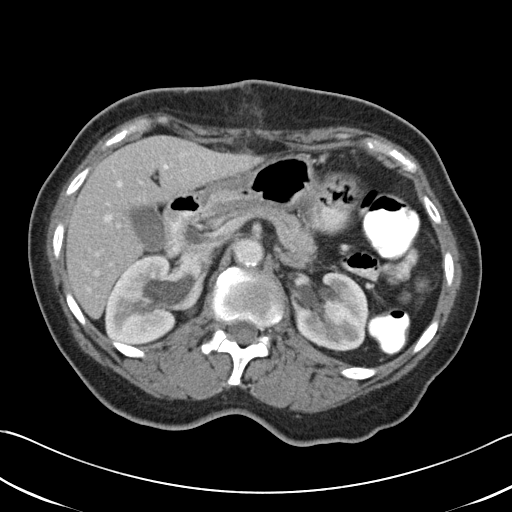
[im 70/89  soft-tissue]
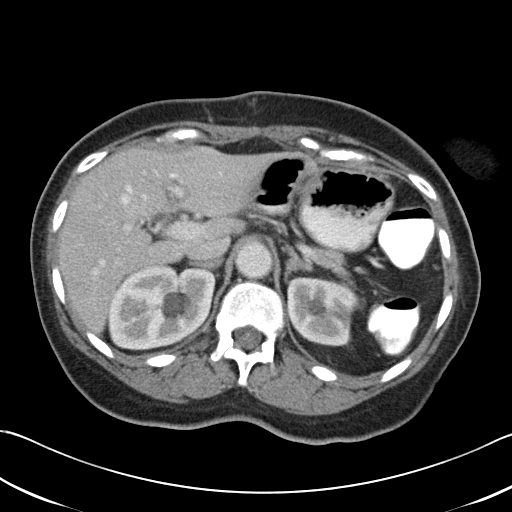
[im 75/89  soft-tissue]
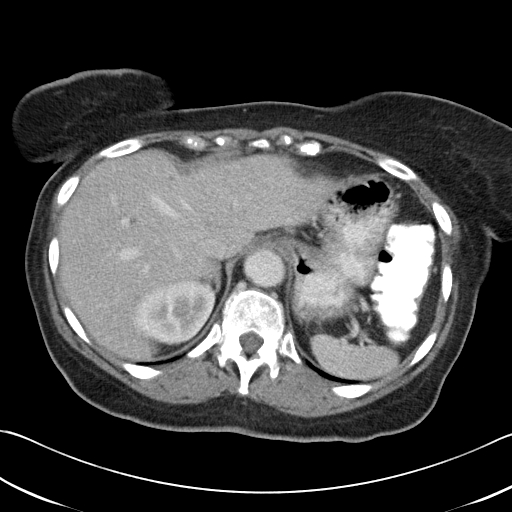
[im 84/89  soft-tissue]
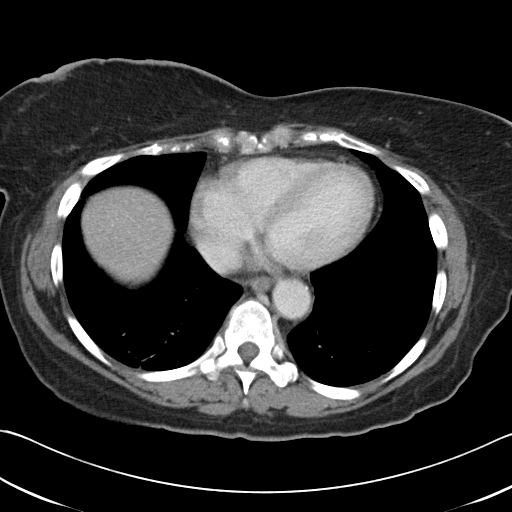

[Series 5: abd/pelv with 2.0 spo st · coronal · 0.90mm/px · 3 of 121 slices shown]
[im 41/121  soft-tissue]
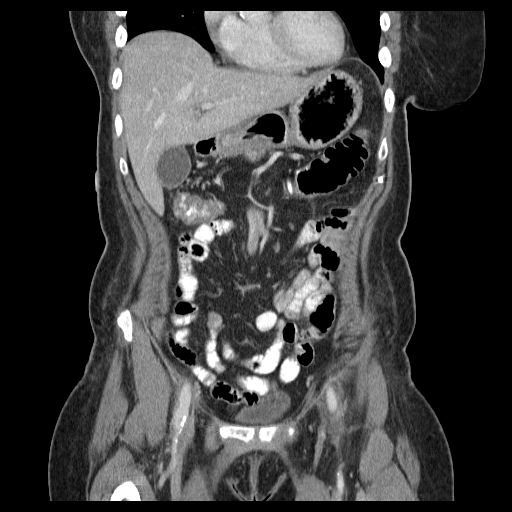
[im 54/121  soft-tissue]
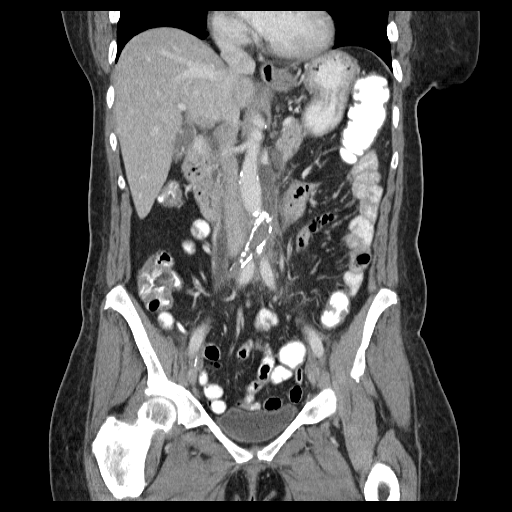
[im 67/121  soft-tissue]
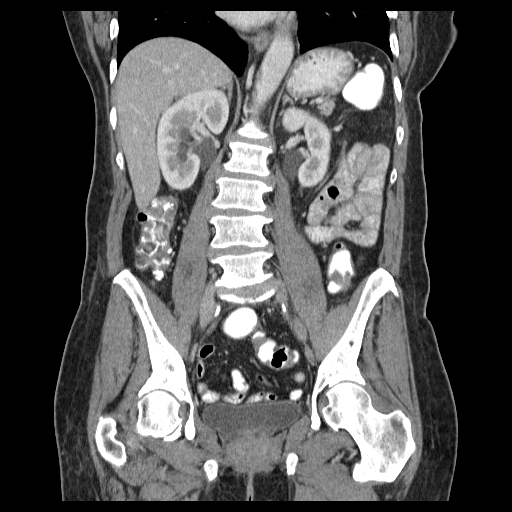

[16 of 46 positions shown; findings below may reference images not displayed]

FINDINGS: No evidence for free air.  High density structures
layering in the gallbladder consistent with gallstones.  No
evidence for gallbladder distention or surrounding inflammatory
changes.  Sub centimeter low density structure in the right hepatic
lobe is most compatible with a cyst.  Normal appearance of the
liver and portal venous system.  Normal appearance of the spleen,
pancreas and adrenal glands.  There is new fullness involving the
renal collecting system bilaterally.  Findings consistent with mild-
moderate hydronephrosis.  Ureters are dilated down to the level of
the iliac arteries bilaterally.  The ureters distal to the iliac
arteries are decompressed.  Patient has undergone an aorto-
bifemoral bypass.  The bypass limbs are patent.  There is edema and
stranding around the aorta and graft limbs.  Small amount of fluid
along the left side of the upper graft.  There is not a distinct or
well-defined abscess collection.

No gross abnormality to the urinary bladder, prostate or seminal
vesicles.  No significant free fluid in the pelvis.  Mild wall
thickening of the right colon may be related to under distention.
There is contrast throughout small and large bowel and no evidence
to suggest obstruction.  No acute osseous abnormality.
Postsurgical changes in both groins.  Superficial femoral arteries
are patent bilaterally.  A small fluid collection in the right
groin incision measures 2.1 x 2.0 cm.  Appendix has a normal
appearance.
IMPRESSION: Bilateral mild-moderate hydronephrosis.  There is a transition
point in the mid ureters at the level of the iliac vessels
bilaterally.  Findings are probably related to inflammation from
the bypass procedure.

Postoperative changes surrounding the abdominal aortic bypass
graft.  A small amount of fluid and stranding along the proximal
graft.  No clear evidence for an abscess collection.  Small fluid
collection in the right groin.

No evidence of bowel obstruction or ileus.  Mild wall thickening in
the right colon is probably related to under distention.

Cholelithiasis.

## 2012-04-07 ENCOUNTER — Telehealth: Payer: Self-pay | Admitting: Oncology

## 2012-04-07 NOTE — Telephone Encounter (Signed)
lmonvm for pt re appt for 6/17. Schedule mailed today.

## 2012-05-03 ENCOUNTER — Telehealth: Payer: Self-pay | Admitting: Oncology

## 2012-05-03 ENCOUNTER — Ambulatory Visit (HOSPITAL_BASED_OUTPATIENT_CLINIC_OR_DEPARTMENT_OTHER): Payer: Medicare Other | Admitting: Oncology

## 2012-05-03 ENCOUNTER — Other Ambulatory Visit (HOSPITAL_BASED_OUTPATIENT_CLINIC_OR_DEPARTMENT_OTHER): Payer: Medicare Other | Admitting: Lab

## 2012-05-03 VITALS — BP 128/85 | HR 89 | Temp 97.5°F | Ht 63.0 in | Wt 155.3 lb

## 2012-05-03 DIAGNOSIS — C50419 Malignant neoplasm of upper-outer quadrant of unspecified female breast: Secondary | ICD-10-CM

## 2012-05-03 DIAGNOSIS — Z853 Personal history of malignant neoplasm of breast: Secondary | ICD-10-CM

## 2012-05-03 LAB — CBC WITH DIFFERENTIAL/PLATELET
BASO%: 0.7 % (ref 0.0–2.0)
EOS%: 2.9 % (ref 0.0–7.0)
LYMPH%: 43.5 % (ref 14.0–49.7)
MCH: 28.2 pg (ref 25.1–34.0)
MCHC: 31.9 g/dL (ref 31.5–36.0)
MCV: 88.3 fL (ref 79.5–101.0)
MONO%: 7.6 % (ref 0.0–14.0)
Platelets: 181 10*3/uL (ref 145–400)
RBC: 4.23 10*6/uL (ref 3.70–5.45)
WBC: 4.9 10*3/uL (ref 3.9–10.3)

## 2012-05-03 LAB — COMPREHENSIVE METABOLIC PANEL
ALT: 13 U/L (ref 0–35)
Alkaline Phosphatase: 96 U/L (ref 39–117)
Sodium: 143 mEq/L (ref 135–145)
Total Bilirubin: 0.4 mg/dL (ref 0.3–1.2)
Total Protein: 6.7 g/dL (ref 6.0–8.3)

## 2012-05-03 NOTE — Progress Notes (Signed)
[facility]  [phone] [phone]   OFFICE PROGRESS NOTE   Cc:  Julie Herter, MD  DIAGNOSIS: History of 0.7 cm, grade 2, invasive ductal carcinoma, with high-grade DCIS; 2/15 lymph nodes positive; ER positive, PR negative, HER-2/neu negative.  PAST THERAPY: Status post lumpectomy with axillary dissection on other source 2002. She received adjuvant chemotherapy FEC and agitation therapy for adjuvant MammoSite years which was finished in March 2008.  CURRENT THERAPY: Watchful observation.  INTERVAL HISTORY: [name] 66 y.o. female returns for regular followup. She had a ventral incisional hernia from a recent history of abdominal aneurysm repair. She has been due to to do well. She denies any node enlargement. She does not perform routine monthly breast exam; however, she doesn't 3 times a year. She has not found any abnormal breast mass, pain, skin thickening, erythema.  Patient denies fever, anorexia, weight loss, fatigue, headache, visual changes, confusion, drenching night sweats, palpable lymph node swelling, mucositis, odynophagia, dysphagia, nausea vomiting, jaundice, chest pain, palpitation, shortness of breath, dyspnea on exertion, productive cough, gum bleeding, epistaxis, hematemesis, hemoptysis, abdominal pain, abdominal swelling, early satiety, melena, hematochezia, hematuria, skin rash, spontaneous bleeding, joint swelling, joint pain, heat or cold intolerance, bowel bladder incontinence, back pain, focal motor weakness, paresthesia, depression, suicidal or homocidal ideation, feeling hopelessness.   Past Medical History  Diagnosis Date  . Hypertension   . Peripheral vascular disease   . Cancer     Right Breast  . Hyperlipidemia   . Seasonal allergies   . Abdominal wall hernia   . Blood transfusion     30+ yrs. ago    Past Surgical History  Procedure Date  . Abdominal hysterectomy   . Vascular surgery 05/15/11   aobifemoral bypass graft  . Breast surgery     right lumpectomy  . Breast surgery     multiple breast surgeries  . Aortobifemoral bpg 05/15/11    Using a 14 x8 bifurcated Dacron Graft  . Dilation and curettage of uterus     3-4 in late 20"s  . Ventral hernia repair 12/26/2011    Procedure: LAPAROSCOPIC VENTRAL HERNIA;  Surgeon: Julie Sportsman, MD;  Location: [facility];  Service: General;  Laterality: N/A;  With Mesh    Current Outpatient Prescriptions  Medication Sig Dispense Refill  . aspirin EC 81 MG tablet Take 81 mg by mouth daily before breakfast.      . desonide (DESOWEN) 0.05 % cream Apply 1 application topically as needed. For rash.      . fexofenadine (ALLEGRA) 180 MG tablet Take 180 mg by mouth daily as needed. For allergies.      . hydrochlorothiazide (HYDRODIURIL) 25 MG tablet Take 25 mg by mouth daily.       [name] ketotifen (ZADITOR) 0.025 % ophthalmic solution Place 1 drop into both eyes daily as needed. For dry/itchy allergy eyes.      [name] triamcinolone cream (KENALOG) 0.1 % Apply 1 application topically as needed. For rash.      . docusate sodium (STOOL SOFTENER) 100 MG capsule Take 100 mg by mouth 4 (four) times a week.      [name] DISCONTD: potassium chloride (K-DUR) 10 MEQ tablet Take 2 tablets (20 mEq total) by mouth 2 (two) times daily.  30 tablet  2    ALLERGIES:  is allergic to latex.  REVIEW OF SYSTEMS:  The rest of the 14-point review of system was negative.   Filed Vitals:   05/03/12 1259  BP: 128/85  Pulse: 89  Temp: 97.5 F (36.4 C)   Wt Readings from Last 3 Encounters:  05/03/12 155 lb 4.8 oz (70.444 kg)  01/27/12 151 lb (68.493 kg)  12/26/11 156 lb 6.4 oz (70.943 kg)   ECOG Performance status: 0  PHYSICAL EXAMINATION:   General:  well-nourished woman, in no acute distress.  Eyes:  no scleral icterus.  ENT:  There were no oropharyngeal lesions.  Neck was without thyromegaly.  Lymphatics:  Negative cervical, supraclavicular or axillary adenopathy.   Respiratory: lungs were clear bilaterally without wheezing or crackles.  Cardiovascular:  Regular rate and rhythm, S1/S2, without murmur, rub or gallop.  There was no pedal edema.  GI:  abdomen was soft, flat, nontender, nondistended, without organomegaly.  Muscoloskeletal:  no spinal tenderness of palpation of vertebral spine.  Skin exam was without echymosis, petichae.  Neuro exam was nonfocal.  Patient was able to get on and off exam table without assistance.  Gait was normal.  Patient was alerted and oriented.  Attention was good.   Language was appropriate.  Mood was normal without depression.  Speech was not pressured.  Thought content was not tangential.   Bilateral breast exam was negative except for right upper outer quadrant lumpectomy scar. Otherwise the breast exam but likely was negative for any palpable mass, skin thickening, skin dimpling, nipple discharge, nipple inversion, palpable mass or pain.       LABORATORY/RADIOLOGY DATA:  Lab Results  Component Value Date   WBC 4.9 05/03/2012   HGB 11.9 05/03/2012   HCT 37.3 05/03/2012   PLT 181 05/03/2012   GLUCOSE 85 12/19/2011   ALKPHOS 98 09/03/2011   ALT 19 09/03/2011   AST 20 09/03/2011   NA 138 12/19/2011   K 4.0 12/27/2011   CL 100 12/19/2011   CREATININE 0.72 12/27/2011   BUN 13 12/19/2011   CO2 31 12/19/2011   INR 1.06 05/15/2011    ASSESSMENT AND PLAN:   1. History of breast cancer: I discussed with [name] that she does not have any clinical evidence of recurrence or metastatic breast cancer per clinical history, physical exam, lab test. She said that she is up-to-date with a mammogram with [facility] late last year. 2. Hypertension: Well controlled on hydrochlorothiazide per PCP. 3. Age-appropriate cancer screening: She claims that she is up-to-date with her colonoscopy which was done late last year which showed a benign polyps. She thinks the next routine screening colonoscopy is due in about 3-5 years. She had history of  hysterectomy and does not require Pap smear. 4. Disposition: As she is more than 10 years out from the diagnosis of breast cancer and more than 5 years from the finish of adjuvant chemotherapy, her risk of recurrence is just below. As of today, she is not sure this would like to follow with only her PCP or with [place] as well. I gave her appointment to see [place] in about one year. If she changes her mind and wants to follow with only her primary care physician, I advised her to get yearly screening mammogram with [facility] in addition to at least once he is CBC complete metabolic panel. If she has any concern, she is more than welcome to contact us. Our service as always available in the future if the need ever arises.     The length of time of the face-to-face encounter was 10 minutes. More than 50% of time was spent counseling and coordination of care.

## 2012-05-03 NOTE — Telephone Encounter (Signed)
appts made and printed for pt aom °

## 2012-05-11 ENCOUNTER — Telehealth: Payer: Self-pay

## 2012-05-11 DIAGNOSIS — I70219 Atherosclerosis of native arteries of extremities with intermittent claudication, unspecified extremity: Secondary | ICD-10-CM

## 2012-05-11 NOTE — Telephone Encounter (Signed)
Phone call from pt. Reports a 3 week hx. Of pain and numbness in right thigh and calf area.  States the numbness has been continuous, and the pain comes and goes.  States right leg pain has awakened her at night, and has pain when she initially gets up in morning and "it lingers awhile" after she is up moving about.  States swelling in right ankle/lower leg is "minimal".  Denies any open sores.  Last ABI done 11/24/11.  Discussed w/ Dr. Hart Rochester.  Advised to have pt. See another MD this week with ABI's.

## 2012-05-12 ENCOUNTER — Encounter: Payer: Self-pay | Admitting: Vascular Surgery

## 2012-05-12 ENCOUNTER — Ambulatory Visit (INDEPENDENT_AMBULATORY_CARE_PROVIDER_SITE_OTHER): Payer: Medicare Other | Admitting: Vascular Surgery

## 2012-05-12 VITALS — BP 118/71 | HR 68 | Temp 98.2°F | Ht 63.0 in | Wt 159.0 lb

## 2012-05-12 DIAGNOSIS — I70219 Atherosclerosis of native arteries of extremities with intermittent claudication, unspecified extremity: Secondary | ICD-10-CM

## 2012-05-12 DIAGNOSIS — Z48812 Encounter for surgical aftercare following surgery on the circulatory system: Secondary | ICD-10-CM

## 2012-05-12 DIAGNOSIS — M79609 Pain in unspecified limb: Secondary | ICD-10-CM

## 2012-05-12 DIAGNOSIS — I739 Peripheral vascular disease, unspecified: Secondary | ICD-10-CM

## 2012-05-12 NOTE — Progress Notes (Signed)
[facility] [place]  Patient name: [name] MRN: [id number] DOB: July 19, 1946 Sex: female  REASON FOR VISIT: right leg pain  HPI: [name] is a 66 y.o. female who underwent an aortobifemoral bypass graft by Dr. Myra Gianotti on 05/15/2011. She had been doing well until approximately 3 weeks ago she noted the gradual onset of some pain in her right thigh and right calf. She notes that this occurs with ambulation. However, she also experiences these symptoms simply with standing and sitting. She denies any history of back pain. Her symptoms are moderate in severity. There really no other aggravating or alleviating factors. She was concerned about her circulation and comes in for a vascular evaluation.   REVIEW OF SYSTEMS: Arly.Keller ] denotes positive finding; [  ] denotes negative finding  CARDIOVASCULAR:  [ ]  chest pain   [ ]  dyspnea on exertion    CONSTITUTIONAL:  [ ]  fever   [ ]  chills  PHYSICAL EXAM: Filed Vitals:   05/12/12 0854  BP: 118/71  Pulse: 68  Temp: 98.2 F (36.8 C)  TempSrc: Oral  Height: 5\' 3"  (1.6 m)  Weight: 159 lb (72.122 kg)  SpO2: 100%   Body mass index is 28.17 kg/(m^2). GENERAL: The patient is a well-nourished female, in no acute distress. The vital signs are documented above. CARDIOVASCULAR: There is a regular rate and rhythm. I do not detect carotid bruits. She has palpable femoral pulses. She has palpable dorsalis pedis pulses. Both feet are warm and well-perfused. She has no significant lower extremity swelling.  PULMONARY: There is good air exchange bilaterally without wheezing or rales. Her abdominal incision is healed nicely.  I have independently interpreted her arterial Doppler study today which shows that she has triphasic Doppler signals in the dorsalis pedis and posterior tibial positions bilaterally with an ABI of 100% bilaterally.  MEDICAL ISSUES: This patient presents with new onset right leg pain. I reassured her that I do  not think that her leg pain is related to her circulation. Her aortobifemoral bypass graft is patent. She has palpable femoral pulses and palpable pedal pulses with normal ABIs and normal Doppler signals in her feet. I've explained that this could potentially be nerve related pain although she does not give any history of back pain. Her symptoms persist she will follow up with her primary care physician to consider further workup. She is scheduled to see Dr. Myra Gianotti back in December and to keep that appointment. I've ordered follow up ABIs for that time. She knows to call sooner if she has problems.  Alajah Witman S [facility] [place] [facility]: [phone]

## 2012-05-12 NOTE — Addendum Note (Signed)
Addended by: Sharee Pimple on: 05/12/2012 09:33 AM   Modules accepted: Orders

## 2012-05-13 ENCOUNTER — Ambulatory Visit: Payer: Medicare Other | Admitting: Family Medicine

## 2012-05-14 ENCOUNTER — Ambulatory Visit (INDEPENDENT_AMBULATORY_CARE_PROVIDER_SITE_OTHER): Payer: Medicare Other | Admitting: Medical

## 2012-05-14 ENCOUNTER — Encounter: Payer: Self-pay | Admitting: Medical

## 2012-05-14 VITALS — BP 110/70 | HR 80 | Temp 98.2°F | Resp 16 | Wt 154.0 lb

## 2012-05-14 DIAGNOSIS — M5430 Sciatica, unspecified side: Secondary | ICD-10-CM

## 2012-05-14 DIAGNOSIS — M79604 Pain in right leg: Secondary | ICD-10-CM

## 2012-05-14 DIAGNOSIS — M79609 Pain in unspecified limb: Secondary | ICD-10-CM

## 2012-05-14 MED ORDER — NAPROXEN SODIUM ER 375 MG PO TB24: 1.0000 | ORAL_TABLET | Freq: Every day | ORAL | Status: DC

## 2012-05-14 NOTE — Patient Instructions (Signed)
Sciatica Sciatica is a weakness and/or changes in sensation (tingling, jolts, hot and cold, numbness) along the path the sciatic nerve travels. Irritation or damage to lumbar nerve roots is often also referred to as lumbar radiculopathy.  Lumbar radiculopathy (Sciatica) is the most common form of this problem. Radiculopathy can occur in any of the nerves coming out of the spinal cord. The problems caused depend on which nerves are involved. The sciatic nerve is the large nerve supplying the branches of nerves going from the hip to the toes. It often causes a numbness or weakness in the skin and/or muscles that the sciatic nerve serves. It also may cause symptoms (problems) of pain, burning, tingling, or electric shock-like feelings in the path of this nerve. This usually comes from injury to the fibers that make up the sciatic nerve. Some of these symptoms are low back pain and/or unpleasant feelings in the following areas:  From the mid-buttock down the back of the leg to the back of the knee.   And/or the outside of the calf and top of the foot.   And/or behind the inner ankle to the sole of the foot.  CAUSES   Herniated or slipped disc. Discs are the little cushions between the bones in the back.   Pressure by the piriformis muscle in the buttock on the sciatic nerve (Piriformis Syndrome).   Misalignment of the bones in the lower back and buttocks (Sacroiliac Joint Derangement).   Narrowing of the spinal canal that puts pressure on or pinches the fibers that make up the sciatic nerve.   A slipped vertebra that is out of line with those above or beneath it.   Abnormality of the nervous system itself so that nerve fibers do not transmit signals properly, especially to feet and calves (neuropathy).   Tumor (this is rare).  Your caregiver can usually determine the cause of your sciatica and begin the treatment most likely to help you. TREATMENT  Taking over-the-counter painkillers, physical  therapy, rest, exercise, spinal manipulation, and injections of anesthetics and/or steroids may be used. Surgery, acupuncture, and Yoga can also be effective. Mind over matter techniques, mental imagery, and changing factors such as your bed, chair, desk height, posture, and activities are other treatments that may be helpful. You and your caregiver can help determine what is best for you. With proper diagnosis, the cause of most sciatica can be identified and removed. Communication and cooperation between your caregiver and you is essential. If you are not successful immediately, do not be discouraged. With time, a proper treatment can be found that will make you comfortable. HOME CARE INSTRUCTIONS   If the pain is coming from a problem in the back, applying ice to that area for 15 to 20 minutes, 3 to 4 times per day while awake, may be helpful. Put the ice in a plastic bag. Place a towel between the bag of ice and your skin.   You may exercise or perform your usual activities if these do not aggravate your pain, or as suggested by your caregiver.   Only take over-the-counter or prescription medicines for pain, discomfort, or fever as directed by your caregiver.   If your caregiver has given you a follow-up appointment, it is very important to keep that appointment. Not keeping the appointment could result in a chronic or permanent injury, pain, and disability. If there is any problem keeping the appointment, you must call back to this facility for assistance.  SEEK IMMEDIATE MEDICAL CARE   IF:   You experience loss of control of bowel or bladder.   You have increasing weakness in the trunk, buttocks, or legs.   There is numbness in any areas from the hip down to the toes.   You have difficulty walking or keeping your balance.   You have any of the above, with fever or forceful vomiting.  Document Released: 10/28/2001 Document Revised: 10/23/2011 Document Reviewed: 06/16/2008 [facility] Patient  Information 2012 [place], [place].   Home Care: Use the samples of Naprelan, once daily for the next 4 days.  After that you can use OTC Aleve as needed, preferably not every day though.   Begin doing stretching at least 3 days per week, and try some other exercise such as stationary bike and walking outside instead of the track.

## 2012-05-14 NOTE — Progress Notes (Signed)
  Subjective:   HPI  [name] is a 66 y.o. female who presents for leg pain.  She notes that she began having right leg pain 3 weeks ago.  She has a hx/o peripheral vascular disease, went to her vascular doctor and had repeat blood flow studies last week given the pain.  She notes hx/o iliac bypass bilat last year.   She notes that vascular didn't feel that this was claudication pain.  Advised she come here.  She reports intermittent pain down back of right leg.  Worse with walking, but not necessarily relieved with rest.  Pain will radiate down the leg to lower leg.  Denies back pain.  Sometimes gets tingling and numb sensation down the leg. Using nothing for the pain.  She does typically exercise with 1.5 miles of waling on track at the [facility] 3 days per week.  No other aggravating or relieving factors.    No other c/o.  The following portions of the patient's history were reviewed and updated as appropriate: allergies, current medications, past family history, past medical history, past social history, past surgical history and problem list.  Past Medical History  Diagnosis Date  . Hypertension   . Peripheral vascular disease   . Cancer     Right Breast  . Hyperlipidemia   . Seasonal allergies   . Abdominal wall hernia   . Blood transfusion     30+ yrs. ago    Allergies  Allergen Reactions  . Latex Rash     Review of Systems ROS reviewed and was negative other than noted in HPI or above.    Objective:   Physical Exam  General appearance: alert, no distress, WD/WN Back: nontender, normal ROM MSK: nontender, no joint swelling, no obvious deformity, normal hip, knee and ankle ROM, nontender on exam or with ROM Neuro: normal LE strength, sensation and DTRs Pulses: 2+ symmetric, upper and lower extremities, normal cap refill   Assessment and Plan :     Encounter Diagnoses  Name Primary?  . Sciatic pain Yes  . Leg pain, right    Advised daily stretching routine,  demonstrated stretches, advised she try a variety of exercise including stationary bike in addition to walking.  Recommended she also alternate directions on the track, since she just goes in one direction at the [facility] track.  Can use Aleve OTC prn, but I am going to have her try some Naprelan samples given today.  Recheck if not improving.

## 2012-08-10 ENCOUNTER — Other Ambulatory Visit: Payer: Self-pay | Admitting: Family Medicine

## 2012-08-10 NOTE — Telephone Encounter (Signed)
PATIENT NEEDS TO SCHEDULE A OFFICE VISIT BEFORE THIS MEDICATION RUNS OUT.  

## 2012-08-21 ENCOUNTER — Other Ambulatory Visit: Payer: Self-pay | Admitting: Family Medicine

## 2012-08-23 NOTE — Telephone Encounter (Signed)
IS THIS OK 

## 2012-09-24 ENCOUNTER — Encounter: Payer: Self-pay | Admitting: Internal Medicine

## 2012-10-04 ENCOUNTER — Encounter: Payer: Medicare Other | Admitting: Family Medicine

## 2012-10-13 ENCOUNTER — Encounter: Payer: Self-pay | Admitting: Family Medicine

## 2012-10-13 ENCOUNTER — Ambulatory Visit (INDEPENDENT_AMBULATORY_CARE_PROVIDER_SITE_OTHER): Payer: Medicare Other | Admitting: Family Medicine

## 2012-10-13 VITALS — BP 116/70 | HR 86 | Wt 155.0 lb

## 2012-10-13 DIAGNOSIS — Z720 Tobacco use: Secondary | ICD-10-CM

## 2012-10-13 DIAGNOSIS — Z853 Personal history of malignant neoplasm of breast: Secondary | ICD-10-CM

## 2012-10-13 DIAGNOSIS — I739 Peripheral vascular disease, unspecified: Secondary | ICD-10-CM

## 2012-10-13 DIAGNOSIS — F17200 Nicotine dependence, unspecified, uncomplicated: Secondary | ICD-10-CM

## 2012-10-13 DIAGNOSIS — I10 Essential (primary) hypertension: Secondary | ICD-10-CM

## 2012-10-13 MED ORDER — HYDROCHLOROTHIAZIDE 25 MG PO TABS: 25.0000 mg | ORAL_TABLET | Freq: Every day | ORAL | Status: DC

## 2012-10-13 NOTE — Progress Notes (Signed)
  Subjective:    Patient ID: [name], female    DOB: 04/18/1946, 66 y.o.   MRN: [id number]  HPI She is here for a recheck. She continues on her blood pressure medication and is having no difficulty with this. She has had vascular surgery done within the last year. She did have difficulty with a ventral hernia but states that this is doing well. She has seen her oncologist within the last several months. She mainly is under a lot of stress due to family related issues. She continues to work on her smoking cessation and slowly is cutting back on this. She is essentially down to one half pack per day. She did have a DEXA scan done several years ago which was negative.   Review of Systems     Objective:   Physical Exam Alert and in no distress. Blood pressure is recorded.       Assessment & Plan:   1. PVD (peripheral vascular disease)    2. Tobacco abuse    3. Hypertension  hydrochlorothiazide (HYDRODIURIL) 25 MG tablet  4. History of breast cancer     the remainder of the time was spent discussing stress and stress management and also learning to say no to her relatives who rely upon her for more things that are necessary. 30 minutes spent discussing all these issues with her.

## 2012-10-19 ENCOUNTER — Ambulatory Visit (INDEPENDENT_AMBULATORY_CARE_PROVIDER_SITE_OTHER): Payer: Medicare Other | Admitting: Medical

## 2012-10-19 ENCOUNTER — Telehealth: Payer: Self-pay | Admitting: Family Medicine

## 2012-10-19 ENCOUNTER — Encounter: Payer: Self-pay | Admitting: Medical

## 2012-10-19 VITALS — BP 112/70 | HR 72 | Temp 98.3°F | Resp 16 | Wt 160.0 lb

## 2012-10-19 DIAGNOSIS — R200 Anesthesia of skin: Secondary | ICD-10-CM

## 2012-10-19 DIAGNOSIS — R29898 Other symptoms and signs involving the musculoskeletal system: Secondary | ICD-10-CM

## 2012-10-19 DIAGNOSIS — F17200 Nicotine dependence, unspecified, uncomplicated: Secondary | ICD-10-CM

## 2012-10-19 DIAGNOSIS — I739 Peripheral vascular disease, unspecified: Secondary | ICD-10-CM

## 2012-10-19 DIAGNOSIS — R209 Unspecified disturbances of skin sensation: Secondary | ICD-10-CM

## 2012-10-19 LAB — CBC WITH DIFFERENTIAL/PLATELET
Eosinophils Relative: 2 % (ref 0–5)
HCT: 38.3 % (ref 36.0–46.0)
Lymphocytes Relative: 47 % — ABNORMAL HIGH (ref 12–46)
Lymphs Abs: 3.2 10*3/uL (ref 0.7–4.0)
MCV: 85.3 fL (ref 78.0–100.0)
Neutro Abs: 3.1 10*3/uL (ref 1.7–7.7)
Platelets: 203 10*3/uL (ref 150–400)
RBC: 4.49 MIL/uL (ref 3.87–5.11)
WBC: 6.8 10*3/uL (ref 4.0–10.5)

## 2012-10-19 LAB — COMPREHENSIVE METABOLIC PANEL
Alkaline Phosphatase: 103 U/L (ref 39–117)
BUN: 15 mg/dL (ref 6–23)
CO2: 28 mEq/L (ref 19–32)
Creat: 0.84 mg/dL (ref 0.50–1.10)
Glucose, Bld: 76 mg/dL (ref 70–99)
Sodium: 140 mEq/L (ref 135–145)
Total Bilirubin: 0.4 mg/dL (ref 0.3–1.2)
Total Protein: 7.3 g/dL (ref 6.0–8.3)

## 2012-10-19 LAB — PROTIME-INR: INR: 0.95 (ref <1.50)

## 2012-10-19 LAB — LIPID PANEL
HDL: 54 mg/dL (ref >39)
Total CHOL/HDL Ratio: 3.8 Ratio
Triglycerides: 127 mg/dL (ref <150)

## 2012-10-19 NOTE — Telephone Encounter (Signed)
PATIENT IS AWARE OF HER APPOINTMENT TO HAVE A CAROTID U/S AT [facility] ON 11/08/12 @ 115 PM. CLS

## 2012-10-19 NOTE — Progress Notes (Signed)
Subjective; Here today with c/o right arm weakness and numbness on Sunday.  She has hx/o peripheral vascular disease, tobacco use, HTN, hyperlipidemia, prior arterial grafting of lower extremities due to PVD.  Here for c/o right arm feeling like "dead weight" on Sunday 2 days ago.  Was seated at rest, dosed off and was lying on left side.   When she awoke, her right arm seemed numb, weak, and useless like dead weight.  This went on about 2-3 minutes, but gradually seems to regain sensation and strength, but not with usual pins and needles feeling first.  She had to use the left arm to raise the right.  She notes in the past has had milder similar numb feelings of the right arm, but not like this.  Always attributed this to surgery changes since she had partial mastectomy on the right for breast cancer.  She denies any new lumps.  Has yearly mammogram coming up soon within the next month. She denies any other symptom at that time.  No vision or hearing changes, headache, dizziness, slurred speech, fall, or other numbness, tingling, weakness.   She did feel a slight palpitation around this time.   No CP, SOB, DOE, leg edema.  Denies neck pain. Currently arm feels mostly back to normal.   Past Medical History  Diagnosis Date  . Hypertension   . Peripheral vascular disease   . Cancer     Right Breast  . Hyperlipidemia   . Seasonal allergies   . Abdominal wall hernia   . Blood transfusion     30 + yrs. ago   Past Surgical History  Procedure Date  . Abdominal hysterectomy   . Vascular surgery 05/15/11    aobifemoral bypass graft  . Breast surgery     right lumpectomy  . Breast surgery     multiple breast surgeries  . Aortobifemoral bpg 05/15/11    Using a 14 x8 bifurcated Dacron Graft  . Dilation and curettage of uterus     3-4 in late 20"s  . Ventral hernia repair 12/26/2011    Procedure: LAPAROSCOPIC VENTRAL HERNIA;  Surgeon: Ardeth Sportsman, MD;  Location: [facility];  Service: General;   Laterality: N/A;  With Mesh  . Colonoscopy 2012    [facility]    ROS as in HPI   Objective:   Physical Exam  Filed Vitals:   10/19/12 1008  BP: 112/70  Pulse: 72  Temp: 98.3 F (36.8 C)  Resp: 16    General appearance: alert, no distress, WD/WN Oral cavity: MMM, no lesions Neck: supple, no lymphadenopathy, no thyromegaly, no masses, normal ROM, no bruits, no JVD Heart: RRR, normal S1, S2, no murmurs Lungs: CTA bilaterally, no wheezes, rhonchi, or rales Back: non tender Musculoskeletal: nontender, normal ROM, no swelling, no obvious deformity of UE Extremities: no edema, no cyanosis, no clubbing Pulses: 2+ symmetric, upper and lower extremities, normal cap refill Neurological: alert, oriented x 3, CN2-12 intact, strength normal upper extremities and lower extremities, sensation normal throughout, DTRs 2+ throughout, no cerebellar signs, gait normal, nonfocal exam Psychiatric: normal affect, behavior normal, pleasant    Adult ECG Report  Indication: unilateral weakness, hx/o PVD  Rate: 70 bpm  Rhythm: normal sinus rhythm  QRS Axis: 37 degrees  PR Interval: 188 ms  QRS Duration: 80 ms  QTc: 429 ms  Conduction Disturbances: none  Other Abnormalities: none  Patient's cardiac risk factors are: advanced age (older than 95 for men, 40 for women), dyslipidemia, hypertension and  smoking/ tobacco exposure.  EKG comparison: 05/13/11   Narrative Interpretation: no acute change.  Normal EKG.   Assessment and Plan :     Encounter Diagnoses  Name Primary?  . Right arm weakness Yes  . Right arm numbness   . PVD (peripheral vascular disease)   . Tobacco use disorder    Discussed case with Dr. Susann Givens, supervising physician.  Disused symptoms suggestive of TIA.  Of note, she sees vascular specialist, cardiology.   We will set up carotid dopplers.  discussed need to stop tobacco and discussed the risks of tobacco including its relationship to PVD.  Will likely put her on statin.  Labs  today.  Increase to 325mg  Aspirin daily.  Follow-up pending labs, call 911 if recurrence of symptoms or other symptoms of paresthesias, CP, DOE, syncope, etc., as discussed.

## 2012-10-20 ENCOUNTER — Ambulatory Visit
Admission: RE | Admit: 2012-10-20 | Discharge: 2012-10-20 | Disposition: A | Discharge status: Home / Self Care | Payer: Medicare Other | Source: Ambulatory Visit | Attending: Medical | Admitting: Medical

## 2012-10-20 DIAGNOSIS — R29898 Other symptoms and signs involving the musculoskeletal system: Secondary | ICD-10-CM

## 2012-10-20 DIAGNOSIS — R200 Anesthesia of skin: Secondary | ICD-10-CM

## 2012-10-22 ENCOUNTER — Other Ambulatory Visit: Payer: Self-pay | Admitting: Medical

## 2012-10-22 MED ORDER — ATORVASTATIN CALCIUM 20 MG PO TABS: 20.0000 mg | ORAL_TABLET | Freq: Every day | ORAL | Status: DC

## 2012-11-08 ENCOUNTER — Ambulatory Visit: Payer: Medicare Other | Admitting: Surgery

## 2012-11-08 ENCOUNTER — Encounter (HOSPITAL_COMMUNITY): Payer: Medicare Other

## 2012-11-18 ENCOUNTER — Telehealth: Payer: Self-pay | Admitting: Family Medicine

## 2012-11-18 NOTE — Telephone Encounter (Signed)
Pt called and said she has called here 3 times and can't get a response.  She said she went to Dr. Allyson Sabal and he couldn't really do anything as he did not have any records from [place].  Epic shows we sent ov, labs, etc to Dr. Allyson Sabal on 10/22/12 and her appt was on 12/23.  I have called twice to Dr. Allyson Sabal office to speak with nurse Bronson Curb and have left a message.    I also called pt back to let her know a status.

## 2012-11-18 NOTE — Telephone Encounter (Signed)
Dr. Hazle Coca nurse Samara Deist called and states they printed off the u/s and ov from Epic on 12/19 and her ov was 12/20.  They did not receive a referral from [place], so the doc thought this was pt request.  Dr. Allyson Sabal has circled some things on the [place]  He is out of office today.  Samara Deist will talk with him tomorrow and call me back with further information or instructions.  Called pt advised of same.

## 2012-11-19 NOTE — Telephone Encounter (Signed)
I spoke with Kristian Covey PA-C  and made him aware of what Dr. Hazle Coca nurse said and what Dr. Allyson Sabal has scheduled for the patient . CLS

## 2012-11-19 NOTE — Telephone Encounter (Signed)
[name] with Dr. Allyson Sabal office called and said for some reason Dr. Allyson Sabal did not see [name]'s notes, he only saw Carotid doppler day of visit and was not too concerned.  [name] had brought the note and TIA info to Dr Allyson Sabal and he wants to rule out Cardiac Event, he has ordered ECHO, Event Monitor and Plavix.  [name] will call pt and explain all to her.

## 2012-11-19 NOTE — Telephone Encounter (Signed)
I'm not sure what to make of this.   Per 10/19/12 lab notes from me, chandra reported results to patient, made the referral, sent over the referral, my notes, my labs, etc.  So I'm not sure where things are getting lost in the process.  This is the last time we have spoke to pt.    As far as her calling 3 times, this is the first message I've seen since the last visit.  The next step was cardiac eval which apparently just took place.  Thus, we will need to review cardiology's notes and recommendations.  I'll see if there is anything so far in the chart to review that we may have received. chandra - pls pull the paper chart

## 2012-11-22 ENCOUNTER — Other Ambulatory Visit (HOSPITAL_COMMUNITY): Payer: Self-pay | Admitting: Cardiovascular Disease

## 2012-11-22 DIAGNOSIS — G459 Transient cerebral ischemic attack, unspecified: Secondary | ICD-10-CM

## 2012-11-26 ENCOUNTER — Ambulatory Visit (HOSPITAL_COMMUNITY)
Admission: RE | Admit: 2012-11-26 | Discharge: 2012-11-26 | Disposition: A | Discharge status: Home / Self Care | Payer: Medicare Other | Source: Ambulatory Visit | Attending: Cardiovascular Disease | Admitting: Cardiovascular Disease

## 2012-11-26 DIAGNOSIS — G459 Transient cerebral ischemic attack, unspecified: Secondary | ICD-10-CM | POA: Insufficient documentation

## 2012-11-26 DIAGNOSIS — F17200 Nicotine dependence, unspecified, uncomplicated: Secondary | ICD-10-CM | POA: Insufficient documentation

## 2012-11-26 DIAGNOSIS — I739 Peripheral vascular disease, unspecified: Secondary | ICD-10-CM | POA: Insufficient documentation

## 2012-11-26 DIAGNOSIS — I10 Essential (primary) hypertension: Secondary | ICD-10-CM | POA: Insufficient documentation

## 2012-11-26 NOTE — Progress Notes (Signed)
[facility]   2D echo completed 11/26/2012.   Cindy Allyna Pittsley, RDCS   

## 2013-01-06 ENCOUNTER — Other Ambulatory Visit (HOSPITAL_COMMUNITY): Payer: Self-pay | Admitting: Cardiovascular Disease

## 2013-01-06 DIAGNOSIS — E041 Nontoxic single thyroid nodule: Secondary | ICD-10-CM

## 2013-01-07 ENCOUNTER — Ambulatory Visit (INDEPENDENT_AMBULATORY_CARE_PROVIDER_SITE_OTHER): Payer: Medicare Other | Admitting: Family Medicine

## 2013-01-07 VITALS — BP 114/70 | HR 97 | Wt 164.0 lb

## 2013-01-07 DIAGNOSIS — I6521 Occlusion and stenosis of right carotid artery: Secondary | ICD-10-CM

## 2013-01-07 DIAGNOSIS — E041 Nontoxic single thyroid nodule: Secondary | ICD-10-CM

## 2013-01-07 DIAGNOSIS — I739 Peripheral vascular disease, unspecified: Secondary | ICD-10-CM

## 2013-01-07 DIAGNOSIS — I6529 Occlusion and stenosis of unspecified carotid artery: Secondary | ICD-10-CM

## 2013-01-07 DIAGNOSIS — I70219 Atherosclerosis of native arteries of extremities with intermittent claudication, unspecified extremity: Secondary | ICD-10-CM

## 2013-01-07 DIAGNOSIS — Z8673 Personal history of transient ischemic attack (TIA), and cerebral infarction without residual deficits: Secondary | ICD-10-CM | POA: Insufficient documentation

## 2013-01-07 DIAGNOSIS — G459 Transient cerebral ischemic attack, unspecified: Secondary | ICD-10-CM | POA: Insufficient documentation

## 2013-01-07 NOTE — Progress Notes (Signed)
  Subjective:    Patient ID: [name], female    DOB: October 01, 1946, 67 y.o.   MRN: [id number]  HPI She is here for consult after recent history consistent with TIA. She was evaluated by cardiology. During evaluation of her carotids, a thyroid nodule as noted and followup ultrasound has been ordered. The note from cardiology dated 2/18was evaluated. She was down to have right carotid stenosis of roughly 60%. This was felt to not be significant enough to cause a TIA. He was recommended that she get followup with neurology. She presently is also on a statin as well as Plavix.she was to make sure that all these tests are truly necessary.   Review of Systems     Objective:   Physical Exam Alert and in no distress otherwise not examined       Assessment & Plan:  Atherosclerosis of native arteries of the extremities with intermittent claudication  TIA (transient ischemic attack)  Thyroid nodule  Carotid stenosis, asymptomatic, right  PVD (peripheral vascular disease)  I discussed the thyroid nodule as an incidental finding but something that we cannot ignore. The carotid stenosis at this time is not a major factor. This will need to be followed. I did explain that I think neurologic evaluation would be appropriate to get their input into this in for followup. Discussed the fact that placing her on a statin as well as Plavix and aspirin is appropriate.

## 2013-01-10 ENCOUNTER — Ambulatory Visit (HOSPITAL_COMMUNITY)
Admission: RE | Admit: 2013-01-10 | Discharge: 2013-01-10 | Disposition: A | Discharge status: Home / Self Care | Payer: Medicare Other | Source: Ambulatory Visit | Attending: Cardiovascular Disease | Admitting: Cardiovascular Disease

## 2013-01-10 DIAGNOSIS — E042 Nontoxic multinodular goiter: Secondary | ICD-10-CM | POA: Insufficient documentation

## 2013-01-10 DIAGNOSIS — E041 Nontoxic single thyroid nodule: Secondary | ICD-10-CM

## 2013-01-24 ENCOUNTER — Other Ambulatory Visit: Payer: Self-pay | Admitting: Medical

## 2013-02-02 ENCOUNTER — Ambulatory Visit (INDEPENDENT_AMBULATORY_CARE_PROVIDER_SITE_OTHER): Payer: Medicare Other | Admitting: Neurology

## 2013-02-02 ENCOUNTER — Encounter: Payer: Self-pay | Admitting: Neurology

## 2013-02-02 VITALS — BP 113/68 | HR 95 | Ht 62.5 in | Wt 165.0 lb

## 2013-02-02 DIAGNOSIS — G459 Transient cerebral ischemic attack, unspecified: Secondary | ICD-10-CM

## 2013-02-02 NOTE — Patient Instructions (Addendum)
NEED TO STOP SMOKING!!!   MRI of the head and MRA of the head will be done.   Transient Ischemic Attack A transient ischemic attack (TIA) is a "warning stroke" that causes stroke-like symptoms. Unlike a stroke, a TIA does not cause permanent damage to the brain. The symptoms of a TIA can happen very fast and do not last long. It is important to know the symptoms of a TIA and what to do. This can help prevent a major stroke or death. CAUSES   A TIA is caused by a temporary blockage in an artery in the brain or neck (carotid artery). The blockage does not allow the brain to get the blood supply it needs and can cause different symptoms. The blockage can be caused by either:  A blood clot.  Fatty buildup (plaque) in a neck or brain artery. SYMPTOMS  TIA symptoms are the same as a stroke but are temporary. Symptoms can include sudden:  Numbness or weakness on one side of the body. Especially to the:  Face.  Arm.  Leg.  Trouble speaking, thinking, or confusion.  Change in vision, such as trouble seeing in one or both eyes.  Dizziness, loss of balance, or difficulty walking.  Severe headache. ANY OF THESE SYMPTOMS MAY REPRESENT A SERIOUS PROBLEM THAT IS AN EMERGENCY. Do not wait to see if the symptoms will go away. Get medical help at once. Call your local emergency services (911 in [place] IMMEDIATELY. DO NOT drive yourself to the hospital. RISK FACTORS Risk factors can increase the risk of developing a TIA. These can include.   High blood pressure (hypertension).  High cholesterol (hyperlipidemia).  Heart disease (atherosclerosis).  Smoking.  Diabetes.  Abnormal heart rhythm (atrial fibrillation).  Family history of a stroke or heart attack.  Use of oral contraceptives (especially when combined with smoking). DIAGNOSIS   A TIA can be diagnosed based on your:  Symptoms.  History.  Risk factors.  Tests that can help diagnose the symptoms of a TIA include:  CT or  MRI scan. These tests can provide detailed images of the brain.  Carotid ultrasound. This test looks to see if there are blockages in the carotid arteries of your neck.  Arteriography. A thin, small flexible tube (catheter) is inserted through a small cut (incision) in your groin. The catheter is threaded to your carotid or vertebral artery. A dye is then injected into the catheter. The dye highlights the arteries in your brain and allows your caregiver to look for narrowing or blockages that can cause a TIA. TREATMENT  Based on the cause of a TIA, treatment options can vary. Treatment is important to help prevent a stroke. Treatment options can include:  Medication. Such as:  Clot-busting medicine.  Anti-platelet medicine.  Blood pressure medicine.  Blood thinner medicine.  Surgery:  Carotid endarterectomy. The carotid arteries are the arteries that supply the head and neck with oxygenated blood. This surgery can help remove fatty deposits (plaque) in the carotid arteries.  Angioplasty and stenting. This surgery uses a balloon to dilate a blocked artery in the brain. A stent is a small, metal mesh tube that can help keep an artery open HOME CARE INSTRUCTIONS   It is important to take all medicine as told by your caregiver. If the medicine has side effects that affect you negatively, tell your caregiver right away. Do not stop taking medicine unless told by your caregiver. Some medicines may need to be changed to better treat your condition.  Do not smoke. Talk to your caregiver on how to quit smoking.  Eat a diet high in fruits, vegetables and lean meat. Avoid a high fat, high salt diet. A dietician can you help you make healthy food choices.  Maintain a healthy weight. Develop an exercise plan approved by your caregiver. SEEK IMMEDIATE MEDICAL CARE IF:   You develop weakness or numbness on one side of your body.  You have problems thinking, speaking, or feel confused.  You  have vision changes.  You feel dizzy, have trouble walking, or lose your balance.  You develop a severe headache. MAKE SURE YOU:   Understand these instructions.  Will watch your condition.  Will get help right away if you are not doing well or get worse. Document Released: 08/13/2005 Document Revised: 01/26/2012 Document Reviewed: 12/27/2009 [facility] Patient Information 2013 [place], [place].

## 2013-02-02 NOTE — Progress Notes (Signed)
Reason for visit: TIA  [name] is a 67 year old female  History of present illness:  [name] is a 67 year old right-handed black female with a history of tobacco abuse, hypertension, and peripheral vascular disease. The patient had an event in December 2013 with a transient episode of right-sided weakness involving the arm. The patient was lying in bed at that time, and she noted that when she tried to move her right arm, it was completely flaccid. The patient did not report any visual change, speech change, or headache. The patient did not note any weakness of the leg. The patient had no numbness of the right arm, only weakness. The episode lasted only a few minutes, and fully cleared. The patient did not seek medical attention at that time. The patient eventually went to her doctor, and she was set up for a carotid Doppler study that showed evidence of 50-69% stenosis of the right internal carotid artery, without significant stenosis on the left side. A 2-D echocardiogram was done, and this did not show evidence of significant heart valve disease with an ejection fraction of 55-60%. The patient has not undergone a scan procedure of the brain. The patient was placed on Plavix in addition to low-dose aspirin. The patient was on aspirin at the time of the TIA event. The patient has not had any recurrence of her symptoms. The patient indicates that she was placed on a CardioNet monitor for one month, and she was told this was normal. The patient is sent to this office for an evaluation.   ROS:  Out of a complete 14 system review of symptoms, the patient complains only of the following symptoms, and all other reviewed systems are negative.  Palpitations Ringing in the ears Spinning sensations Rash Itching Moles Snoring Easy bruising Easy bleeding Even cold Joint pain Cramps Achy muscles Allergies  Skin sensitivity Memory  loss Confusion Numbness Weakness Dizziness Snoring Restless legs Depression Not enough sleep  Decreased energy   Blood pressure 113/68, pulse 95, height 5' 2.5" (1.588 m), weight 165 lb (74.844 kg).  Physical Exam  General: The patient is alert and cooperative at the time of the examination. The patient is minimally obese.  Head: Pupils are equal, round, and reactive to light. Discs are flat bilaterally.  Neck: The neck is supple, no carotid bruits are noted.  Respiratory: The respiratory examination is clear.  Cardiovascular: The cardiovascular examination reveals a regular rate and rhythm, no obvious murmurs or rubs are noted.  Skin: Extremities are without significant edema.  Neurologic Exam  Mental status:  Cranial nerves: Facial symmetry is present. There is good sensation of the face to pinprick and soft touch bilaterally. The strength of the facial muscles and the muscles to head turning and shoulder shrug are normal bilaterally. Speech is well enunciated, no aphasia or dysarthria is noted. Extraocular movements are full. Visual fields are full.  Motor: The motor testing reveals 5 over 5 strength of all 4 extremities. Good symmetric motor tone is noted throughout.  Sensory: Sensory testing is intact to pinprick, soft touch, vibration sensation, and position sense on all 4 extremities. No evidence of extinction is noted.  Coordination: Cerebellar testing reveals good finger-nose-finger and heel-to-shin bilaterally.  Gait and station: Gait is normal. Tandem gait is normal. Romberg is negative. No drift is seen  Reflexes: Deep tendon reflexes are symmetric, but are depressed bilaterally. Toes are downgoing bilaterally.   Assessment/Plan:  One. TIA event  2. Hypertension  3. Dyslipidemia  4. Tobacco abuse  The patient has had a single TIA event several months ago. The patient has done well since that time, but she continues to smoke. I asked her to stop  smoking at this time, and she will be set up for MRI evaluation of the brain, and MRA of the head. I will contact her concerning the results of the above. The patient is to go to emergency room if a recurrence of symptoms is noted. Otherwise, the patient will follow up through this office if needed.    Marlan Palau MD 02/02/2013 10:11 AM

## 2013-02-07 ENCOUNTER — Other Ambulatory Visit: Payer: Self-pay | Admitting: Neurology

## 2013-02-07 DIAGNOSIS — G459 Transient cerebral ischemic attack, unspecified: Secondary | ICD-10-CM

## 2013-02-14 ENCOUNTER — Ambulatory Visit
Admission: RE | Admit: 2013-02-14 | Discharge: 2013-02-14 | Disposition: A | Discharge status: Home / Self Care | Payer: Medicare Other | Source: Ambulatory Visit | Attending: Neurology | Admitting: Neurology

## 2013-02-14 DIAGNOSIS — G459 Transient cerebral ischemic attack, unspecified: Secondary | ICD-10-CM

## 2013-02-15 ENCOUNTER — Encounter: Payer: Self-pay | Admitting: Family Medicine

## 2013-02-16 ENCOUNTER — Telehealth: Payer: Self-pay | Admitting: Neurology

## 2013-02-16 NOTE — Telephone Encounter (Signed)
I called the patient. The MRI of the brain shows mild SVD and the MRA of the head is OK. I discussed this with the patient. She needs to stop smoking.

## 2013-02-17 ENCOUNTER — Telehealth: Payer: Self-pay | Admitting: Neurology

## 2013-02-17 NOTE — Telephone Encounter (Signed)
This note was filed in error.

## 2013-02-22 ENCOUNTER — Other Ambulatory Visit: Payer: Self-pay | Admitting: Medical

## 2013-04-14 LAB — HM MAMMOGRAPHY

## 2013-04-18 ENCOUNTER — Encounter: Payer: Self-pay | Admitting: Internal Medicine

## 2013-05-02 ENCOUNTER — Other Ambulatory Visit (HOSPITAL_BASED_OUTPATIENT_CLINIC_OR_DEPARTMENT_OTHER): Payer: Medicare Other | Admitting: Lab

## 2013-05-02 ENCOUNTER — Other Ambulatory Visit (HOSPITAL_COMMUNITY): Payer: Self-pay | Admitting: Surgery

## 2013-05-02 ENCOUNTER — Ambulatory Visit (HOSPITAL_BASED_OUTPATIENT_CLINIC_OR_DEPARTMENT_OTHER): Payer: Medicare Other | Admitting: Oncology

## 2013-05-02 ENCOUNTER — Telehealth: Payer: Self-pay | Admitting: Oncology

## 2013-05-02 VITALS — BP 129/72 | HR 88 | Temp 97.1°F | Resp 18 | Ht 62.5 in | Wt 161.7 lb

## 2013-05-02 DIAGNOSIS — Z853 Personal history of malignant neoplasm of breast: Secondary | ICD-10-CM

## 2013-05-02 LAB — COMPREHENSIVE METABOLIC PANEL (CC13)
AST: 25 U/L (ref 5–34)
Albumin: 3.4 g/dL — ABNORMAL LOW (ref 3.5–5.0)
BUN: 12.1 mg/dL (ref 7.0–26.0)
CO2: 28 mEq/L (ref 22–29)
Calcium: 9.3 mg/dL (ref 8.4–10.4)
Chloride: 101 mEq/L (ref 98–107)
Creatinine: 0.9 mg/dL (ref 0.6–1.1)
Glucose: 77 mg/dl (ref 70–99)
Potassium: 3.2 mEq/L — ABNORMAL LOW (ref 3.5–5.1)

## 2013-05-02 LAB — CBC WITH DIFFERENTIAL/PLATELET
Basophils Absolute: 0 10*3/uL (ref 0.0–0.1)
Eosinophils Absolute: 0.1 10*3/uL (ref 0.0–0.5)
HCT: 38.9 % (ref 34.8–46.6)
HGB: 12.9 g/dL (ref 11.6–15.9)
MCH: 28.7 pg (ref 25.1–34.0)
MONO#: 0.6 10*3/uL (ref 0.1–0.9)
NEUT#: 3.2 10*3/uL (ref 1.5–6.5)
NEUT%: 47.6 % (ref 38.4–76.8)
RDW: 15.9 % — ABNORMAL HIGH (ref 11.2–14.5)
WBC: 6.7 10*3/uL (ref 3.9–10.3)
lymph#: 2.8 10*3/uL (ref 0.9–3.3)

## 2013-05-02 NOTE — Progress Notes (Signed)
[facility]  Telephone:([phone] Fax:([phone]) [phone]   OFFICE PROGRESS NOTE   Cc:  Julie Herter, MD  DIAGNOSIS: History of 0.7 cm, grade 2, invasive ductal carcinoma, with high-grade DCIS; 2/15 lymph nodes positive; ER positive, PR negative, HER-2/neu negative.  PAST THERAPY: Status post lumpectomy with axillary dissection on other source 2002. She received adjuvant chemotherapy FEC and agitation therapy for adjuvant MammoSite years which was finished in March 2008.  CURRENT THERAPY: Watchful observation.  INTERVAL HISTORY: [name] 67 y.o. female returns for regular follow up by herself.  She reports feeling well.  She denied breast mass/pain/skin thickening.  The rest of the 14-point review of system was negative.    Past Medical History  Diagnosis Date  . Hypertension   . Peripheral vascular disease   . Hyperlipidemia   . Seasonal allergies   . Abdominal wall hernia   . Blood transfusion     30+ yrs. ago  . Cancer     Right Breast    Past Surgical History  Procedure Laterality Date  . Abdominal hysterectomy    . Vascular surgery  05/15/11    aobifemoral bypass graft  . Breast surgery      right lumpectomy  . Breast surgery      multiple breast surgeries  . Aortobifemoral bpg  05/15/11    Using a 14 x8 bifurcated Dacron Graft  . Dilation and curettage of uterus      3-4 in late 20"s  . Ventral hernia repair  12/26/2011    Procedure: LAPAROSCOPIC VENTRAL HERNIA;  Surgeon: Ardeth Sportsman, MD;  Location: [facility];  Service: General;  Laterality: N/A;  With Mesh  . Colonoscopy  2012    [facility]    Current Outpatient Prescriptions  Medication Sig Dispense Refill  . acetaminophen (TYLENOL) 325 MG tablet Take 650 mg by mouth every 6 (six) hours as needed for pain.      [name] aspirin EC 81 MG tablet Take 81 mg by mouth daily before breakfast.      . atorvastatin (LIPITOR) 20 MG tablet take 1 tablet by mouth once daily  30 tablet  3  .  clopidogrel (PLAVIX) 75 MG tablet Take 75 mg by mouth daily.      [name] desonide (DESOWEN) 0.05 % cream Apply 1 application topically as needed. For rash.      . hydrochlorothiazide (HYDRODIURIL) 25 MG tablet Take 25 mg by mouth daily. PT TAKES 1/2 TABLET DAILY      . ketotifen (ZADITOR) 0.025 % ophthalmic solution Place 1 drop into both eyes daily as needed. For dry/itchy allergy eyes.      . naproxen sodium (ANAPROX) 220 MG tablet Take 220 mg by mouth as needed.      . triamcinolone cream (KENALOG) 0.1 % Apply 1 application topically as needed. For rash.      . [DISCONTINUED] potassium chloride (K-DUR) 10 MEQ tablet Take 2 tablets (20 mEq total) by mouth 2 (two) times daily.  30 tablet  2   No current facility-administered medications for this visit.    ALLERGIES:  is allergic to latex.  REVIEW OF SYSTEMS:  The rest of the 14-point review of system was negative.   Filed Vitals:   05/02/13 1007  BP: 129/72  Pulse: 88  Temp: 97.1 F (36.2 C)  Resp: 18   Wt Readings from Last 3 Encounters:  05/02/13 161 lb 11.2 oz (73.347 kg)  02/02/13 165 lb (74.844 kg)  01/07/13  164 lb (74.39 kg)   ECOG Performance status: 0  PHYSICAL EXAMINATION:   General:  well-nourished woman, in no acute distress.  Eyes:  no scleral icterus.  ENT:  There were no oropharyngeal lesions.  Neck was without thyromegaly.  Lymphatics:  Negative cervical, supraclavicular or axillary adenopathy.  Respiratory: lungs were clear bilaterally without wheezing or crackles.  Cardiovascular:  Regular rate and rhythm, S1/S2, without murmur, rub or gallop.  There was no pedal edema.  GI:  abdomen was soft, flat, nontender, nondistended, without organomegaly.  Muscoloskeletal:  no spinal tenderness of palpation of vertebral spine.  Skin exam was without echymosis, petichae.  Neuro exam was nonfocal.  Patient was able to get on and off exam table without assistance.  Gait was normal.  Patient was alerted and oriented.  Attention was  good.   Language was appropriate.  Mood was normal without depression.  Speech was not pressured.  Thought content was not tangential.   Bilateral breast exam was negative except for right upper outer quadrant lumpectomy scar. Otherwise the breast exam but likely was negative for any palpable mass, skin thickening, skin dimpling, nipple discharge, nipple inversion, palpable mass or pain.       LABORATORY/RADIOLOGY DATA:  Lab Results  Component Value Date   WBC 6.7 05/02/2013   HGB 12.9 05/02/2013   HCT 38.9 05/02/2013   PLT 193 05/02/2013   GLUCOSE 77 05/02/2013   CHOL 203* 10/19/2012   TRIG 127 10/19/2012   HDL 54 10/19/2012   LDLCALC 124* 10/19/2012   ALKPHOS 124 05/02/2013   ALT 27 05/02/2013   AST 25 05/02/2013   NA 139 05/02/2013   K 3.2* 05/02/2013   CL 101 05/02/2013   CREATININE 0.9 05/02/2013   BUN 12.1 05/02/2013   CO2 28 05/02/2013   INR 0.95 10/19/2012    ASSESSMENT AND PLAN:   1. History of breast cancer:  Continue to be in remission.  Her last mammogram in 03/2013 with [facility] was negative.  Her next one is due in 03/2014.   2. Follow up:  In about 1 year.  Even though she is more than 10 years out from the diagnosis of breast cancer, she still prefers to follow up with [facility].     I informed [name] that I am leaving the practice.  The [facility] will arrange for her to see another provider when she returns.   The length of time of the face-to-face encounter was 10 minutes. More than 50% of time was spent counseling and coordination of care.

## 2013-05-02 NOTE — Telephone Encounter (Signed)
gv and printed appt sched and avs for pt....[facility] June 2nd @ 10 am

## 2013-05-10 ENCOUNTER — Telehealth (HOSPITAL_COMMUNITY): Payer: Self-pay | Admitting: Cardiovascular Disease

## 2013-05-10 ENCOUNTER — Other Ambulatory Visit (HOSPITAL_COMMUNITY): Payer: Self-pay | Admitting: Cardiovascular Disease

## 2013-05-10 DIAGNOSIS — I739 Peripheral vascular disease, unspecified: Secondary | ICD-10-CM

## 2013-05-10 NOTE — Telephone Encounter (Signed)
LEFT MESSAGE TO CALL AND SCHEDULE APPT FOR TESTING ORDERED BY JB

## 2013-06-01 ENCOUNTER — Telehealth: Payer: Self-pay | Admitting: Cardiovascular Disease

## 2013-06-01 MED ORDER — CLOPIDOGREL BISULFATE 75 MG PO TABS: 75.0000 mg | ORAL_TABLET | Freq: Every day | ORAL | Status: DC

## 2013-06-01 NOTE — Telephone Encounter (Signed)
Returned call from [place] at [facility].  Informed 90-day supplies can be sent and asked which meds needed.  Stated pt only needs Plavix.  Agreed w/ plan.  Refill(s) sent to pharmacy.

## 2013-06-01 NOTE — Telephone Encounter (Signed)
Weston Brass is calling because Mrs. [employer] will only pay for a 90 day prescription and it was written for 30 days.. Would like to know if we can rewrite the prescription for 90 days  Thanks

## 2013-06-03 ENCOUNTER — Telehealth: Payer: Self-pay | Admitting: Family Medicine

## 2013-06-03 NOTE — Telephone Encounter (Signed)
Pt called and stated that she was informed by her dentist, Dr. Terence Lux, that she has growth in her mouth. Pt was informed to contact her dentist and request that all information pertaining to that be faxed over to [place]. This is a fyi to you to let you know those records will be coming. Pt called back and stated that her dentist's office is closed today and she would take care of that on Monday.

## 2013-06-08 ENCOUNTER — Ambulatory Visit (INDEPENDENT_AMBULATORY_CARE_PROVIDER_SITE_OTHER): Payer: Medicare Other | Admitting: Family Medicine

## 2013-06-08 VITALS — BP 120/70 | HR 98 | Wt 160.0 lb

## 2013-06-08 DIAGNOSIS — K1370 Unspecified lesions of oral mucosa: Secondary | ICD-10-CM

## 2013-06-08 NOTE — Telephone Encounter (Signed)
WAS INFORMED BY PT THAT MEDICARE STATES THIS MUST GO THRU MEDICAL AND NOT DENTAL FOR IT TO BE COVERED. PT SCHEDULED AN APPT WITH [facility] FOR 06/08/2013

## 2013-06-08 NOTE — Progress Notes (Signed)
  Subjective:    Patient ID: [name], female    DOB: 03/19/1946, 67 y.o.   MRN: [id number]  HPI She is here for evaluation of a whitish lesion present on the right lower jaw near tooth #29. Her dentist would like her seen by an oral Careers adviser. Multiple teeth are missing. Also several are decayed   Review of Systems     Objective:   Physical Exam A 3 mm white round plaque-like lesion is noted where tooth 29 was.       Assessment & Plan:  Oral mucosal lesion  I will refer to oral surgery.

## 2013-06-09 ENCOUNTER — Telehealth: Payer: Self-pay | Admitting: Family Medicine

## 2013-06-09 NOTE — Telephone Encounter (Signed)
Advised pt of consult appt with Dr. Dutch Quint of [facility] [address]   [address]   [phone].  1st appt is consul on Aug 4th at 3:45.  [facility] advised no dental would do biopsy under medical.

## 2013-06-09 NOTE — Addendum Note (Signed)
Addended by: Laureen Ochs F on: 06/09/2013 11:23 AM   Modules accepted: Orders

## 2013-06-20 ENCOUNTER — Ambulatory Visit: Payer: Medicare Other | Admitting: Cardiovascular Disease

## 2013-07-22 ENCOUNTER — Telehealth (HOSPITAL_COMMUNITY): Payer: Self-pay | Admitting: *Deleted

## 2013-07-26 ENCOUNTER — Encounter (HOSPITAL_COMMUNITY): Payer: Self-pay | Admitting: *Deleted

## 2013-08-18 ENCOUNTER — Telehealth (HOSPITAL_COMMUNITY): Payer: Self-pay | Admitting: *Deleted

## 2013-08-18 NOTE — Telephone Encounter (Signed)
Order for dopplers was discontinued

## 2013-08-18 NOTE — Telephone Encounter (Signed)
[facility] LOC[facility] AT [facility] [address] [place], [place] [address] [phone]   Date:08/18/2013  Dear Dr. Allyson Sabal Our office has attempted to contact your patient [name]  twice by telephone and we have also sent an appointment letter to schedule the Lower Extremity Arterial Doppler test you ordered. The patient has not responded. We will not make any further attempts to contact the patient. If any further assistance is needed for this referral, please contact our office at [phone]  Sincerely, [facility] Scheduling Team

## 2013-09-27 ENCOUNTER — Encounter: Payer: Self-pay | Admitting: Family Medicine

## 2013-10-03 ENCOUNTER — Encounter: Payer: Self-pay | Admitting: *Deleted

## 2013-10-26 ENCOUNTER — Other Ambulatory Visit: Payer: Self-pay | Admitting: Family Medicine

## 2013-11-05 ENCOUNTER — Telehealth: Payer: Self-pay | Admitting: Family Medicine

## 2013-11-05 NOTE — Telephone Encounter (Signed)
lm

## 2013-11-28 ENCOUNTER — Other Ambulatory Visit: Payer: Self-pay | Admitting: Family Medicine

## 2013-12-01 ENCOUNTER — Encounter: Payer: Self-pay | Admitting: Family Medicine

## 2013-12-01 ENCOUNTER — Ambulatory Visit (INDEPENDENT_AMBULATORY_CARE_PROVIDER_SITE_OTHER): Payer: Medicare HMO | Admitting: Family Medicine

## 2013-12-01 VITALS — BP 110/74 | HR 85 | Ht 63.0 in | Wt 156.0 lb

## 2013-12-01 DIAGNOSIS — Z853 Personal history of malignant neoplasm of breast: Secondary | ICD-10-CM

## 2013-12-01 DIAGNOSIS — I70219 Atherosclerosis of native arteries of extremities with intermittent claudication, unspecified extremity: Secondary | ICD-10-CM

## 2013-12-01 DIAGNOSIS — J301 Allergic rhinitis due to pollen: Secondary | ICD-10-CM

## 2013-12-01 DIAGNOSIS — I739 Peripheral vascular disease, unspecified: Secondary | ICD-10-CM

## 2013-12-01 DIAGNOSIS — E785 Hyperlipidemia, unspecified: Secondary | ICD-10-CM

## 2013-12-01 DIAGNOSIS — I10 Essential (primary) hypertension: Secondary | ICD-10-CM

## 2013-12-01 DIAGNOSIS — F17200 Nicotine dependence, unspecified, uncomplicated: Secondary | ICD-10-CM

## 2013-12-01 DIAGNOSIS — Z0000 Encounter for general adult medical examination without abnormal findings: Secondary | ICD-10-CM

## 2013-12-01 DIAGNOSIS — Z720 Tobacco use: Secondary | ICD-10-CM

## 2013-12-01 NOTE — Patient Instructions (Addendum)
Call [phone] Quit now [facility] with [facility] to see if [facility] have a smoking program Keep going to silver sneakers Don't walk naked!

## 2013-12-01 NOTE — Progress Notes (Signed)
   Subjective:    Patient ID: [name], female    DOB: Apr 09, 1946, 68 y.o.   MRN: [id number]  HPI She is here for a complete examination. Her main concern today is smoking. She smokes approximately 1/2 pack per day. She has tried multiple methods in the past including abdominal, patches, E. cigarettes, cold [place] none of which has been successful. She has a history of allergies that are under good control. She does have a history of claudication and has had a procedure done. She is followed yearly for this. She also sees her oncologist on a regular basis for followup on her breast cancer. She's been seen with her. She continues on other medications listed in the chart. She does go to silver sneakers and does complain of slight unsteadiness. She is not walk regularly. She admits to having a poor diet Social and family history were reviewed. Immunizations were reviewed. She does not want any shots.  Review of Systems Negative except as above    Objective:   Physical Exam BP 110/74  Pulse 85  Ht 5\' 3"  (1.6 m)  Wt 156 lb (70.761 kg)  BMI 27.64 kg/m2  SpO2 98%  General Appearance:    Alert, cooperative, no distress, appears stated age  Head:    Normocephalic, without obvious abnormality, atraumatic  Eyes:    PERRL, conjunctiva/corneas clear, EOM's intact, fundi    benign  Ears:    Normal TM's and  deferredexternal ear canals  Nose:   Nares normal, mucosa normal, no drainage or sinus   tenderness  Throat:   Lips, mucosa, and tongue normal; teeth and gums normal  Neck:   Supple, no lymphadenopathy;  thyroid:  no   enlargement/tenderness/nodules; no carotid   bruit or JVD  Back:    Spine nontender, no curvature, ROM normal, no CVA     tenderness  Lungs:     Clear to auscultation bilaterally without wheezes, rales or     ronchi; respirations unlabored  Chest Wall:    No tenderness or deformity   Heart:    Regular rate and rhythm, S1 and S2 normal, no murmur, rub   or gallop  Breast  Exam:   deferred  Abdomen:     Soft, non-tender, nondistended, normoactive bowel sounds,    no masses, no hepatosplenomegaly  Genitalia:    Deferred to GYN     Extremities:   No clubbing, cyanosis or edema  Pulses:   2+ and symmetric all extremities  Skin:   Skin color, texture, turgor normal, no rashes or lesions  Lymph nodes:   Cervical, supraclavicular, and axillary nodes normal  Neurologic:   CNII-XII intact, normal strength, sensation and gait; reflexes 2+ and symmetric throughout          Psych:   Normal mood, affect, hygiene and grooming.          Assessment & Plan:  Routine general medical examination at a health care facility  Allergic rhinitis due to pollen  Atherosclerosis of native arteries of the extremities with intermittent claudication  PVD (peripheral vascular disease)  History of breast cancer  Hypertension  Tobacco abuse  Hyperlipidemia LDL goal < 100  she will continue on her present medications. Information concerning smoking cessation and the fact that she is truly not addicted to nicotine was given. Encouraged her to continue going to silver sneakers. Followup as needed.

## 2014-02-07 ENCOUNTER — Telehealth: Payer: Self-pay | Admitting: Hematology and Oncology

## 2014-02-07 NOTE — Telephone Encounter (Signed)
cld pt to adv of the appt for 6/17 @ 10:00 for labs and Dr appt @10 :30/pt understood

## 2014-04-23 ENCOUNTER — Other Ambulatory Visit: Payer: Self-pay | Admitting: Family Medicine

## 2014-04-24 ENCOUNTER — Telehealth: Payer: Self-pay | Admitting: Hematology and Oncology

## 2014-04-24 NOTE — Telephone Encounter (Signed)
pt called to r/s appt...done....pt ok and aware of new d.t °

## 2014-05-02 ENCOUNTER — Ambulatory Visit: Payer: Medicare Other | Admitting: Oncology

## 2014-05-02 ENCOUNTER — Other Ambulatory Visit: Payer: Medicare Other

## 2014-05-03 ENCOUNTER — Ambulatory Visit: Payer: Medicare Other | Admitting: Hematology and Oncology

## 2014-05-03 ENCOUNTER — Other Ambulatory Visit: Payer: Medicare Other

## 2014-05-12 ENCOUNTER — Ambulatory Visit: Payer: Medicare Other | Admitting: Hematology and Oncology

## 2014-05-12 ENCOUNTER — Telehealth: Payer: Self-pay | Admitting: Hematology and Oncology

## 2014-05-12 ENCOUNTER — Other Ambulatory Visit: Payer: Medicare Other

## 2014-05-12 ENCOUNTER — Other Ambulatory Visit: Payer: Self-pay | Admitting: Hematology and Oncology

## 2014-05-12 NOTE — Telephone Encounter (Signed)
s.w. pt and advised that MD cx lab...pt concern as to why not.Marland KitchenMarland Kitchenforwarded to desk nurse

## 2014-05-12 NOTE — Telephone Encounter (Signed)
pt called to cx appt sister has stroke...will call back to r/s

## 2014-07-19 ENCOUNTER — Other Ambulatory Visit: Payer: Self-pay | Admitting: Cardiovascular Disease

## 2014-07-19 ENCOUNTER — Encounter (HOSPITAL_COMMUNITY): Payer: Self-pay | Admitting: Family Medicine

## 2014-07-19 ENCOUNTER — Emergency Department (INDEPENDENT_AMBULATORY_CARE_PROVIDER_SITE_OTHER)
Admission: EM | Admit: 2014-07-19 | Discharge: 2014-07-19 | Disposition: A | Discharge status: Home / Self Care | Payer: Medicare HMO | Source: Home / Self Care | Attending: Family Medicine | Admitting: Family Medicine

## 2014-07-19 DIAGNOSIS — H01003 Unspecified blepharitis right eye, unspecified eyelid: Secondary | ICD-10-CM

## 2014-07-19 DIAGNOSIS — H01009 Unspecified blepharitis unspecified eye, unspecified eyelid: Secondary | ICD-10-CM

## 2014-07-19 MED ORDER — AZITHROMYCIN 250 MG PO TABS: 250.0000 mg | ORAL_TABLET | Freq: Every day | ORAL | Status: DC

## 2014-07-19 NOTE — ED Notes (Signed)
Rx has been e-Rx to [facility] on [address].

## 2014-07-19 NOTE — Discharge Instructions (Signed)
You have an infection of the skin surrounding the eyelid.  Please start the antibiotics Please come back if you are not better.    Blepharitis Blepharitis is redness, soreness, and swelling (inflammation) of one or both eyelids. It may be caused by an allergic reaction or a bacterial infection. Blepharitis may also be associated with reddened, scaly skin (seborrhea) of the scalp and eyebrows. While you sleep, eye discharge may cause your eyelashes to stick together. Your eyelids may itch, burn, swell, and may lose their lashes. These will grow back. Your eyes may become sensitive. Blepharitis may recur and need repeated treatment. If this is the case, you may require further evaluation by an eye specialist (ophthalmologist). HOME CARE INSTRUCTIONS   Keep your hands clean.  Use a clean towel each time you dry your eyelids. Do not use this towel to clean other areas. Do not share a towel or makeup with anyone.  Wash your eyelids with warm water or warm water mixed with a small amount of baby shampoo. Do this twice a day or as often as needed.  Wash your face and eyebrows at least once a day.  Use warm compresses 2 times a day for 10 minutes at a time, or as directed by your caregiver.  Apply antibiotic ointment as directed by your caregiver.  Avoid rubbing your eyes.  Avoid wearing makeup until you get better.  Follow up with your caregiver as directed. SEEK IMMEDIATE MEDICAL CARE IF:   You have pain, redness, or swelling that gets worse or spreads to other parts of your face.  Your vision changes, or you have pain when looking at lights or moving objects.  You have a fever.  Your symptoms continue for longer than 2 to 4 days or become worse. MAKE SURE YOU:   Understand these instructions.  Will watch your condition.  Will get help right away if you are not doing well or get worse. Document Released: 10/31/2000 Document Revised: 01/26/2012 Document Reviewed:  12/11/2010 [facility] Patient Information 2015 [place], [place]. This information is not intended to replace advice given to you by your health care provider. Make sure you discuss any questions you have with your health care provider.

## 2014-07-19 NOTE — ED Notes (Signed)
C/o right eye irritation onset 2 weeks Sx include: swelling, tenderness, watery eyes Denies blurry vision; wears glasses Alert, no signs of acute distress.

## 2014-07-19 NOTE — ED Provider Notes (Signed)
CSN: [id number]     Arrival date & time 07/19/14  0855 History   First MD Initiated Contact with Patient 07/19/14 [phone]     Chief Complaint  Patient presents with  . Eye Problem   (Consider location/radiation/quality/duration/timing/severity/associated sxs/prior Treatment) HPI  R eye irritation: started 2 wks ago w/ eyelid swelling for a couple of days then resolved. Returned on Monday. gettign worse. No change in vision. Associated w/ R eye pain that is intermittent. mucusy discharge. Occasional warm compresses w/ some relief. Denies fevers, nausea, dizziness.    Past Medical History  Diagnosis Date  . Hypertension   . Peripheral vascular disease   . Hyperlipidemia   . Seasonal allergies   . Abdominal wall hernia   . Blood transfusion     30+ yrs. ago  . Cancer     Right Breast   Past Surgical History  Procedure Laterality Date  . Abdominal hysterectomy    . Vascular surgery  05/15/11    aobifemoral bypass graft  . Breast surgery      right lumpectomy  . Breast surgery      multiple breast surgeries  . Aortobifemoral bpg  05/15/11    Using a 14 x8 bifurcated Dacron Graft  . Dilation and curettage of uterus      3-4 in late 20"s  . Ventral hernia repair  12/26/2011    Procedure: LAPAROSCOPIC VENTRAL HERNIA;  Surgeon: Adin Hector, MD;  Location: [facility];  Service: General;  Laterality: N/A;  With Mesh  . Colonoscopy  2012    [facility]   Family History  Problem Relation Age of Onset  . Heart attack Sister   . Heart attack Brother   . Stroke Sister   . Cancer Mother     BREAST, BRAIN  . Heart disease Mother    History  Substance Use Topics  . Smoking status: Current Every Day Smoker -- 0.50 packs/day for 30 years    Types: Cigarettes  . Smokeless tobacco: Never Used     Comment: pt states that she uses the smokeless cig and is trying to quit  . Alcohol Use: No     Comment: occasional alcohol intake   OB History   Grav Para Term Preterm Abortions TAB SAB Ect Mult  Living                 Review of Systems Per HPI with all other pertinent systems negative.   Allergies  Latex  Home Medications   Prior to Admission medications   Medication Sig Start Date End Date Taking? Authorizing Provider  aspirin EC 81 MG tablet Take 81 mg by mouth daily before breakfast.   Yes Historical Provider, MD  atorvastatin (LIPITOR) 20 MG tablet take 1 tablet by mouth once daily 04/24/14  Yes Denita Lung, MD  clopidogrel (PLAVIX) 75 MG tablet Take 1 tablet (75 mg total) by mouth daily. 06/01/13  Yes Lorretta Harp, MD  hydrochlorothiazide (HYDRODIURIL) 25 MG tablet Take 25 mg by mouth daily. PT TAKES 1/2 TABLET DAILY 10/13/12  Yes Denita Lung, MD  ketotifen (ZADITOR) 0.025 % ophthalmic solution Place 1 drop into both eyes daily as needed. For dry/itchy allergy eyes.   Yes Historical Provider, MD  triamcinolone cream (KENALOG) 0.1 % Apply 1 application topically as needed. For rash.   Yes Historical Provider, MD  acetaminophen (TYLENOL) 325 MG tablet Take 650 mg by mouth every 6 (six) hours as needed for pain.    Historical  Provider, MD  azithromycin (ZITHROMAX) 250 MG tablet Take 1 tablet (250 mg total) by mouth daily. Take first 2 tablets together, then 1 every day until finished. 07/19/14   Waldemar Dickens, MD  hydrochlorothiazide (HYDRODIURIL) 25 MG tablet take 1 tablet by mouth once daily 10/26/13   Denita Lung, MD   BP 130/79  Pulse 75  Temp(Src) 98.1 F (36.7 C) (Oral)  Resp 18  SpO2 97% Physical Exam  Constitutional: She is oriented to person, place, and time. She appears well-developed and well-nourished.  HENT:  Soft tissue swelling and ttp w/ erythema around the R eye including the upper eyelid and lateral nasal bridge adn epicanthal folds.   Eyes: EOM are normal. Pupils are equal, round, and reactive to light.  Neck: Normal range of motion.  Cardiovascular: Normal rate, normal heart sounds and intact distal pulses.   Pulmonary/Chest: Effort normal  and breath sounds normal.  Musculoskeletal: Normal range of motion. She exhibits no tenderness.  Neurological: She is alert and oriented to person, place, and time.  Skin: Skin is warm and dry. No rash noted. No erythema. No pallor.  Psychiatric: She has a normal mood and affect. Her behavior is normal. Judgment and thought content normal.    ED Course  Procedures (including critical care time) Labs Review Labs Reviewed - No data to display  Imaging Review No results found.   MDM   1. Blepharitis, right    Start Azithro NSAIDs  Warm compresses Precautions given and all questions answered   Linna Darner, MD Family Medicine 07/19/2014, 9:41 AM      Waldemar Dickens, MD 07/19/14 [phone]

## 2014-08-13 ENCOUNTER — Other Ambulatory Visit: Payer: Self-pay | Admitting: Family Medicine

## 2014-08-16 ENCOUNTER — Telehealth: Payer: Self-pay | Admitting: Family Medicine

## 2014-08-16 NOTE — Telephone Encounter (Signed)
She was seen in January and I gave her enough to cover her until she can be rechecked in tell her I can also take care of the Plavix.

## 2014-08-16 NOTE — Telephone Encounter (Signed)
PT INFORMED WORD FOR WORD SHE VERBALIZED UNDERSTANDING

## 2014-08-23 ENCOUNTER — Encounter: Payer: Self-pay | Admitting: Family Medicine

## 2014-08-23 ENCOUNTER — Other Ambulatory Visit (HOSPITAL_COMMUNITY): Payer: Self-pay | Admitting: Family Medicine

## 2014-08-23 ENCOUNTER — Ambulatory Visit (INDEPENDENT_AMBULATORY_CARE_PROVIDER_SITE_OTHER): Payer: Medicare HMO | Admitting: Family Medicine

## 2014-08-23 VITALS — BP 120/70 | HR 90 | Ht 62.0 in | Wt 154.0 lb

## 2014-08-23 DIAGNOSIS — Z720 Tobacco use: Secondary | ICD-10-CM

## 2014-08-23 DIAGNOSIS — I6523 Occlusion and stenosis of bilateral carotid arteries: Secondary | ICD-10-CM

## 2014-08-23 DIAGNOSIS — G458 Other transient cerebral ischemic attacks and related syndromes: Secondary | ICD-10-CM

## 2014-08-23 DIAGNOSIS — Z853 Personal history of malignant neoplasm of breast: Secondary | ICD-10-CM

## 2014-08-23 DIAGNOSIS — I10 Essential (primary) hypertension: Secondary | ICD-10-CM

## 2014-08-23 LAB — CBC WITH DIFFERENTIAL/PLATELET
Basophils Absolute: 0 10*3/uL (ref 0.0–0.1)
Basophils Relative: 0 % (ref 0–1)
EOS ABS: 0.1 10*3/uL (ref 0.0–0.7)
EOS PCT: 2 % (ref 0–5)
HCT: 36.7 % (ref 36.0–46.0)
Hemoglobin: 12.4 g/dL (ref 12.0–15.0)
LYMPHS ABS: 3.2 10*3/uL (ref 0.7–4.0)
Lymphocytes Relative: 54 % — ABNORMAL HIGH (ref 12–46)
MCH: 28.4 pg (ref 26.0–34.0)
MCHC: 33.8 g/dL (ref 30.0–36.0)
MCV: 84 fL (ref 78.0–100.0)
Monocytes Absolute: 0.4 10*3/uL (ref 0.1–1.0)
Monocytes Relative: 7 % (ref 3–12)
Neutro Abs: 2.2 10*3/uL (ref 1.7–7.7)
Neutrophils Relative %: 37 % — ABNORMAL LOW (ref 43–77)
PLATELETS: 237 10*3/uL (ref 150–400)
RBC: 4.37 MIL/uL (ref 3.87–5.11)
RDW: 15.5 % (ref 11.5–15.5)
WBC: 5.9 10*3/uL (ref 4.0–10.5)

## 2014-08-23 MED ORDER — HYDROCHLOROTHIAZIDE 25 MG PO TABS: ORAL_TABLET | ORAL | Status: DC

## 2014-08-23 MED ORDER — ATORVASTATIN CALCIUM 20 MG PO TABS: ORAL_TABLET | ORAL | Status: DC

## 2014-08-23 MED ORDER — CLOPIDOGREL BISULFATE 75 MG PO TABS: 75.0000 mg | ORAL_TABLET | Freq: Once | ORAL | Status: DC

## 2014-08-23 NOTE — Progress Notes (Signed)
   Subjective:    Patient ID: [name], female    DOB: 09/13/1946, 68 y.o.   MRN: [id number]  HPI She is here for an interval evaluation. She has no particular concerns or complaints. She continues on medications listed in the chart. She's had no chest pain, shortness of breath, leg pain or weakness. She does have a previous history of breast cancer. She also has ultrasound evidence of stenosis but again his had no neurologic symptoms. She continues to smoke approximately half a pack per day and enjoys smoking. She has tried on occasion to stop usually does not last more than several days.   Review of Systems     Objective:   Physical Exam alert and in no distress. Tympanic membranes and canals are normal. Throat is clear. Tonsils are normal. Neck is supple without adenopathy or thyromegaly. Cardiac exam shows a regular sinus rhythm without murmurs or gallops. Lungs are clear to auscultation.        Assessment & Plan:  Tobacco abuse  Other specified transient cerebral ischemias - Plan: atorvastatin (LIPITOR) 20 MG tablet, clopidogrel (PLAVIX) 75 MG tablet, CBC with Differential, Comprehensive metabolic panel, Lipid panel  History of breast cancer - Plan: CBC with Differential, Comprehensive metabolic panel  Essential hypertension - Plan: hydrochlorothiazide (HYDRODIURIL) 25 MG tablet, CBC with Differential, Comprehensive metabolic panel  Carotid stenosis, asymptomatic, bilateral - Plan: US Carotid Duplex Bilateral, atorvastatin (LIPITOR) 20 MG tablet, Lipid panel  I again counseled her on smoking cessation. I will renew her medications and check  routine blood screening. Recommended getting her immunizations updated however she has refused all immunizations.

## 2014-08-24 ENCOUNTER — Ambulatory Visit (HOSPITAL_COMMUNITY)
Admission: RE | Admit: 2014-08-24 | Discharge: 2014-08-24 | Disposition: A | Discharge status: Home / Self Care | Payer: Medicare HMO | Source: Ambulatory Visit | Attending: Vascular Surgery | Admitting: Vascular Surgery

## 2014-08-24 DIAGNOSIS — I6529 Occlusion and stenosis of unspecified carotid artery: Secondary | ICD-10-CM

## 2014-08-24 DIAGNOSIS — I6523 Occlusion and stenosis of bilateral carotid arteries: Secondary | ICD-10-CM | POA: Diagnosis not present

## 2014-08-24 LAB — COMPREHENSIVE METABOLIC PANEL
ALT: 15 U/L (ref 0–35)
AST: 20 U/L (ref 0–37)
Albumin: 3.9 g/dL (ref 3.5–5.2)
Alkaline Phosphatase: 120 U/L — ABNORMAL HIGH (ref 39–117)
BILIRUBIN TOTAL: 0.5 mg/dL (ref 0.2–1.2)
BUN: 11 mg/dL (ref 6–23)
CO2: 29 meq/L (ref 19–32)
CREATININE: 0.74 mg/dL (ref 0.50–1.10)
Calcium: 9.4 mg/dL (ref 8.4–10.5)
Chloride: 102 mEq/L (ref 96–112)
GLUCOSE: 79 mg/dL (ref 70–99)
Potassium: 4.1 mEq/L (ref 3.5–5.3)
Sodium: 141 mEq/L (ref 135–145)
Total Protein: 7.4 g/dL (ref 6.0–8.3)

## 2014-08-24 LAB — LIPID PANEL
CHOL/HDL RATIO: 2.5 ratio
CHOLESTEROL: 143 mg/dL (ref 0–200)
HDL: 57 mg/dL (ref >39)
LDL Cholesterol: 72 mg/dL (ref 0–99)
TRIGLYCERIDES: 71 mg/dL (ref <150)
VLDL: 14 mg/dL (ref 0–40)

## 2014-08-24 NOTE — Progress Notes (Signed)
VASCULAR LAB PRELIMINARY  PRELIMINARY  PRELIMINARY  PRELIMINARY  Carotid duplex  completed.    Preliminary report:  Bilateral:  1-39% ICA stenosis.  Vertebral artery flow is antegrade.      Dianna Deshler, RVT 08/24/2014, 10:39 AM

## 2014-09-05 ENCOUNTER — Encounter: Payer: Self-pay | Admitting: Family Medicine

## 2014-09-05 ENCOUNTER — Ambulatory Visit (INDEPENDENT_AMBULATORY_CARE_PROVIDER_SITE_OTHER): Payer: Medicare HMO | Admitting: Family Medicine

## 2014-09-05 VITALS — BP 124/80 | HR 64 | Wt 155.0 lb

## 2014-09-05 DIAGNOSIS — G479 Sleep disorder, unspecified: Secondary | ICD-10-CM

## 2014-09-05 DIAGNOSIS — F419 Anxiety disorder, unspecified: Secondary | ICD-10-CM

## 2014-09-05 DIAGNOSIS — L500 Allergic urticaria: Secondary | ICD-10-CM

## 2014-09-05 MED ORDER — ALPRAZOLAM 0.25 MG PO TABS: 0.2500 mg | ORAL_TABLET | Freq: Two times a day (BID) | ORAL | Status: DC | PRN

## 2014-09-05 NOTE — Progress Notes (Signed)
   Subjective:    Patient ID: [name], female    DOB: 04-30-1946, 68 y.o.   MRN: [id number]  HPI She is here for consult concerning difficulty with a rash. She states that she gets a bad case of this 2 or 3 times per year but has lesser bouts throughout the year. She cannot relate this to anything in particular but did mention the possibility of stress. Apparently she is the one in her family that people bring into trouble to them she tends to hold onto their stress. She also states this interferes with her sleep.   Review of Systems     Objective:   Physical Exam Alert and in no distress. Slight swelling is noted over her chin. She also has some slight erythema in her anterior chest area. Arms torso and legs show no evidence of a rash.       Assessment & Plan:  Allergic urticaria - Plan: Ambulatory referral to Allergy  Anxiety - Plan: ALPRAZolam (XANAX) 0.25 MG tablet  Sleep disturbance  etiology of her urticaria is unclear. I will refer her to allergy to have this further evaluated. I did discuss the stress that she is under. Recommended counseling although she adamantly refused to do this. She gave several reasons as to why she couldn't do this. I will give her a small amount of Xanax and see how she does with this. Explained that it would not be appropriate for me to continue her on this forever.

## 2014-09-05 NOTE — Patient Instructions (Signed)
Take a Benadryl and Zantac to help with your itching.

## 2014-11-03 ENCOUNTER — Encounter: Payer: Self-pay | Admitting: Internal Medicine

## 2014-11-07 ENCOUNTER — Telehealth: Payer: Self-pay | Admitting: Family Medicine

## 2014-11-07 NOTE — Telephone Encounter (Signed)
Pt called to let us know that she had a flu shot on 11/02/14 at [facility] on [address]

## 2014-11-07 NOTE — Telephone Encounter (Signed)
It has already been documented into computer

## 2014-12-11 ENCOUNTER — Encounter: Payer: Self-pay | Admitting: Family Medicine

## 2014-12-11 ENCOUNTER — Ambulatory Visit (INDEPENDENT_AMBULATORY_CARE_PROVIDER_SITE_OTHER): Payer: Medicare HMO | Admitting: Family Medicine

## 2014-12-11 VITALS — BP 120/74 | HR 84 | Temp 98.4°F | Wt 156.0 lb

## 2014-12-11 DIAGNOSIS — J209 Acute bronchitis, unspecified: Secondary | ICD-10-CM

## 2014-12-11 DIAGNOSIS — Z720 Tobacco use: Secondary | ICD-10-CM

## 2014-12-11 MED ORDER — AZITHROMYCIN 500 MG PO TABS: 500.0000 mg | ORAL_TABLET | Freq: Every day | ORAL | Status: DC

## 2014-12-11 NOTE — Progress Notes (Signed)
   Subjective:    Patient ID: [name], female    DOB: 1946-01-27, 69 y.o.   MRN: [id number]  HPI She complains of an 8 day history this started with a slight sore throat followed by dry cough, nasal and chest congestion, rhinorrhea and PND. Several days into this she developed a productive cough. No earache, fever, chills She has been trying OTC medications. She continues to smoke and at this point is not ready to quit.   Review of Systems     Objective:   Physical Exam alert and in no distress. Tympanic membranes and canals are normal. Throat is clear. Tonsils are normal. Neck is supple without adenopathy or thyromegaly. Cardiac exam shows a regular sinus rhythm without murmurs or gallops. Lungs are clear to auscultation.        Assessment & Plan:  Acute bronchitis, unspecified organism - Plan: azithromycin (ZITHROMAX) 500 MG tablet  Tobacco abuse  recommend NyQuil at night. She will call if no improvement.

## 2014-12-11 NOTE — Patient Instructions (Signed)
Use NyQuil for the cough at night

## 2015-02-21 ENCOUNTER — Telehealth: Payer: Self-pay | Admitting: Hematology and Oncology

## 2015-02-21 NOTE — Telephone Encounter (Signed)
Patient called in to reschedule her missed appointments

## 2015-03-27 ENCOUNTER — Telehealth: Payer: Self-pay | Admitting: Hematology and Oncology

## 2015-03-27 ENCOUNTER — Ambulatory Visit: Payer: Medicare HMO | Admitting: Hematology and Oncology

## 2015-03-27 NOTE — Telephone Encounter (Signed)
returned call and no answer....she was asking to cx appt due to diarrea...done °

## 2015-05-25 ENCOUNTER — Telehealth: Payer: Self-pay | Admitting: Internal Medicine

## 2015-05-25 NOTE — Telephone Encounter (Signed)
This has been done.

## 2015-05-25 NOTE — Telephone Encounter (Signed)
Pt states she needs a referral to [facility] care to see Dr. Collene Gobble. Her insurance is [employer]

## 2015-06-13 ENCOUNTER — Encounter: Payer: Self-pay | Admitting: *Deleted

## 2015-06-20 DIAGNOSIS — H31001 Unspecified chorioretinal scars, right eye: Secondary | ICD-10-CM | POA: Diagnosis not present

## 2015-06-20 DIAGNOSIS — H43811 Vitreous degeneration, right eye: Secondary | ICD-10-CM | POA: Diagnosis not present

## 2015-06-20 DIAGNOSIS — H35033 Hypertensive retinopathy, bilateral: Secondary | ICD-10-CM | POA: Diagnosis not present

## 2015-06-20 DIAGNOSIS — H1013 Acute atopic conjunctivitis, bilateral: Secondary | ICD-10-CM | POA: Diagnosis not present

## 2015-07-11 ENCOUNTER — Encounter: Payer: Self-pay | Admitting: Cardiovascular Disease

## 2015-08-02 ENCOUNTER — Encounter: Payer: Self-pay | Admitting: *Deleted

## 2015-08-02 ENCOUNTER — Telehealth: Payer: Self-pay | Admitting: Family Medicine

## 2015-08-02 NOTE — Telephone Encounter (Signed)
Review of chart shows pt needs med check plus.  Called pt reached voice mail lmtrc. Needs appt.

## 2015-08-15 ENCOUNTER — Other Ambulatory Visit: Payer: Self-pay | Admitting: Family Medicine

## 2015-08-15 NOTE — Telephone Encounter (Signed)
Is this ok to refill?  

## 2015-08-16 DIAGNOSIS — Z1231 Encounter for screening mammogram for malignant neoplasm of breast: Secondary | ICD-10-CM | POA: Diagnosis not present

## 2015-08-16 LAB — HM MAMMOGRAPHY

## 2015-08-20 ENCOUNTER — Encounter: Payer: Self-pay | Admitting: Family Medicine

## 2015-10-16 ENCOUNTER — Other Ambulatory Visit: Payer: Self-pay | Admitting: Family Medicine

## 2015-11-08 ENCOUNTER — Other Ambulatory Visit: Payer: Self-pay | Admitting: Family Medicine

## 2015-11-08 NOTE — Telephone Encounter (Signed)
plavix ok to refill?

## 2015-11-08 NOTE — Telephone Encounter (Signed)
She needs a med check appointment 

## 2015-11-09 NOTE — Telephone Encounter (Signed)
Med check scheduled for 11/27/15

## 2015-11-27 ENCOUNTER — Encounter: Payer: Medicare HMO | Admitting: Family Medicine

## 2015-11-29 ENCOUNTER — Ambulatory Visit (INDEPENDENT_AMBULATORY_CARE_PROVIDER_SITE_OTHER): Payer: Commercial Managed Care - HMO | Admitting: Family Medicine

## 2015-11-29 ENCOUNTER — Encounter: Payer: Self-pay | Admitting: Family Medicine

## 2015-11-29 VITALS — BP 130/78 | HR 82 | Ht 61.5 in | Wt 148.4 lb

## 2015-11-29 DIAGNOSIS — Z853 Personal history of malignant neoplasm of breast: Secondary | ICD-10-CM

## 2015-11-29 DIAGNOSIS — Z720 Tobacco use: Secondary | ICD-10-CM

## 2015-11-29 DIAGNOSIS — G458 Other transient cerebral ischemic attacks and related syndromes: Secondary | ICD-10-CM

## 2015-11-29 DIAGNOSIS — I6523 Occlusion and stenosis of bilateral carotid arteries: Secondary | ICD-10-CM | POA: Diagnosis not present

## 2015-11-29 DIAGNOSIS — Z13820 Encounter for screening for osteoporosis: Secondary | ICD-10-CM | POA: Diagnosis not present

## 2015-11-29 DIAGNOSIS — I739 Peripheral vascular disease, unspecified: Secondary | ICD-10-CM | POA: Diagnosis not present

## 2015-11-29 DIAGNOSIS — Z1159 Encounter for screening for other viral diseases: Secondary | ICD-10-CM

## 2015-11-29 DIAGNOSIS — E785 Hyperlipidemia, unspecified: Secondary | ICD-10-CM

## 2015-11-29 DIAGNOSIS — I10 Essential (primary) hypertension: Secondary | ICD-10-CM | POA: Diagnosis not present

## 2015-11-29 DIAGNOSIS — I70213 Atherosclerosis of native arteries of extremities with intermittent claudication, bilateral legs: Secondary | ICD-10-CM | POA: Diagnosis not present

## 2015-11-29 DIAGNOSIS — Z23 Encounter for immunization: Secondary | ICD-10-CM | POA: Diagnosis not present

## 2015-11-29 DIAGNOSIS — I6529 Occlusion and stenosis of unspecified carotid artery: Secondary | ICD-10-CM | POA: Diagnosis not present

## 2015-11-29 LAB — CBC WITH DIFFERENTIAL/PLATELET
BASOS ABS: 0 10*3/uL (ref 0.0–0.1)
Basophils Relative: 0 % (ref 0–1)
Eosinophils Absolute: 0.1 10*3/uL (ref 0.0–0.7)
Eosinophils Relative: 2 % (ref 0–5)
HEMATOCRIT: 42.1 % (ref 36.0–46.0)
HEMOGLOBIN: 13.4 g/dL (ref 12.0–15.0)
LYMPHS PCT: 51 % — AB (ref 12–46)
Lymphs Abs: 3.3 10*3/uL (ref 0.7–4.0)
MCH: 28.2 pg (ref 26.0–34.0)
MCHC: 31.8 g/dL (ref 30.0–36.0)
MCV: 88.4 fL (ref 78.0–100.0)
MONO ABS: 0.3 10*3/uL (ref 0.1–1.0)
MPV: 10.8 fL (ref 8.6–12.4)
Monocytes Relative: 4 % (ref 3–12)
NEUTROS ABS: 2.8 10*3/uL (ref 1.7–7.7)
Neutrophils Relative %: 43 % (ref 43–77)
Platelets: 204 10*3/uL (ref 150–400)
RBC: 4.76 MIL/uL (ref 3.87–5.11)
RDW: 15.2 % (ref 11.5–15.5)
WBC: 6.4 10*3/uL (ref 4.0–10.5)

## 2015-11-29 LAB — COMPREHENSIVE METABOLIC PANEL
ALBUMIN: 4 g/dL (ref 3.6–5.1)
ALK PHOS: 114 U/L (ref 33–130)
ALT: 18 U/L (ref 6–29)
AST: 24 U/L (ref 10–35)
BILIRUBIN TOTAL: 0.5 mg/dL (ref 0.2–1.2)
BUN: 14 mg/dL (ref 7–25)
CALCIUM: 9.4 mg/dL (ref 8.6–10.4)
CO2: 27 mmol/L (ref 20–31)
Chloride: 100 mmol/L (ref 98–110)
Creat: 0.92 mg/dL (ref 0.50–0.99)
Glucose, Bld: 75 mg/dL (ref 65–99)
Potassium: 3.8 mmol/L (ref 3.5–5.3)
Sodium: 138 mmol/L (ref 135–146)
Total Protein: 7.6 g/dL (ref 6.1–8.1)

## 2015-11-29 LAB — LIPID PANEL
Cholesterol: 163 mg/dL (ref 125–200)
HDL: 69 mg/dL (ref >=46)
LDL Cholesterol: 79 mg/dL (ref <130)
TRIGLYCERIDES: 75 mg/dL (ref <150)
Total CHOL/HDL Ratio: 2.4 Ratio (ref <=5.0)
VLDL: 15 mg/dL (ref <30)

## 2015-11-29 NOTE — Patient Instructions (Signed)
20 minutes of something physical every day 

## 2015-11-29 NOTE — Progress Notes (Signed)
   Subjective:    Patient ID: URI NARAGON, female    DOB: 10-09-1946, 70 y.o.   MRN: [id number]  HPI She is here for an interval evaluation. She has no particular concerns or complaints. She does smoke and is not interested in quitting. She has a previous history of breast cancer and does see her oncologist yearly. She also has a history of TIA and presently is on Plavix. She continues on Lipitor for her hyperlipidemia. She also has a history of peripheral vascular disease but presently is having no symptoms of intermittent claudication. She does take HCTZ for hypertension. She also has a history of carotid stenosis but has no visual changes, headache, weakness.   Review of Systems     Objective:   Physical Exam Alert and in no distress. Tympanic membranes and canals are normal. Pharyngeal area is normal. Neck is supple without adenopathy or thyromegaly. Cardiac exam shows a regular sinus rhythm without murmurs or gallops. Lungs are clear to auscultation.        Assessment & Plan:  Tobacco abuse  History of breast cancer - Plan: CBC with Differential/Platelet, Comprehensive metabolic panel  Essential hypertension - Plan: CBC with Differential/Platelet, Comprehensive metabolic panel  Carotid stenosis, asymptomatic, bilateral - Plan: CBC with Differential/Platelet, Comprehensive metabolic panel  PVD (peripheral vascular disease) ([place])  Need for prophylactic vaccination against Streptococcus pneumoniae (pneumococcus) - Plan: Pneumococcal conjugate vaccine 13-valent  Need for prophylactic vaccination and inoculation against influenza - Plan: Flu vaccine HIGH DOSE PF (Fluzone High dose)  Need for hepatitis C screening test - Plan: Hepatitis C antibody  Screening for osteoporosis - Plan: DG Bone Density  Atherosclerosis of native artery of both lower extremities with intermittent claudication ([place])  Lipidemia - Plan: Lipid panel  Other specified transient cerebral  ischemias Overall she seems to be doing quite well. She will continue on her present medications. Her immunizations were updated. Apparently she did decide to only get one of the shots.

## 2015-11-30 LAB — HEPATITIS C ANTIBODY: HCV AB: NEGATIVE

## 2015-12-07 ENCOUNTER — Other Ambulatory Visit: Payer: Commercial Managed Care - HMO

## 2015-12-11 DIAGNOSIS — M8588 Other specified disorders of bone density and structure, other site: Secondary | ICD-10-CM | POA: Diagnosis not present

## 2015-12-11 DIAGNOSIS — Z780 Asymptomatic menopausal state: Secondary | ICD-10-CM | POA: Diagnosis not present

## 2015-12-11 LAB — HM DEXA SCAN

## 2015-12-17 ENCOUNTER — Other Ambulatory Visit: Payer: Self-pay | Admitting: Medical

## 2015-12-18 ENCOUNTER — Telehealth: Payer: Self-pay | Admitting: Family Medicine

## 2015-12-18 MED ORDER — ATORVASTATIN CALCIUM 20 MG PO TABS: 20.0000 mg | ORAL_TABLET | Freq: Every day | ORAL | Status: DC

## 2015-12-18 MED ORDER — CLOPIDOGREL BISULFATE 75 MG PO TABS: ORAL_TABLET | ORAL | Status: DC

## 2015-12-18 NOTE — Telephone Encounter (Signed)
Pt called and stated she needed refills on plavix and lipitor. Medication was sent in by [facility] for 30 stating she needs an appt but pt was just seen AND she is a Insurance account manager pt. PLEASE CHANGE Plavix and lipitor to #90. Sending to [place] to change lipitor and [facility] for approval for plavix.

## 2015-12-18 NOTE — Telephone Encounter (Signed)
Spoke with patient to inform that Lipitor & Plavix has been changed to #90

## 2015-12-18 NOTE — Addendum Note (Signed)
Addended by: Minette Headland A on: 12/18/2015 10:48 AM   Modules accepted: Orders

## 2015-12-18 NOTE — Telephone Encounter (Signed)
Called pt and gave Bone Density results. Made sure that she understood that the results show osteopenia and that she should take a multi vitamin and vitamin D daily.

## 2015-12-18 NOTE — Telephone Encounter (Signed)
Let her know that I change days.

## 2015-12-19 ENCOUNTER — Encounter: Payer: Self-pay | Admitting: Internal Medicine

## 2016-05-21 ENCOUNTER — Ambulatory Visit (INDEPENDENT_AMBULATORY_CARE_PROVIDER_SITE_OTHER): Payer: Commercial Managed Care - HMO | Admitting: Family Medicine

## 2016-05-21 ENCOUNTER — Encounter: Payer: Self-pay | Admitting: Family Medicine

## 2016-05-21 VITALS — BP 90/60 | HR 89 | Wt 146.0 lb

## 2016-05-21 DIAGNOSIS — Z720 Tobacco use: Secondary | ICD-10-CM

## 2016-05-21 DIAGNOSIS — L723 Sebaceous cyst: Secondary | ICD-10-CM | POA: Diagnosis not present

## 2016-05-21 DIAGNOSIS — M8580 Other specified disorders of bone density and structure, unspecified site: Secondary | ICD-10-CM

## 2016-05-21 NOTE — Patient Instructions (Addendum)
Call [phone] quit now Look at when, where and why and come up with again plan on what you will do instead of smoking,

## 2016-05-21 NOTE — Progress Notes (Signed)
   Subjective:    Patient ID: [name], female    DOB: 09-Jan-1946, 70 y.o.   MRN: [id number]  HPI She is here for consult concerning recent DEXA scan which did show evidence of osteopenia. She is unsure what that really means. She also has a lesion present on her abdomen that she has concerns about. She continues to smoke and has tried on several occasions to quit smoking but so far has been unsuccessful.   Review of Systems     Objective:   Physical Exam Alert and in no distress. Black lesion is noted on the abdomen that was easily removed       Assessment & Plan:  Tobacco abuse  Osteopenia determined by x-ray  Sebaceous cyst I explained that the lesion on her abdomen was really assisted to come to the surface and was nothing to worry about. It was removed without difficulty. I discussed osteopenia and what it means. Discussed the fact that she needs to stay on a multivitamin with extra vitamin D and calcium. We then discussed tobacco use. Strongly encouraged her to make a list of when, where and why she smokes and then have a list of what she will do in lieu of that. Encouraged her to call me so we can discuss this further at a later date. Over 25 minutes, greater than 50% spent in counseling and coordination of care.

## 2016-11-18 ENCOUNTER — Telehealth: Payer: Self-pay | Admitting: Hematology and Oncology

## 2016-11-18 NOTE — Telephone Encounter (Signed)
Pt called to sch follow up appt  . Gave pt new appt date/time

## 2016-11-27 DIAGNOSIS — R140 Abdominal distension (gaseous): Secondary | ICD-10-CM | POA: Diagnosis not present

## 2016-11-27 DIAGNOSIS — Z1211 Encounter for screening for malignant neoplasm of colon: Secondary | ICD-10-CM | POA: Diagnosis not present

## 2016-11-27 DIAGNOSIS — K8020 Calculus of gallbladder without cholecystitis without obstruction: Secondary | ICD-10-CM | POA: Diagnosis not present

## 2016-11-27 DIAGNOSIS — K5904 Chronic idiopathic constipation: Secondary | ICD-10-CM | POA: Diagnosis not present

## 2016-11-27 DIAGNOSIS — Z86010 Personal history of colonic polyps: Secondary | ICD-10-CM | POA: Diagnosis not present

## 2016-12-03 ENCOUNTER — Telehealth: Payer: Self-pay | Admitting: Hematology and Oncology

## 2016-12-03 NOTE — Telephone Encounter (Signed)
Pt called to cxl 1/18 appt due to not feeling well. Pt will call back to r/s appt

## 2016-12-04 ENCOUNTER — Other Ambulatory Visit: Payer: Medicare HMO

## 2016-12-04 ENCOUNTER — Ambulatory Visit: Payer: Medicare HMO | Admitting: Hematology and Oncology

## 2016-12-05 ENCOUNTER — Telehealth: Payer: Self-pay | Admitting: Family Medicine

## 2016-12-05 NOTE — Telephone Encounter (Signed)
Pt having colonoscopy with Dr Collene Mares on Friday 12/12/16 and pt wants to know if she need to stop blood thinner before the procedure

## 2016-12-05 NOTE — Telephone Encounter (Signed)
I have tried to call twice and both times it would ring and say circuits were busy

## 2016-12-05 NOTE — Telephone Encounter (Signed)
Pt informed and verbalized understanding

## 2016-12-05 NOTE — Telephone Encounter (Signed)
To be safe have her stop it for about 5 days prior to start back on the day of the colonoscopy

## 2016-12-09 ENCOUNTER — Other Ambulatory Visit: Payer: Self-pay | Admitting: Family Medicine

## 2016-12-12 DIAGNOSIS — K635 Polyp of colon: Secondary | ICD-10-CM | POA: Diagnosis not present

## 2016-12-12 DIAGNOSIS — D125 Benign neoplasm of sigmoid colon: Secondary | ICD-10-CM | POA: Diagnosis not present

## 2016-12-12 DIAGNOSIS — Z1211 Encounter for screening for malignant neoplasm of colon: Secondary | ICD-10-CM | POA: Diagnosis not present

## 2016-12-12 DIAGNOSIS — R69 Illness, unspecified: Secondary | ICD-10-CM | POA: Diagnosis not present

## 2016-12-12 DIAGNOSIS — Z86010 Personal history of colonic polyps: Secondary | ICD-10-CM | POA: Diagnosis not present

## 2016-12-16 DIAGNOSIS — H35033 Hypertensive retinopathy, bilateral: Secondary | ICD-10-CM | POA: Diagnosis not present

## 2016-12-16 DIAGNOSIS — H35362 Drusen (degenerative) of macula, left eye: Secondary | ICD-10-CM | POA: Diagnosis not present

## 2016-12-16 DIAGNOSIS — H2513 Age-related nuclear cataract, bilateral: Secondary | ICD-10-CM | POA: Diagnosis not present

## 2016-12-16 DIAGNOSIS — H25013 Cortical age-related cataract, bilateral: Secondary | ICD-10-CM | POA: Diagnosis not present

## 2016-12-23 DIAGNOSIS — Z1231 Encounter for screening mammogram for malignant neoplasm of breast: Secondary | ICD-10-CM | POA: Diagnosis not present

## 2016-12-23 DIAGNOSIS — Z853 Personal history of malignant neoplasm of breast: Secondary | ICD-10-CM | POA: Diagnosis not present

## 2016-12-23 LAB — HM MAMMOGRAPHY

## 2016-12-26 ENCOUNTER — Other Ambulatory Visit: Payer: Self-pay | Admitting: Family Medicine

## 2016-12-29 ENCOUNTER — Encounter: Payer: Self-pay | Admitting: Family Medicine

## 2016-12-31 ENCOUNTER — Encounter: Payer: Self-pay | Admitting: Family Medicine

## 2017-01-12 ENCOUNTER — Encounter: Payer: Self-pay | Admitting: Family Medicine

## 2017-01-12 ENCOUNTER — Telehealth: Payer: Self-pay | Admitting: Hematology and Oncology

## 2017-01-12 NOTE — Telephone Encounter (Signed)
Pt called to r/s Jan appts. Gave pt new appt date/time

## 2017-02-04 ENCOUNTER — Other Ambulatory Visit: Payer: Self-pay | Admitting: Family Medicine

## 2017-02-04 NOTE — Telephone Encounter (Signed)
Is this okay  To refill

## 2017-02-10 ENCOUNTER — Other Ambulatory Visit: Payer: Medicare HMO

## 2017-02-10 ENCOUNTER — Telehealth: Payer: Self-pay | Admitting: Hematology and Oncology

## 2017-02-10 ENCOUNTER — Encounter: Payer: Self-pay | Admitting: Hematology and Oncology

## 2017-02-10 ENCOUNTER — Encounter (INDEPENDENT_AMBULATORY_CARE_PROVIDER_SITE_OTHER): Payer: Self-pay

## 2017-02-10 ENCOUNTER — Ambulatory Visit (HOSPITAL_BASED_OUTPATIENT_CLINIC_OR_DEPARTMENT_OTHER): Payer: Medicare HMO | Admitting: Hematology and Oncology

## 2017-02-10 DIAGNOSIS — Z853 Personal history of malignant neoplasm of breast: Secondary | ICD-10-CM | POA: Diagnosis not present

## 2017-02-10 DIAGNOSIS — Z720 Tobacco use: Secondary | ICD-10-CM

## 2017-02-10 DIAGNOSIS — Z122 Encounter for screening for malignant neoplasm of respiratory organs: Secondary | ICD-10-CM

## 2017-02-10 DIAGNOSIS — I739 Peripheral vascular disease, unspecified: Secondary | ICD-10-CM

## 2017-02-10 NOTE — Assessment & Plan Note (Signed)
Examination is benign with no signs of cancer recurrence. Her last mammogram was in 12/23/2016 and it was negative I continue to reinforced the importance of annual screening mammogram I will see her back once a year with history and physical examination only

## 2017-02-10 NOTE — Progress Notes (Signed)
[place] CONSULT NOTE  Patient Care Team: Denita Lung, MD as PCP - General (Family Medicine) Juanita Craver, MD (Gastroenterology) Lorretta Harp, MD (Cardiology) Serafina Mitchell, MD as Consulting Physician (Vascular Surgery) Heath Lark, MD as Consulting Physician (Hematology and Oncology)  CHIEF COMPLAINTS/PURPOSE OF CONSULTATION:  History of right breast cancer  HISTORY OF PRESENTING ILLNESS:  [name] 71 y.o. female is here  to reestablish care She has lost to follow-up since 2014 The patient stated that she has seen me before but I could not find any documentation related to that  She was diagnosed with right breast cancer from screening mammogram. The cancer was not palpable prior to diagnosis I reviewed her records extensively and collaborated the history with the patient. She was postmenopausal at the time of diagnosis According to documentation from prior physician, her original cancer measured 0.7 cm, grade 2, invasive ductal carcinoma, with high-grade DCIS; 2/15 lymph nodes positive; ER positive, PR negative, HER-2/neu negative. She had lumpectomy with axillary dissection in 2002.  She received adjuvant chemotherapy FEC and radiation therapy for adjuvant tamoxifen which was finished in March 2008.  In terms of breast cancer risk profile:  She menarched at early age of 52 and went to menopause at age 89 She had surgical hysterectomy and bilateral salpingo-oophorectomy due to significant bleeding from menorrhagia. She had 1 pregnancy, her first child was born at age 75  She did not breast-fed her child  She did not received birth control pills She was never exposed to fertility medications but took hormone replacement therapy after surgical hysterectomy for approximately 5 years.  She has  family history of Breast cancer as outlined She has never undergone genetic testing before  Since her completion of treatment of breast cancer, she had close  follow-up with annual screening mammogram Her last mammogram was a month ago and it was negative She denies any recent abnormal breast examination, palpable mass, abnormal breast appearance or nipple changes  Of note, she has significant smoking history and peripheral vascular disease and heart disease.  She had history of transient ischemic attack without any permanent neurological deficits.  She has occasional bruising from antiplatelet agents  MEDICAL HISTORY:  Past Medical History:  Diagnosis Date  . Abdominal wall hernia   . Blood transfusion    30+ yrs. ago  . Cancer (HCC)    Right Breast  . Hyperlipidemia   . Hypertension   . Peripheral vascular disease ([place])   . Seasonal allergies   . TIA (transient ischemic attack)     SURGICAL HISTORY: Past Surgical History:  Procedure Laterality Date  . ABDOMINAL HYSTERECTOMY    . Aortobifemoral BPG  05/15/11   Using a 14 x8 bifurcated Dacron Graft  . BREAST SURGERY     right lumpectomy  . BREAST SURGERY     multiple breast surgeries  . COLONOSCOPY  2012   [facility]  . DILATION AND CURETTAGE OF UTERUS     3-4 in late 20"s  . NM MYOCAR PERF WALL MOTION  03/11/2011   mild to moderate perfusion defect in the apical and apical lateral region c/w an infarct/scar  . VASCULAR SURGERY  05/15/11   aobifemoral bypass graft  . VENTRAL HERNIA REPAIR  12/26/2011   Procedure: LAPAROSCOPIC VENTRAL HERNIA;  Surgeon: Adin Hector, MD;  Location: [facility] ORS;  Service: General;  Laterality: N/A;  With Mesh    SOCIAL HISTORY: Social History   Social History  . Marital status:  Married    Spouse name: N/A  . Number of children: N/A  . Years of education: N/A   Occupational History  . Not on file.   Social History Main Topics  . Smoking status: Current Every Day Smoker    Packs/day: 0.50    Years: 30.00    Types: Cigarettes  . Smokeless tobacco: Never Used     Comment: pt states that she uses the smokeless cig and is trying to quit  . Alcohol  use No     Comment: occasional alcohol intake  . Drug use: No  . Sexual activity: Yes   Other Topics Concern  . Not on file   Social History Narrative  . No narrative on file    FAMILY HISTORY: Family History  Problem Relation Age of Onset  . Heart attack Sister   . Heart attack Brother   . Stroke Sister   . Cancer Mother     BREAST, BRAIN  . Heart disease Mother   . Cancer Paternal Aunt     breast ca  . Cancer Paternal Grandmother     unknown ca    ALLERGIES:  is allergic to latex.  MEDICATIONS:  Current Outpatient Prescriptions  Medication Sig Dispense Refill  . cholecalciferol (VITAMIN D) 1000 units tablet Take 1,000 Units by mouth daily.    [name] aspirin EC 81 MG tablet Take 81 mg by mouth daily before breakfast.    . atorvastatin (LIPITOR) 20 MG tablet take 1 tablet by mouth once daily 90 tablet 0  . clopidogrel (PLAVIX) 75 MG tablet take 1 tablet by mouth once daily 90 tablet 3  . hydrochlorothiazide (HYDRODIURIL) 25 MG tablet take 1 tablet by mouth once daily 90 tablet 1  . ketotifen (ZADITOR) 0.025 % ophthalmic solution Place 1 drop into both eyes daily as needed. For dry/itchy allergy eyes.    [name] triamcinolone cream (KENALOG) 0.1 % Apply 1 application topically as needed. For rash.     No current facility-administered medications for this visit.     REVIEW OF SYSTEMS:   Constitutional: Denies fevers, chills or abnormal night sweats Eyes: Denies blurriness of vision, double vision or watery eyes Ears, nose, mouth, throat, and face: Denies mucositis or sore throat Respiratory: Denies cough, dyspnea or wheezes Cardiovascular: Denies palpitation, chest discomfort or lower extremity swelling Gastrointestinal:  Denies nausea, heartburn or change in bowel habits Skin: Denies abnormal skin rashes Lymphatics: Denies new lymphadenopathy  Neurological:Denies numbness, tingling or new weaknesses Behavioral/Psych: Mood is stable, no new changes  All other systems were  reviewed with the patient and are negative.  PHYSICAL EXAMINATION: ECOG PERFORMANCE STATUS: 0 - Asymptomatic  Vitals:   02/10/17 0900  BP: 125/62  Pulse: 96  Resp: 17  Temp: 98.6 F (37 C)   Filed Weights   02/10/17 0900  Weight: 144 lb 9.6 oz (65.6 kg)    GENERAL:alert, no distress and comfortable SKIN: skin color, texture, turgor are normal, no rashes or significant lesions EYES: normal, conjunctiva are pink and non-injected, sclera clear OROPHARYNX:no exudate, no erythema and lips, buccal mucosa, and tongue normal  NECK: supple, thyroid normal size, non-tender, without nodularity LYMPH:  no palpable lymphadenopathy in the cervical, axillary or inguinal LUNGS: clear to auscultation and percussion with normal breathing effort HEART: regular rate & rhythm and no murmurs and no lower extremity edema ABDOMEN:abdomen soft, non-tender and normal bowel sounds Musculoskeletal:no cyanosis of digits and no clubbing  PSYCH: alert & oriented x 3 with fluent speech NEURO:  no focal motor/sensory deficits BREAST exam: Well-healed lumpectomy scar on the right breast; No other abnormalities.  Very mild skin changes likely due to radiation therapy The left breast exam is normal LABORATORY DATA:  I have reviewed the data as listed Lab Results  Component Value Date   WBC 6.4 11/29/2015   HGB 13.4 11/29/2015   HCT 42.1 11/29/2015   MCV 88.4 11/29/2015   PLT 204 11/29/2015   No results for input(s): NA, K, CL, CO2, GLUCOSE, BUN, CREATININE, CALCIUM, GFRNONAA, GFRAA, PROT, ALBUMIN, AST, ALT, ALKPHOS, BILITOT, BILIDIR, IBILI in the last 8760 hours. I reviewed her recent mammogram report ASSESSMENT & PLAN:   History of breast cancer Examination is benign with no signs of cancer recurrence. Her last mammogram was in 12/23/2016 and it was negative I continue to reinforced the importance of annual screening mammogram I will see her back once a year with history and physical examination  only  Tobacco abuse The patient have chronic smoking history She had significant history of peripheral vascular disease including history of transient ischemic attack/stroke and heart surgery She appears to be motivated to quit smoking She has declined additional medications for that We discussed lung cancer screening guidelines and she will qualify for low-dose CT lung cancer screening tests and she agreed to proceed  PVD (peripheral vascular disease) ([place]) We reinforced importance of smoking cessation She will continue medical management as directed She has no signs or symptoms to suggest angina or congestive heart failure  Orders Placed This Encounter  Procedures  . CT CHEST LUNG CA SCREEN LOW DOSE W/O CM    Standing Status:   Future    Standing Expiration Date:   04/12/2018    Order Specific Question:   Reason for Exam (SYMPTOM  OR DIAGNOSIS REQUIRED)    Answer:   lifelong smoker almost 50 years, almost 1 pack per day, exclude lung ca, hx breast ca    Order Specific Question:   Preferred Imaging Location?    Answer:   [facility]    All questions were answered. The patient knows to call the clinic with any problems, questions or concerns. I spent 40 minutes counseling the patient face to face. The total time spent in the appointment was 55 minutes and more than 50% was on counseling.     Heath Lark, MD 02/10/2017 9:34 AM

## 2017-02-10 NOTE — Assessment & Plan Note (Signed)
The patient have chronic smoking history She had significant history of peripheral vascular disease including history of transient ischemic attack/stroke and heart surgery She appears to be motivated to quit smoking She has declined additional medications for that We discussed lung cancer screening guidelines and she will qualify for low-dose CT lung cancer screening tests and she agreed to proceed

## 2017-02-10 NOTE — Assessment & Plan Note (Signed)
We reinforced importance of smoking cessation She will continue medical management as directed She has no signs or symptoms to suggest angina or congestive heart failure

## 2017-02-10 NOTE — Telephone Encounter (Signed)
Gave patient AVS and calender per 02/10/2017 los.  

## 2017-02-19 ENCOUNTER — Ambulatory Visit (INDEPENDENT_AMBULATORY_CARE_PROVIDER_SITE_OTHER): Payer: Commercial Managed Care - HMO | Admitting: Family Medicine

## 2017-02-19 ENCOUNTER — Encounter: Payer: Self-pay | Admitting: Family Medicine

## 2017-02-19 VITALS — BP 120/70 | HR 85 | Ht 62.5 in | Wt 145.0 lb

## 2017-02-19 DIAGNOSIS — Z720 Tobacco use: Secondary | ICD-10-CM | POA: Diagnosis not present

## 2017-02-19 DIAGNOSIS — Z853 Personal history of malignant neoplasm of breast: Secondary | ICD-10-CM

## 2017-02-19 DIAGNOSIS — Z8249 Family history of ischemic heart disease and other diseases of the circulatory system: Secondary | ICD-10-CM

## 2017-02-19 DIAGNOSIS — Z23 Encounter for immunization: Secondary | ICD-10-CM

## 2017-02-19 DIAGNOSIS — I6523 Occlusion and stenosis of bilateral carotid arteries: Secondary | ICD-10-CM

## 2017-02-19 DIAGNOSIS — I739 Peripheral vascular disease, unspecified: Secondary | ICD-10-CM

## 2017-02-19 DIAGNOSIS — I70213 Atherosclerosis of native arteries of extremities with intermittent claudication, bilateral legs: Secondary | ICD-10-CM | POA: Diagnosis not present

## 2017-02-19 DIAGNOSIS — I10 Essential (primary) hypertension: Secondary | ICD-10-CM

## 2017-02-19 NOTE — Progress Notes (Signed)
Subjective:     Patient ID: [name], female   DOB: [birth year], 71 y.o.   MRN: [id number]  HPI  Kyrsten Deleeuw is a 71 year old female with a past medical history of breast cancer, peripheral vascular disease, carotid stenosis, allergic rhinitis, hypertension, and tobacco abuse who presents today for a medicare wellness check, follow up of her multiple medical problems as well as concerns regarding her hearing, feet, and a recent visit with Dr. Collene Mares.  The patient reports her family has said she has trouble hearing. This has been going on for awhile, but the patient doesn't feel this is a major issue. The patient does want to see if it could be wax causing the issue. She has no itchy eyes, runny nose, or headaches that could be associated.   The patient also reports a 6 month history of pain and numbness in the bottom of her feet that happens when she lays down at night and goes away after standing up and walking around. It hasn't been as bad for the past few weeks.  The patient also saw Dr. Collene Mares for her colonoscopy in February of 2018. At the visit they did blood work and told the patient she may have a fatty liver and to start a healthy diet for the next 6 months. The patient wasn't sure if this was an issue she needed to follow up on or if there was anything else she should do.  The patient reports no symptoms related to her peripheral vascular disease I.e. pain with walking.. She isn't currently being followed by anyone about the peripheral vascular disease or carotid stenosis.   The patient hashypertension and is on her present medications and she has no concerns.  She hasn't had any symptoms from her allergic rhinitis and has no other complaints or concerns.  She still smokes half a pack per day and has done so for the past 50 years.She is trying to quit and her goal is to quit by her next visit. The patient does not drink alcohol or use other drugs.  The family history was reviewed. Her  mother had breast cancer. There is a history of heart disease in her family with a brother who had a heart attack. Her social history is unchanged. She is not sexually active. She does have stress with her family and often feels this contributes to her smoking habits..  The patient takes her medications as prescribed. She isn't experiencing any symptoms or side effects from her medications and has no questions or concerns. Her diet is normal and unchanged and she doesn't exercise outside of her normal activities. She does garden in the summer.  Reviewed screenings and immunizations. She is up to date on her colonoscopy and mammogram. She has an upcoming lung cancer screening next week.  We also discussed the Tdap and shingrix vaccine and encouraged her to get it at her drug store.  Review of Systems The review of systems is included in the subjective above. The patient reports no additional symptoms.    Objective:   Physical Exam Alert and in no distress. Ears were clear and had no cerumen impaction bilaterally. Tympanic membranes were normal bilaterally. Neck had no lymphadenopathy or swelling. Heart was regular rate and rhythm without murmurs, rubs, or gallops. Lungs were clear to auscultation bilaterally. Foot exam showed no wounds, scrapes, or bruises. The left dorsalis pedis pulse was felt, but the right pulse was difficult to feel. Both feet were well perfused.  Assessment and Plan:     Need for 23-polyvalent pneumococcal polysaccharide vaccine - Plan: Pneumococcal polysaccharide vaccine 23-valent greater than or equal to 2yo subcutaneous/IM  1. HTN: Patient's hypertension is well-controlled on current regimen. We will continue current medications (hydrochlorothiazide) 2. Atherosclerosis, PVD, and carotid stenosis: The patient isn't experiencing any symptoms related to this and will continue on the lipitor, aspirin, and plavix as prescribed. 3. Trouble hearing: Patient is not  concerned about this and there was no evidence of hearing loss or cerumen impaction. If hearing worsens or she experiences more issues we will refer to an audiologist. 4. Pain in feet: Pain decreases after walking and has been occurring less frequently in the past few weeks. We encouraged her to keep an eye on it and let us know if there are any changes. 5. Fatty liver concerns: We will follow up with Dr. Collene Mares and obtain the blood work and results from her visit to see if this is something we need to address in the future.  6. Tobacco Use: We counseled the patient on decreasing smoking and she was receptive to this. She has a goal to stop smoking before her next visit 7. Family Stress: We counseled the patient on family stress and ways she can avoid this so it doesn't impact her as much. We encouraged her to come back if the foot concerns became more of an issue, but to continue to stand up and walk around when it happens.  We'll follow-up on the PVD at a later date The patient was seen in conjunction with Dr. Redmond School. [place] Calix, MS3

## 2017-02-19 NOTE — Patient Instructions (Signed)
Use the Serenity Prayer No one can make you feel guilty unless you first give them permission The next time the phone rings don't answer it

## 2017-02-23 ENCOUNTER — Telehealth: Payer: Self-pay | Admitting: Medical Oncology

## 2017-02-23 NOTE — Telephone Encounter (Signed)
Confirmed appts . 

## 2017-02-24 ENCOUNTER — Ambulatory Visit (HOSPITAL_COMMUNITY)
Admission: RE | Admit: 2017-02-24 | Discharge: 2017-02-24 | Disposition: A | Discharge status: Home / Self Care | Payer: Medicare HMO | Source: Ambulatory Visit | Attending: Hematology and Oncology | Admitting: Hematology and Oncology

## 2017-02-24 ENCOUNTER — Telehealth: Payer: Self-pay

## 2017-02-24 ENCOUNTER — Encounter (HOSPITAL_COMMUNITY): Payer: Self-pay

## 2017-02-24 DIAGNOSIS — I700 Atherosclerosis of aorta: Secondary | ICD-10-CM | POA: Diagnosis not present

## 2017-02-24 DIAGNOSIS — I2510 Atherosclerotic heart disease of native coronary artery without angina pectoris: Secondary | ICD-10-CM | POA: Diagnosis not present

## 2017-02-24 DIAGNOSIS — Z122 Encounter for screening for malignant neoplasm of respiratory organs: Secondary | ICD-10-CM | POA: Insufficient documentation

## 2017-02-24 DIAGNOSIS — J439 Emphysema, unspecified: Secondary | ICD-10-CM | POA: Insufficient documentation

## 2017-02-24 DIAGNOSIS — F17210 Nicotine dependence, cigarettes, uncomplicated: Secondary | ICD-10-CM | POA: Diagnosis not present

## 2017-02-24 NOTE — Telephone Encounter (Signed)
-----   Message from Heath Lark, MD sent at 02/24/2017 12:03 PM EDT ----- Regarding: test result Ct chest is negative If it is OK with her, we can cancel her appt tomorrow and see her in 1 year Let me know and I can place scheduling msg ----- Message ----- From: Interface, Rad Results In Sent: 02/24/2017  12:01 PM To: Heath Lark, MD

## 2017-02-24 NOTE — Telephone Encounter (Signed)
Called with below message. Patient is glad to cancel appt for tomorrow and see Dr. Alvy Bimler is 1 year.

## 2017-02-25 ENCOUNTER — Ambulatory Visit: Payer: Medicare HMO | Admitting: Hematology and Oncology

## 2017-04-22 ENCOUNTER — Other Ambulatory Visit: Payer: Self-pay | Admitting: Family Medicine

## 2017-04-22 NOTE — Telephone Encounter (Signed)
Pt hasn't had her lipids checked since 11/2015 in system. Pt had blood work 11/2016 but no lipids done. Is this okay to refill

## 2017-04-23 ENCOUNTER — Other Ambulatory Visit: Payer: Self-pay

## 2017-04-23 DIAGNOSIS — I6523 Occlusion and stenosis of bilateral carotid arteries: Secondary | ICD-10-CM

## 2017-04-23 DIAGNOSIS — I70213 Atherosclerosis of native arteries of extremities with intermittent claudication, bilateral legs: Secondary | ICD-10-CM

## 2017-04-23 NOTE — Telephone Encounter (Signed)
Pt is coming in the 12 th to have lipid draw and I have sent in meds

## 2017-04-23 NOTE — Telephone Encounter (Signed)
It looks as if she hasn't had lipids done in over a year. Have her come in for the lab work. The ahead and give her a 90 day supply

## 2017-04-28 ENCOUNTER — Other Ambulatory Visit: Payer: Commercial Managed Care - HMO

## 2017-04-28 DIAGNOSIS — I6523 Occlusion and stenosis of bilateral carotid arteries: Secondary | ICD-10-CM

## 2017-04-28 DIAGNOSIS — I70213 Atherosclerosis of native arteries of extremities with intermittent claudication, bilateral legs: Secondary | ICD-10-CM | POA: Diagnosis not present

## 2017-04-28 LAB — LIPID PANEL
Cholesterol: 164 mg/dL (ref <200)
HDL: 67 mg/dL (ref >50)
LDL Cholesterol: 82 mg/dL (ref <100)
TRIGLYCERIDES: 73 mg/dL (ref <150)
Total CHOL/HDL Ratio: 2.4 Ratio (ref <5.0)
VLDL: 15 mg/dL (ref <30)

## 2017-06-20 ENCOUNTER — Other Ambulatory Visit: Payer: Self-pay | Admitting: Family Medicine

## 2017-08-22 ENCOUNTER — Other Ambulatory Visit: Payer: Self-pay | Admitting: Family Medicine

## 2017-09-02 ENCOUNTER — Other Ambulatory Visit: Payer: Self-pay | Admitting: Family Medicine

## 2018-01-05 ENCOUNTER — Other Ambulatory Visit: Payer: Self-pay | Admitting: Family Medicine

## 2018-01-13 ENCOUNTER — Encounter: Payer: Self-pay | Admitting: Family Medicine

## 2018-01-18 ENCOUNTER — Ambulatory Visit (INDEPENDENT_AMBULATORY_CARE_PROVIDER_SITE_OTHER): Payer: Medicare HMO | Admitting: Family Medicine

## 2018-01-18 ENCOUNTER — Encounter: Payer: Self-pay | Admitting: Family Medicine

## 2018-01-18 VITALS — BP 112/68 | HR 76 | Wt 136.4 lb

## 2018-01-18 DIAGNOSIS — I739 Peripheral vascular disease, unspecified: Secondary | ICD-10-CM

## 2018-01-18 DIAGNOSIS — Z23 Encounter for immunization: Secondary | ICD-10-CM

## 2018-01-18 DIAGNOSIS — Z720 Tobacco use: Secondary | ICD-10-CM | POA: Diagnosis not present

## 2018-01-18 DIAGNOSIS — I10 Essential (primary) hypertension: Secondary | ICD-10-CM | POA: Diagnosis not present

## 2018-01-18 DIAGNOSIS — I70213 Atherosclerosis of native arteries of extremities with intermittent claudication, bilateral legs: Secondary | ICD-10-CM | POA: Diagnosis not present

## 2018-01-18 DIAGNOSIS — Z853 Personal history of malignant neoplasm of breast: Secondary | ICD-10-CM

## 2018-01-18 DIAGNOSIS — Z8249 Family history of ischemic heart disease and other diseases of the circulatory system: Secondary | ICD-10-CM | POA: Diagnosis not present

## 2018-01-18 MED ORDER — HYDROCHLOROTHIAZIDE 25 MG PO TABS: 25.0000 mg | ORAL_TABLET | Freq: Every day | ORAL | 3 refills | Status: DC

## 2018-01-18 MED ORDER — ATORVASTATIN CALCIUM 20 MG PO TABS: 20.0000 mg | ORAL_TABLET | Freq: Every day | ORAL | 3 refills | Status: DC

## 2018-01-18 NOTE — Progress Notes (Signed)
   Subjective:    Patient ID: [name], female    DOB: Feb 15, 1946, 72 y.o.   MRN: [id number]  HPI She is here for an interval evaluation.  She does have an underlying history of PVD and has had previous surgery.  She does complain of intermittent difficulty with leg cramps but states that she can walk without difficulty.  Review of the record also indicates a slight weight loss but no nausea, vomiting.  She does states she has early satiety but then states this is been going on for several years.  She continues to smoke and is not ready to quit.  She goes yearly for follow-up on her breast cancer.  She continues on Lipitor and has had no aches or pains with that.  She also continues on Plavix as well as HCTZ.     Review of Systems     Objective:   Physical Exam Alert and in no distress. Tympanic membranes and canals are normal. Pharyngeal area is normal. Neck is supple without adenopathy or thyromegaly. Cardiac exam shows a regular sinus rhythm without murmurs or gallops. Lungs are clear to auscultation.  Abdomen normal.  Femoral pulses good.        Assessment & Plan:  PVD (peripheral vascular disease) ([place]) - Plan: CBC with Differential/Platelet, Comprehensive metabolic panel, Lipid panel  Need for influenza vaccination - Plan: Flu vaccine HIGH DOSE PF (Fluzone High dose)  Tobacco abuse  History of breast cancer  Essential hypertension - Plan: CBC with Differential/Platelet, Comprehensive metabolic panel  Atherosclerosis of native artery of both lower extremities with intermittent claudication (HCC) - Plan: CBC with Differential/Platelet, Comprehensive metabolic panel, Lipid panel  Family history of heart disease - Plan: CBC with Differential/Platelet, Comprehensive metabolic panel, Lipid panel This point she seems to be holding her own.  I again discussed possible smoking cessation but as mentioned above she is not ready.  Her symptoms of the leg cramps or not diagnostic of  PVD at least at this point.

## 2018-01-19 LAB — COMPREHENSIVE METABOLIC PANEL
ALBUMIN: 4.5 g/dL (ref 3.5–4.8)
ALK PHOS: 123 IU/L — AB (ref 39–117)
ALT: 13 IU/L (ref 0–32)
AST: 21 IU/L (ref 0–40)
Albumin/Globulin Ratio: 1.2 (ref 1.2–2.2)
BUN / CREAT RATIO: 17 (ref 12–28)
BUN: 15 mg/dL (ref 8–27)
Bilirubin Total: 0.5 mg/dL (ref 0.0–1.2)
CALCIUM: 10 mg/dL (ref 8.7–10.3)
CO2: 24 mmol/L (ref 20–29)
CREATININE: 0.88 mg/dL (ref 0.57–1.00)
Chloride: 95 mmol/L — ABNORMAL LOW (ref 96–106)
GFR calc Af Amer: 76 mL/min/{1.73_m2} (ref >59)
GFR, EST NON AFRICAN AMERICAN: 66 mL/min/{1.73_m2} (ref >59)
GLOBULIN, TOTAL: 3.8 g/dL (ref 1.5–4.5)
Glucose: 92 mg/dL (ref 65–99)
POTASSIUM: 3.6 mmol/L (ref 3.5–5.2)
SODIUM: 138 mmol/L (ref 134–144)
Total Protein: 8.3 g/dL (ref 6.0–8.5)

## 2018-01-19 LAB — LIPID PANEL
CHOL/HDL RATIO: 2.3 ratio (ref 0.0–4.4)
Cholesterol, Total: 177 mg/dL (ref 100–199)
HDL: 78 mg/dL (ref >39)
LDL CALC: 77 mg/dL (ref 0–99)
TRIGLYCERIDES: 108 mg/dL (ref 0–149)
VLDL Cholesterol Cal: 22 mg/dL (ref 5–40)

## 2018-01-19 LAB — CBC WITH DIFFERENTIAL/PLATELET
BASOS: 0 %
Basophils Absolute: 0 10*3/uL (ref 0.0–0.2)
EOS (ABSOLUTE): 0.1 10*3/uL (ref 0.0–0.4)
EOS: 1 %
HEMATOCRIT: 40.3 % (ref 34.0–46.6)
HEMOGLOBIN: 13.5 g/dL (ref 11.1–15.9)
Immature Grans (Abs): 0 10*3/uL (ref 0.0–0.1)
Immature Granulocytes: 0 %
LYMPHS ABS: 2.5 10*3/uL (ref 0.7–3.1)
Lymphs: 51 %
MCH: 28.8 pg (ref 26.6–33.0)
MCHC: 33.5 g/dL (ref 31.5–35.7)
MCV: 86 fL (ref 79–97)
MONOS ABS: 0.3 10*3/uL (ref 0.1–0.9)
Monocytes: 6 %
Neutrophils Absolute: 2.1 10*3/uL (ref 1.4–7.0)
Neutrophils: 42 %
Platelets: 194 10*3/uL (ref 150–379)
RBC: 4.68 x10E6/uL (ref 3.77–5.28)
RDW: 15.3 % (ref 12.3–15.4)
WBC: 5 10*3/uL (ref 3.4–10.8)

## 2018-02-01 ENCOUNTER — Other Ambulatory Visit: Payer: Self-pay | Admitting: Family Medicine

## 2018-02-01 DIAGNOSIS — I10 Essential (primary) hypertension: Secondary | ICD-10-CM

## 2018-04-01 ENCOUNTER — Other Ambulatory Visit: Payer: Self-pay | Admitting: Family Medicine

## 2018-05-01 ENCOUNTER — Other Ambulatory Visit: Payer: Self-pay | Admitting: Family Medicine

## 2018-05-01 DIAGNOSIS — I10 Essential (primary) hypertension: Secondary | ICD-10-CM

## 2018-06-14 DIAGNOSIS — L821 Other seborrheic keratosis: Secondary | ICD-10-CM | POA: Diagnosis not present

## 2018-06-14 DIAGNOSIS — L720 Epidermal cyst: Secondary | ICD-10-CM | POA: Diagnosis not present

## 2018-07-04 ENCOUNTER — Other Ambulatory Visit: Payer: Self-pay | Admitting: Family Medicine

## 2018-07-05 ENCOUNTER — Telehealth: Payer: Self-pay | Admitting: Family Medicine

## 2018-07-05 NOTE — Telephone Encounter (Signed)
Pt called back and made medcheck appt for sept 16 and would like her refill for plavix

## 2018-07-05 NOTE — Telephone Encounter (Signed)
Called patient to schedule a medication management appt.

## 2018-07-05 NOTE — Telephone Encounter (Signed)
Called pt and she states that Dr Gwenlyn Found Cardiologist  gave her one when she had bypass surgery on her both her legs, states she still has a hard time walking long distances, states he it has been several years since she has seen him,

## 2018-07-05 NOTE — Telephone Encounter (Signed)
Pt called and is requesting a handicap placard be filled out put in your folder she can be reached at [phone]

## 2018-07-06 NOTE — Telephone Encounter (Signed)
Informed pt that form was ready to be picked up

## 2018-08-02 ENCOUNTER — Ambulatory Visit (INDEPENDENT_AMBULATORY_CARE_PROVIDER_SITE_OTHER): Payer: Medicare HMO | Admitting: Family Medicine

## 2018-08-02 ENCOUNTER — Encounter: Payer: Self-pay | Admitting: Family Medicine

## 2018-08-02 VITALS — BP 110/70 | HR 82 | Temp 98.2°F | Wt 132.6 lb

## 2018-08-02 DIAGNOSIS — Z720 Tobacco use: Secondary | ICD-10-CM | POA: Diagnosis not present

## 2018-08-02 DIAGNOSIS — I10 Essential (primary) hypertension: Secondary | ICD-10-CM

## 2018-08-02 DIAGNOSIS — I6523 Occlusion and stenosis of bilateral carotid arteries: Secondary | ICD-10-CM

## 2018-08-02 DIAGNOSIS — J301 Allergic rhinitis due to pollen: Secondary | ICD-10-CM

## 2018-08-02 DIAGNOSIS — Z853 Personal history of malignant neoplasm of breast: Secondary | ICD-10-CM | POA: Diagnosis not present

## 2018-08-02 DIAGNOSIS — I70213 Atherosclerosis of native arteries of extremities with intermittent claudication, bilateral legs: Secondary | ICD-10-CM | POA: Diagnosis not present

## 2018-08-02 DIAGNOSIS — I739 Peripheral vascular disease, unspecified: Secondary | ICD-10-CM | POA: Diagnosis not present

## 2018-08-02 NOTE — Patient Instructions (Signed)
Call [phone] talk to them and let them help you with the smoking okay you can do that is this matter of finding a stuff that will work the best for you with your your half a pack per day which is not much and it is hard to say her friends you use him like you know you have you have a couple coffee in the morning the [phone]

## 2018-08-02 NOTE — Progress Notes (Signed)
   Subjective:    Patient ID: [name], female    DOB: Oct 21, 1946, 72 y.o.   MRN: [id number]  HPI She is here for evaluation of multiple issues.  She has lost weight over the last year or 2.  She has had no abdominal pain, anorexia, early satiety, change in bowel habits.  She did have a colonoscopy in 2012.  She does smoke 1/2 pack/day and has tried to quit.  She also complains of intermittent difficulty with a burning sensation in her feet.  She does have a previous history of PVD with surgery.  She has not followed up with [facility] in several years.  She also has a history of carotid stenosis however previous studies showed minimal stenosis.  She has had no headache, blurred or double vision.  Does have a previous history of TIA.   Review of Systems     Objective:   Physical Exam Alert and in no distress. Tympanic membranes and canals are normal. Pharyngeal area is normal. Neck is supple without adenopathy or thyromegaly. Cardiac exam shows a regular sinus rhythm without murmurs or gallops. Lungs are clear to auscultation. Peripheral pulses were difficult to feel.  Skin appears normal. Recent blood work was reviewed.      Assessment & Plan:  PVD (peripheral vascular disease) ([place])  Tobacco abuse  History of breast cancer  Essential hypertension  Atherosclerosis of native artery of both lower extremities with intermittent claudication (HCC)  Carotid stenosis, asymptomatic, bilateral  Allergic rhinitis due to pollen, unspecified seasonality I will refer back to CVTS and will also have her see GI.

## 2018-08-03 DIAGNOSIS — R220 Localized swelling, mass and lump, head: Secondary | ICD-10-CM | POA: Insufficient documentation

## 2018-08-04 ENCOUNTER — Other Ambulatory Visit: Payer: Self-pay

## 2018-08-04 DIAGNOSIS — R634 Abnormal weight loss: Secondary | ICD-10-CM

## 2018-08-05 ENCOUNTER — Other Ambulatory Visit: Payer: Self-pay

## 2018-08-05 ENCOUNTER — Telehealth: Payer: Self-pay

## 2018-08-05 DIAGNOSIS — M79605 Pain in left leg: Principal | ICD-10-CM

## 2018-08-05 DIAGNOSIS — M79604 Pain in right leg: Secondary | ICD-10-CM

## 2018-08-05 NOTE — Telephone Encounter (Signed)
PVD.  Leg pain.

## 2018-08-05 NOTE — Telephone Encounter (Signed)
Need dx code for CV[facility] referral. Please advise [facility]

## 2018-08-10 DIAGNOSIS — H35362 Drusen (degenerative) of macula, left eye: Secondary | ICD-10-CM | POA: Diagnosis not present

## 2018-08-10 DIAGNOSIS — H2513 Age-related nuclear cataract, bilateral: Secondary | ICD-10-CM | POA: Diagnosis not present

## 2018-08-10 DIAGNOSIS — H35033 Hypertensive retinopathy, bilateral: Secondary | ICD-10-CM | POA: Diagnosis not present

## 2018-08-10 DIAGNOSIS — H25013 Cortical age-related cataract, bilateral: Secondary | ICD-10-CM | POA: Diagnosis not present

## 2018-08-10 LAB — HM DIABETES EYE EXAM

## 2018-08-12 ENCOUNTER — Other Ambulatory Visit: Payer: Self-pay

## 2018-08-12 DIAGNOSIS — I739 Peripheral vascular disease, unspecified: Secondary | ICD-10-CM

## 2018-08-16 ENCOUNTER — Telehealth: Payer: Self-pay | Admitting: Family Medicine

## 2018-08-16 NOTE — Telephone Encounter (Signed)
Received a fax from [facility] concerning up coming surgery. They are needed instructions concerning blood thinner. Sending back.

## 2018-08-20 NOTE — Telephone Encounter (Signed)
She can continue on her present medication.

## 2018-08-20 NOTE — Telephone Encounter (Signed)
Dr. Doy Mince office @ [place] called and they need specific instructions if pt needs to come off blood thinner 3 days prior to surgery or continue taking?  Please call# [phone] Edwena Blow ok to leave message with instructions

## 2018-08-24 NOTE — Telephone Encounter (Signed)
Office advised [employer]

## 2018-08-25 DIAGNOSIS — R220 Localized swelling, mass and lump, head: Secondary | ICD-10-CM | POA: Diagnosis not present

## 2018-08-25 DIAGNOSIS — L720 Epidermal cyst: Secondary | ICD-10-CM | POA: Diagnosis not present

## 2018-08-25 DIAGNOSIS — F17210 Nicotine dependence, cigarettes, uncomplicated: Secondary | ICD-10-CM | POA: Diagnosis not present

## 2018-08-25 DIAGNOSIS — I10 Essential (primary) hypertension: Secondary | ICD-10-CM | POA: Diagnosis not present

## 2018-09-03 ENCOUNTER — Encounter: Payer: Medicare HMO | Admitting: Vascular Surgery

## 2018-09-03 ENCOUNTER — Encounter (HOSPITAL_COMMUNITY): Payer: Medicare HMO

## 2018-09-26 ENCOUNTER — Other Ambulatory Visit: Payer: Self-pay | Admitting: Family Medicine

## 2018-09-26 DIAGNOSIS — I10 Essential (primary) hypertension: Secondary | ICD-10-CM

## 2018-10-17 HISTORY — PX: CATARACT EXTRACTION: SUR2

## 2018-11-06 ENCOUNTER — Other Ambulatory Visit: Payer: Self-pay | Admitting: Family Medicine

## 2018-12-07 DIAGNOSIS — H25011 Cortical age-related cataract, right eye: Secondary | ICD-10-CM | POA: Diagnosis not present

## 2018-12-07 DIAGNOSIS — H25013 Cortical age-related cataract, bilateral: Secondary | ICD-10-CM | POA: Diagnosis not present

## 2018-12-07 DIAGNOSIS — H35033 Hypertensive retinopathy, bilateral: Secondary | ICD-10-CM | POA: Diagnosis not present

## 2018-12-07 DIAGNOSIS — H2513 Age-related nuclear cataract, bilateral: Secondary | ICD-10-CM | POA: Diagnosis not present

## 2018-12-07 DIAGNOSIS — H2511 Age-related nuclear cataract, right eye: Secondary | ICD-10-CM | POA: Diagnosis not present

## 2018-12-07 DIAGNOSIS — H35363 Drusen (degenerative) of macula, bilateral: Secondary | ICD-10-CM | POA: Diagnosis not present

## 2018-12-09 DIAGNOSIS — H269 Unspecified cataract: Secondary | ICD-10-CM | POA: Insufficient documentation

## 2018-12-09 DIAGNOSIS — Z9841 Cataract extraction status, right eye: Secondary | ICD-10-CM | POA: Insufficient documentation

## 2018-12-09 DIAGNOSIS — Z9842 Cataract extraction status, left eye: Secondary | ICD-10-CM | POA: Insufficient documentation

## 2018-12-15 DIAGNOSIS — H25811 Combined forms of age-related cataract, right eye: Secondary | ICD-10-CM | POA: Diagnosis not present

## 2018-12-15 DIAGNOSIS — H2511 Age-related nuclear cataract, right eye: Secondary | ICD-10-CM | POA: Diagnosis not present

## 2018-12-20 ENCOUNTER — Emergency Department (HOSPITAL_COMMUNITY)
Admission: EM | Admit: 2018-12-20 | Discharge: 2018-12-21 | Disposition: A | Discharge status: Home / Self Care | Payer: Medicare HMO | Attending: Emergency Medicine | Admitting: Emergency Medicine

## 2018-12-20 ENCOUNTER — Other Ambulatory Visit: Payer: Self-pay

## 2018-12-20 ENCOUNTER — Encounter (HOSPITAL_COMMUNITY): Payer: Self-pay | Admitting: *Deleted

## 2018-12-20 DIAGNOSIS — Z7982 Long term (current) use of aspirin: Secondary | ICD-10-CM | POA: Diagnosis not present

## 2018-12-20 DIAGNOSIS — I739 Peripheral vascular disease, unspecified: Secondary | ICD-10-CM | POA: Diagnosis not present

## 2018-12-20 DIAGNOSIS — Z8673 Personal history of transient ischemic attack (TIA), and cerebral infarction without residual deficits: Secondary | ICD-10-CM | POA: Diagnosis not present

## 2018-12-20 DIAGNOSIS — R319 Hematuria, unspecified: Secondary | ICD-10-CM | POA: Diagnosis not present

## 2018-12-20 DIAGNOSIS — Z91040 Latex allergy status: Secondary | ICD-10-CM | POA: Diagnosis not present

## 2018-12-20 DIAGNOSIS — Z79899 Other long term (current) drug therapy: Secondary | ICD-10-CM | POA: Insufficient documentation

## 2018-12-20 DIAGNOSIS — I10 Essential (primary) hypertension: Secondary | ICD-10-CM | POA: Insufficient documentation

## 2018-12-20 DIAGNOSIS — F17210 Nicotine dependence, cigarettes, uncomplicated: Secondary | ICD-10-CM | POA: Insufficient documentation

## 2018-12-20 DIAGNOSIS — K8020 Calculus of gallbladder without cholecystitis without obstruction: Secondary | ICD-10-CM | POA: Diagnosis not present

## 2018-12-20 LAB — COMPREHENSIVE METABOLIC PANEL
ALT: 25 U/L (ref 0–44)
AST: 30 U/L (ref 15–41)
Albumin: 3.7 g/dL (ref 3.5–5.0)
Alkaline Phosphatase: 93 U/L (ref 38–126)
Anion gap: 13 (ref 5–15)
BUN: 13 mg/dL (ref 8–23)
CO2: 30 mmol/L (ref 22–32)
Calcium: 9.8 mg/dL (ref 8.9–10.3)
Chloride: 96 mmol/L — ABNORMAL LOW (ref 98–111)
Creatinine, Ser: 1.12 mg/dL — ABNORMAL HIGH (ref 0.44–1.00)
GFR calc Af Amer: 57 mL/min — ABNORMAL LOW (ref >60)
GFR, EST NON AFRICAN AMERICAN: 49 mL/min — AB (ref >60)
Glucose, Bld: 94 mg/dL (ref 70–99)
Potassium: 3.3 mmol/L — ABNORMAL LOW (ref 3.5–5.1)
Sodium: 139 mmol/L (ref 135–145)
Total Bilirubin: 0.4 mg/dL (ref 0.3–1.2)
Total Protein: 7.8 g/dL (ref 6.5–8.1)

## 2018-12-20 LAB — CBC
HCT: 39.5 % (ref 36.0–46.0)
Hemoglobin: 12.8 g/dL (ref 12.0–15.0)
MCH: 29.2 pg (ref 26.0–34.0)
MCHC: 32.4 g/dL (ref 30.0–36.0)
MCV: 90 fL (ref 80.0–100.0)
Platelets: 204 10*3/uL (ref 150–400)
RBC: 4.39 MIL/uL (ref 3.87–5.11)
RDW: 14.8 % (ref 11.5–15.5)
WBC: 6.8 10*3/uL (ref 4.0–10.5)
nRBC: 0.3 % — ABNORMAL HIGH (ref 0.0–0.2)

## 2018-12-20 NOTE — ED Triage Notes (Signed)
Pt reports having urge to have bowel movement today, sat down on toilet but reports not having a BM. Stood up and had large amount of dark blood in toilet. Denies pain. Pt currently taking plavix. No acute distress is noted at triage.

## 2018-12-21 ENCOUNTER — Other Ambulatory Visit: Payer: Self-pay

## 2018-12-21 ENCOUNTER — Emergency Department (HOSPITAL_COMMUNITY): Payer: Medicare HMO

## 2018-12-21 ENCOUNTER — Telehealth: Payer: Self-pay | Admitting: Family Medicine

## 2018-12-21 ENCOUNTER — Emergency Department (HOSPITAL_COMMUNITY)
Admission: EM | Admit: 2018-12-21 | Discharge: 2018-12-21 | Disposition: A | Discharge status: Home / Self Care | Payer: Medicare HMO | Source: Home / Self Care | Attending: Emergency Medicine | Admitting: Emergency Medicine

## 2018-12-21 ENCOUNTER — Encounter (HOSPITAL_COMMUNITY): Payer: Self-pay | Admitting: Emergency Medicine

## 2018-12-21 DIAGNOSIS — Z8673 Personal history of transient ischemic attack (TIA), and cerebral infarction without residual deficits: Secondary | ICD-10-CM | POA: Insufficient documentation

## 2018-12-21 DIAGNOSIS — R319 Hematuria, unspecified: Secondary | ICD-10-CM

## 2018-12-21 DIAGNOSIS — I739 Peripheral vascular disease, unspecified: Secondary | ICD-10-CM

## 2018-12-21 DIAGNOSIS — I10 Essential (primary) hypertension: Secondary | ICD-10-CM | POA: Insufficient documentation

## 2018-12-21 DIAGNOSIS — F17210 Nicotine dependence, cigarettes, uncomplicated: Secondary | ICD-10-CM

## 2018-12-21 DIAGNOSIS — K8020 Calculus of gallbladder without cholecystitis without obstruction: Secondary | ICD-10-CM | POA: Diagnosis not present

## 2018-12-21 LAB — URINALYSIS, ROUTINE W REFLEX MICROSCOPIC: Bacteria, UA: NONE SEEN

## 2018-12-21 LAB — URINALYSIS, MICROSCOPIC (REFLEX)
Bacteria, UA: NONE SEEN
RBC / HPF: >50 RBC/hpf (ref 0–5)
SQUAMOUS EPITHELIAL / LPF: NONE SEEN (ref 0–5)
WBC, UA: NONE SEEN WBC/hpf (ref 0–5)

## 2018-12-21 LAB — CBC
HCT: 36.6 % (ref 36.0–46.0)
Hemoglobin: 12 g/dL (ref 12.0–15.0)
MCH: 29.4 pg (ref 26.0–34.0)
MCHC: 32.8 g/dL (ref 30.0–36.0)
MCV: 89.7 fL (ref 80.0–100.0)
PLATELETS: 186 10*3/uL (ref 150–400)
RBC: 4.08 MIL/uL (ref 3.87–5.11)
RDW: 14.7 % (ref 11.5–15.5)
WBC: 6.4 10*3/uL (ref 4.0–10.5)
nRBC: 0 % (ref 0.0–0.2)

## 2018-12-21 LAB — POC OCCULT BLOOD, ED: Fecal Occult Bld: NEGATIVE

## 2018-12-21 MED ORDER — FENTANYL CITRATE (PF) 100 MCG/2ML IJ SOLN
50.0000 ug | Freq: Once | INTRAMUSCULAR | Status: AC
  Administered 2018-12-21: 50 ug via INTRAMUSCULAR
  Filled 2018-12-21: qty 2

## 2018-12-21 MED ORDER — OXYCODONE-ACETAMINOPHEN 5-325 MG PO TABS: 1.0000 | ORAL_TABLET | Freq: Four times a day (QID) | ORAL | 0 refills | Status: DC | PRN

## 2018-12-21 NOTE — ED Provider Notes (Signed)
[facility] EMERGENCY DEPARTMENT Provider Note   CSN: [id number] Arrival date & time: 12/20/18  5809     History   Chief Complaint Chief Complaint  Patient presents with  . Rectal Bleeding    HPI [name] is a 73 y.o. female.  73 year old female with history of TIA, hypertension, dyslipidemia, breast cancer presents to the emergency department for evaluation of hematuria.  She states that she sat down on the toilet and had the urge to have a bowel movement; stood up noticing a large amount of dark red blood in the toilet.  Believes she passed some small clots with her first void. She has continued to note hematuria with each void with blood on her toilet tissue.  No associated rectal pain, abdominal pain, constipation, vomiting, back pain.  She notes that the large amount of blood persistently in the toilet bowl scares her.  Abdominal surgical history significant for abdominal hysterectomy, ventral hernia repair.  She is on chronic Plavix.  The history is provided by the patient. No language interpreter was used.    Past Medical History:  Diagnosis Date  . Abdominal wall hernia   . Blood transfusion    30+ yrs. ago  . Cancer (HCC)    Right Breast  . Hyperlipidemia   . Hypertension   . Peripheral vascular disease ([place])   . Seasonal allergies   . TIA (transient ischemic attack)     Patient Active Problem List   Diagnosis Date Noted  . Cataract 12/09/2018  . TIA (transient ischemic attack) 01/07/2013  . Carotid stenosis, asymptomatic 01/07/2013  . Atherosclerosis of native artery of extremity with intermittent claudication ([place]) 05/12/2012  . Hypertension   . Tobacco abuse 12/03/2011  . PVD (peripheral vascular disease) ([place]) 08/13/2011  . History of breast cancer 08/13/2011  . Allergic rhinitis due to pollen 08/13/2011    Past Surgical History:  Procedure Laterality Date  . ABDOMINAL HYSTERECTOMY    . Aortobifemoral BPG  05/15/11   Using a  14 x8 bifurcated Dacron Graft  . BREAST SURGERY     right lumpectomy  . BREAST SURGERY     multiple breast surgeries  . COLONOSCOPY  2012   [facility]  . DILATION AND CURETTAGE OF UTERUS     3-4 in late 20"s  . NM MYOCAR PERF WALL MOTION  03/11/2011   mild to moderate perfusion defect in the apical and apical lateral region c/w an infarct/scar  . VASCULAR SURGERY  05/15/11   aobifemoral bypass graft  . VENTRAL HERNIA REPAIR  12/26/2011   Procedure: LAPAROSCOPIC VENTRAL HERNIA;  Surgeon: Adin Hector, MD;  Location: [facility] ORS;  Service: General;  Laterality: N/A;  With Mesh     OB History   No obstetric history on file.      Home Medications    Prior to Admission medications   Medication Sig Start Date End Date Taking? Authorizing Provider  aspirin EC 81 MG tablet Take 81 mg by mouth daily before breakfast.    [provider]  atorvastatin (LIPITOR) 20 MG tablet Take 1 tablet (20 mg total) by mouth daily. 01/18/18   Denita Lung, MD  cholecalciferol (VITAMIN D) 1000 units tablet Take 1,000 Units by mouth daily.    [provider]  clopidogrel (PLAVIX) 75 MG tablet TAKE 1 TABLET BY MOUTH ONCE DAILY 11/08/18   Denita Lung, MD  hydrochlorothiazide (HYDRODIURIL) 25 MG tablet TAKE 1 TABLET BY MOUTH ONCE DAILY 09/27/18  Denita Lung, MD  ketotifen (ZADITOR) 0.025 % ophthalmic solution Place 1 drop into both eyes daily as needed. For dry/itchy allergy eyes.    [provider]  triamcinolone cream (KENALOG) 0.1 % Apply 1 application topically as needed. For rash.    [provider]  potassium chloride (K-DUR) 10 MEQ tablet Take 2 tablets (20 mEq total) by mouth 2 (two) times daily. 12/19/11 01/27/12  Michael Boston, MD    Family History Family History  Problem Relation Age of Onset  . Heart attack Sister   . Heart attack Brother   . Stroke Sister   . Cancer Mother        BREAST, BRAIN  . Heart disease Mother   . Cancer Paternal Aunt        breast  ca  . Cancer Paternal Grandmother        unknown ca    Social History Social History   Tobacco Use  . Smoking status: Current Every Day Smoker    Packs/day: 0.50    Years: 30.00    Pack years: 15.00    Types: Cigarettes  . Smokeless tobacco: Never Used  . Tobacco comment: pt states that she uses the smokeless cig and is trying to quit  Substance Use Topics  . Alcohol use: No    Comment: occasional alcohol intake  . Drug use: No     Allergies   Latex   Review of Systems Review of Systems  Constitutional: Negative for fever.  Gastrointestinal: Negative for abdominal pain.  Genitourinary: Positive for hematuria. Negative for dysuria.  Neurological: Negative for syncope.  Ten systems reviewed and are negative for acute change, except as noted in the HPI.    Physical Exam Updated Vital Signs BP 132/76 (BP Location: Left Arm)   Pulse 85   Temp 98.8 F (37.1 C) (Oral)   Resp 16   SpO2 99%   Physical Exam Vitals signs and nursing note reviewed.  Constitutional:      General: She is not in acute distress.    Appearance: She is well-developed. She is not diaphoretic.     Comments: Nontoxic appearing, pleasant.  HENT:     Head: Normocephalic and atraumatic.  Eyes:     General: No scleral icterus.    Conjunctiva/sclera: Conjunctivae normal.  Neck:     Musculoskeletal: Normal range of motion.  Cardiovascular:     Rate and Rhythm: Normal rate and regular rhythm.     Pulses: Normal pulses.  Pulmonary:     Effort: Pulmonary effort is normal. No respiratory distress.     Breath sounds: No wheezing.     Comments: Respirations even and unlabored Abdominal:     Palpations: Abdomen is soft.  Genitourinary:    Comments: Exam chaperoned by Tray, RN. Normal rectal tone. No fissure or hemorrhoids noted. Soft, brown stool on DRE. Musculoskeletal: Normal range of motion.  Skin:    General: Skin is warm and dry.     Coloration: Skin is not pale.     Findings: No erythema  or rash.  Neurological:     Mental Status: She is alert and oriented to person, place, and time.  Psychiatric:        Behavior: Behavior normal.      ED Treatments / Results  Labs (all labs ordered are listed, but only abnormal results are displayed) Labs Reviewed  COMPREHENSIVE METABOLIC PANEL - Abnormal; Notable for the following components:      Result Value  Potassium 3.3 (*)    Chloride 96 (*)    Creatinine, Ser 1.12 (*)    GFR calc non Af Amer 49 (*)    GFR calc Af Amer 57 (*)    All other components within normal limits  CBC - Abnormal; Notable for the following components:   nRBC 0.3 (*)    All other components within normal limits  URINALYSIS, ROUTINE W REFLEX MICROSCOPIC  CBC  POC OCCULT BLOOD, ED    EKG None  Radiology No results found.  Procedures Procedures (including critical care time)  Medications Ordered in ED Medications - No data to display   Initial Impression / Assessment and Plan / ED Course  I have reviewed the triage vital signs and the nursing notes.  Pertinent labs & imaging results that were available during my care of the patient were reviewed by me and considered in my medical decision making (see chart for details).     73 year old female presents for painless hematuria.  She has been hemodynamically stable since arrival 12 hours ago.  Noted to have gross bright red blood on bedside urine specimen.  She is Hemoccult negative with reassuring CBC.  The patient is on chronic aspirin and Plavix.  Plan to send urinalysis to evaluate for possible infection.  Symptoms felt less likely secondary to kidney stone given absence of abdominal or back pain.  She has a history of breast cancer as well.  Will obtain CT scan to evaluate for large mass and assess serial CBC.  Patient signed out to [facility], PA-C at shift change who will assume care and disposition appropriately.   Final Clinical Impressions(s) / ED Diagnoses   Final  diagnoses:  Hematuria, unspecified type    ED Discharge Orders    None       Antonietta Breach, PA-C 12/21/18 [id number]    Ward, Delice Bison, DO 12/21/18 [phone]

## 2018-12-21 NOTE — ED Notes (Signed)
Patient verbalizes understanding of discharge instructions. Opportunity for questioning and answers were provided. Pt discharged from ED. 

## 2018-12-21 NOTE — Discharge Instructions (Addendum)
You were evaluated today for blood in your urine.  You do have a clot in your bladder.  You were able to empty her bladder fully in the emergency department.  Please follow-up with urology over the next 1 to 2 days for reevaluation.  Their contact information is listed on your discharge paperwork.  You will need to call them to schedule an appointment.  Make sure to drink lots of fluids to stay adequately hydrated.  If you are unable to void or fully empty your bladder please return to the emergency department.  Return to the ED for any worsening symptoms.

## 2018-12-21 NOTE — ED Notes (Signed)
Pt complaining of the wait time,

## 2018-12-21 NOTE — ED Notes (Signed)
This RN, with help of another RN, inserted 3-way foley catheter.  Able to advance foley catheter into urethra to the point of getting bloody urine return, at which point met a great deal of resistance.  Attempted to advance catheter further, unable to do so.  Able to flush catheter with appx 50 mL saline and get some fluid return, though fluid was slow to return into foley bag.  MD made aware.

## 2018-12-21 NOTE — ED Provider Notes (Signed)
Emergency Department Provider Note   I have reviewed the triage vital signs and the nursing notes.   HISTORY  Chief Complaint Hematuria   HPI [name] is a 73 y.o. female who presents here for lower abdominal pain.  Patient was seen in the emergency room at [facility] and discharged earlier today with diagnosis of hematuria of unclear etiology.  She has urology follow-up in 2 days.  Patient states that since she went home she had increasing pain and increasing difficulty urinating.  She is had this before with UTIs but never to this extent.  Sometimes she has had to have catheters placed because of UTIs.  Patient without any fevers, nausea, vomiting, back pain at this time.  She has a history of cancer but not bladder cancer as far she knows. No other associated or modifying symptoms.    Past Medical History:  Diagnosis Date  . Abdominal wall hernia   . Blood transfusion    30+ yrs. ago  . Cancer (HCC)    Right Breast  . Hyperlipidemia   . Hypertension   . Peripheral vascular disease ([place])   . Seasonal allergies   . TIA (transient ischemic attack)     Patient Active Problem List   Diagnosis Date Noted  . Cataract 12/09/2018  . TIA (transient ischemic attack) 01/07/2013  . Carotid stenosis, asymptomatic 01/07/2013  . Atherosclerosis of native artery of extremity with intermittent claudication ([place]) 05/12/2012  . Hypertension   . Tobacco abuse 12/03/2011  . PVD (peripheral vascular disease) ([place]) 08/13/2011  . History of breast cancer 08/13/2011  . Allergic rhinitis due to pollen 08/13/2011    Past Surgical History:  Procedure Laterality Date  . ABDOMINAL HYSTERECTOMY    . Aortobifemoral BPG  05/15/11   Using a 14 x8 bifurcated Dacron Graft  . BREAST SURGERY     right lumpectomy  . BREAST SURGERY     multiple breast surgeries  . COLONOSCOPY  2012   [place]  . DILATION AND CURETTAGE OF UTERUS     3-4 in late 20"s  . NM MYOCAR PERF WALL MOTION  03/11/2011     mild to moderate perfusion defect in the apical and apical lateral region c/w an infarct/scar  . VASCULAR SURGERY  05/15/11   aobifemoral bypass graft  . VENTRAL HERNIA REPAIR  12/26/2011   Procedure: LAPAROSCOPIC VENTRAL HERNIA;  Surgeon: Adin Hector, MD;  Location: [facility];  Service: General;  Laterality: N/A;  With Mesh    Current Outpatient Rx  . Order #: [id number] Class: Historical Med  . Order #: [id number] Class: Normal  . Order #: [id number] Class: Historical Med  . Order #: [id number] Class: Normal  . Order #: [id number] Class: Normal  . Order #: [id number] Class: Historical Med  . Order #: [id number] Class: Historical Med  . Order #: [id number] Class: Historical Med  . Order #: [id number] Class: Historical Med  . Order #: [id number] Class: Print    Allergies Latex  Family History  Problem Relation Age of Onset  . Heart attack Sister   . Heart attack Brother   . Stroke Sister   . Cancer Mother        BREAST, BRAIN  . Heart disease Mother   . Cancer Paternal Aunt        breast ca  . Cancer Paternal Grandmother        unknown ca    Social History Social History   Tobacco Use  . Smoking status: Current Every Day Smoker  Packs/day: 0.50    Years: 30.00    Pack years: 15.00    Types: Cigarettes  . Smokeless tobacco: Never Used  . Tobacco comment: pt states that she uses the smokeless cig and is trying to quit  Substance Use Topics  . Alcohol use: No    Comment: occasional alcohol intake  . Drug use: No    Review of Systems  All other systems negative except as documented in the HPI. All pertinent positives and negatives as reviewed in the HPI. ____________________________________________   PHYSICAL EXAM:  VITAL SIGNS: ED Triage Vitals [12/21/18 1659]  Enc Vitals Group     BP (!) 155/77     Pulse Rate (!) 116     Resp 18     Temp 98.1 F (36.7 C)     Temp Source Oral     SpO2 99 %     Weight      Height      Head Circumference      Peak Flow      Pain  Score 10     Pain Loc      Pain Edu?      Excl. in [place]?     Constitutional: Alert and oriented. Well appearing and in moderate distress which appears to be 2/2 pain. Eyes: Conjunctivae are normal. PERRL. EOMI. Head: Atraumatic. Nose: No congestion/rhinnorhea. Mouth/Throat: Mucous membranes are moist.  Oropharynx non-erythematous. Neck: No stridor.  No meningeal signs.   Cardiovascular: Normal rate, regular rhythm. Good peripheral circulation. Grossly normal heart sounds.   Respiratory: Normal respiratory effort.  No retractions. Lungs CTAB. Gastrointestinal: Soft and nontender. No distention.  Musculoskeletal: No lower extremity tenderness nor edema. No gross deformities of extremities. Neurologic:  Normal speech and language. No gross focal neurologic deficits are appreciated.  Skin:  Skin is warm, dry and intact. No rash noted.   ____________________________________________   LABS (all labs ordered are listed, but only abnormal results are displayed)  Labs Reviewed  URINALYSIS, ROUTINE W REFLEX MICROSCOPIC - Abnormal; Notable for the following components:      Result Value   APPearance CLOUDY (*)    Glucose, UA   (*)    Value: TEST NOT REPORTED DUE TO COLOR INTERFERENCE OF URINE PIGMENT   Hgb urine dipstick   (*)    Value: TEST NOT REPORTED DUE TO COLOR INTERFERENCE OF URINE PIGMENT   Bilirubin Urine   (*)    Value: TEST NOT REPORTED DUE TO COLOR INTERFERENCE OF URINE PIGMENT   Ketones, ur   (*)    Value: TEST NOT REPORTED DUE TO COLOR INTERFERENCE OF URINE PIGMENT   Protein, ur   (*)    Value: TEST NOT REPORTED DUE TO COLOR INTERFERENCE OF URINE PIGMENT   Nitrite   (*)    Value: TEST NOT REPORTED DUE TO COLOR INTERFERENCE OF URINE PIGMENT   Leukocytes, UA   (*)    Value: TEST NOT REPORTED DUE TO COLOR INTERFERENCE OF URINE PIGMENT   All other components within normal limits   ____________________________________________    RADIOLOGY  Ct Renal Stone Study  Result  Date: 12/21/2018 CLINICAL DATA:  Hematuria of unknown cause EXAM: CT ABDOMEN AND PELVIS WITHOUT CONTRAST TECHNIQUE: Multidetector CT imaging of the abdomen and pelvis was performed following the standard protocol without IV contrast. COMPARISON:  08/18/2011 FINDINGS: Lower chest:  No acute finding Hepatobiliary: No focal liver abnormality.Multiple layering gallstones. Pancreas: Unremarkable. Spleen: Unremarkable. Adrenals/Urinary Tract: Negative adrenals. High-density mass eccentrically within the  right bladder measuring up to 6.2 cm. The adjacent wall is not clearly thickened and this is likely primarily from blood clot. Although over riding the right UVJ there is no right hydronephrosis. No evident mass or stone in the upper tract. The bladder wall is not thickened. Unremarkable bladder. Stomach/Bowel:  No obstruction. No appendicitis. Vascular/Lymphatic: Advanced atherosclerosis with aorto iliac graft. No mass or adenopathy. Reproductive:Hysterectomy Other: No ascites or pneumoperitoneum. Musculoskeletal: No acute abnormalities. Advanced spinal degeneration with mild scoliosis. Hip osteoarthritis on both sides. IMPRESSION: 1. 6 cm mass in the bladder likely reflecting blood clot. No upper tract cause or definite bladder wall thickening, recommend urology workup. 2. Cholelithiasis. Electronically Signed   By: Monte Fantasia M.D.   On: 12/21/2018 07:16    ____________________________________________   PROCEDURES  Procedure(s) performed:   Procedures   ____________________________________________   INITIAL IMPRESSION / ASSESSMENT AND PLAN / ED COURSE  We will attempt to place Foley catheter and irrigate.  After irrigation we will send a urinalysis.  She Artie has appropriate follow-up.  Her heart rates a little bit high suspect secondary to pain however if is still high after she is comfortable with a catheter and we will recheck a CBC to make sure she is not in early stages of shock.  Heart rate  improved with pain control.  Catheter placed and irrigated until more clear.  Patient asymptomatic.  Stable for discharge with urology follow-up in 2 days.  Catheter left in place.     Pertinent labs & imaging results that were available during my care of the patient were reviewed by me and considered in my medical decision making (see chart for details).  ____________________________________________  FINAL CLINICAL IMPRESSION(S) / ED DIAGNOSES  Final diagnoses:  Hematuria, unspecified type     MEDICATIONS GIVEN DURING THIS VISIT:  Medications  fentaNYL (SUBLIMAZE) injection 50 mcg (50 mcg Intramuscular Given 12/21/18 1831)     NEW OUTPATIENT MEDICATIONS STARTED DURING THIS VISIT:  Discharge Medication List as of 12/21/2018  9:11 PM    START taking these medications   Details  oxyCODONE-acetaminophen (PERCOCET) 5-325 MG tablet Take 1 tablet by mouth every 6 (six) hours as needed., Starting Tue 12/21/2018, Print        Note:  This note was prepared with assistance of Dragon voice recognition software. Occasional wrong-word or sound-a-like substitutions may have occurred due to the inherent limitations of voice recognition software.   Chastin Garlitz, Corene Cornea, MD 12/22/18 [id number]

## 2018-12-21 NOTE — Telephone Encounter (Signed)
She was recently discharged from the hospital and having difficulty with hematuria and frequency.  I will have her try Azo-Standard and if this does not work she is to go back to the emergency room.  Nothing needs to be done on your part.

## 2018-12-21 NOTE — Telephone Encounter (Signed)
Pt called and wants you to give her a call she is in the hospital  Ed and she wants to get you advice, on what needs to be done, she is having bleeding while she urinates, but she wants to talk to you, pt can be  Reached at [phone]

## 2018-12-21 NOTE — ED Triage Notes (Signed)
Patient c/o hematuria since yesterday. Seen at [facility] yesterday. States she was told there are clots in her bladder. Told to return if worsening for catheter insertion. C/o worsening pain.

## 2018-12-21 NOTE — ED Provider Notes (Signed)
Care transferred at shift change from [facility]. See note for further information.  In summation, a 73 year old female on Plavix who presents for gross hematuria.  Has been passing small clots but is able to empty her bladder.  No rectal bleeding.  Denies fever, chills, nausea, vomiting, abdominal pain.  Hemaglobin normal.  Hemodynamically stable.  Urinalysis pending at care transfer.  Less likely kidney stone, however CT stone study ordered to r/o mass d/t hx breast cancer. Will assess serial CBC . If CT without acute findings, Urine negative, Hgb stable, plan for dc home with Urology followup. Physical Exam  BP 113/68   Pulse 82   Temp 98.8 F (37.1 C) (Oral)   Resp 16   SpO2 100%   Physical Exam Vitals signs and nursing note reviewed.  Constitutional:      General: She is not in acute distress.    Appearance: She is well-developed. She is not ill-appearing or toxic-appearing.  HENT:     Head: Atraumatic.     Nose: Nose normal.     Mouth/Throat:     Mouth: Mucous membranes are moist.     Pharynx: Oropharynx is clear.  Eyes:     Pupils: Pupils are equal, round, and reactive to light.  Neck:     Musculoskeletal: Normal range of motion.  Cardiovascular:     Rate and Rhythm: Normal rate.  Pulmonary:     Effort: Pulmonary effort is normal. No respiratory distress.  Abdominal:     General: There is no distension.  Musculoskeletal: Normal range of motion.  Skin:    General: Skin is warm and dry.  Neurological:     Mental Status: She is alert.     ED Course/Procedures     Procedures Labs Reviewed  COMPREHENSIVE METABOLIC PANEL - Abnormal; Notable for the following components:      Result Value   Potassium 3.3 (*)    Chloride 96 (*)    Creatinine, Ser 1.12 (*)    GFR calc non Af Amer 49 (*)    GFR calc Af Amer 57 (*)    All other components within normal limits  CBC - Abnormal; Notable for the following components:   nRBC 0.3 (*)    All other components within normal  limits  URINALYSIS, ROUTINE W REFLEX MICROSCOPIC - Abnormal; Notable for the following components:   Color, Urine RED (*)    APPearance TURBID (*)    Glucose, UA   (*)    Value: TEST NOT REPORTED DUE TO COLOR INTERFERENCE OF URINE PIGMENT   Hgb urine dipstick   (*)    Value: TEST NOT REPORTED DUE TO COLOR INTERFERENCE OF URINE PIGMENT   Bilirubin Urine   (*)    Value: TEST NOT REPORTED DUE TO COLOR INTERFERENCE OF URINE PIGMENT   Ketones, ur   (*)    Value: TEST NOT REPORTED DUE TO COLOR INTERFERENCE OF URINE PIGMENT   Protein, ur   (*)    Value: TEST NOT REPORTED DUE TO COLOR INTERFERENCE OF URINE PIGMENT   Nitrite   (*)    Value: TEST NOT REPORTED DUE TO COLOR INTERFERENCE OF URINE PIGMENT   Leukocytes, UA   (*)    Value: TEST NOT REPORTED DUE TO COLOR INTERFERENCE OF URINE PIGMENT   All other components within normal limits  CBC  URINALYSIS, MICROSCOPIC (REFLEX)  POC OCCULT BLOOD, ED   Ct Renal Stone Study  Result Date: 12/21/2018 CLINICAL DATA:  Hematuria of unknown cause  EXAM: CT ABDOMEN AND PELVIS WITHOUT CONTRAST TECHNIQUE: Multidetector CT imaging of the abdomen and pelvis was performed following the standard protocol without IV contrast. COMPARISON:  08/18/2011 FINDINGS: Lower chest:  No acute finding Hepatobiliary: No focal liver abnormality.Multiple layering gallstones. Pancreas: Unremarkable. Spleen: Unremarkable. Adrenals/Urinary Tract: Negative adrenals. High-density mass eccentrically within the right bladder measuring up to 6.2 cm. The adjacent wall is not clearly thickened and this is likely primarily from blood clot. Although over riding the right UVJ there is no right hydronephrosis. No evident mass or stone in the upper tract. The bladder wall is not thickened. Unremarkable bladder. Stomach/Bowel:  No obstruction. No appendicitis. Vascular/Lymphatic: Advanced atherosclerosis with aorto iliac graft. No mass or adenopathy. Reproductive:Hysterectomy Other: No ascites or  pneumoperitoneum. Musculoskeletal: No acute abnormalities. Advanced spinal degeneration with mild scoliosis. Hip osteoarthritis on both sides. IMPRESSION: 1. 6 cm mass in the bladder likely reflecting blood clot. No upper tract cause or definite bladder wall thickening, recommend urology workup. 2. Cholelithiasis. Electronically Signed   By: Monte Fantasia M.D.   On: 12/21/2018 07:16   MDM  Care transferred at shift change from [facility]. See note for further information.  In summation, a 73 year old female on Plavix who presents for gross hematuria.  Has been passing small clots but is able to empty her bladder.  No rectal bleeding.  Denies fever, chills, nausea, vomiting, abdominal pain.  Hemaglobin normal.  Hemodynamically stable.  Urinalysis pending at care transfer.  Less likely kidney stone, however CT stone study ordered to r/o mass d/t hx breast cancer per previous provider. Will assess serial CBC . If CT without acute findings, Urine negative, Hgb stable, plan for dc home with Urology followup.  0745: CT with 6cm possible blood clot in bladder, Repeat Hgb 12.0, stable from previous over 12 hours. Patient hemodynamically stable. Voiding without difficulty currently.  CBC without leukocytosis. No lightheaded or dizziness with ambulation.  0845: Urinalysis turbid. Unable to read to assess for infection.  Discussed starting on antibiotics until patient has appointment with urology, patient would like to wait to appointment at this time for abx. Patient has voided multiple times in emergency department without difficulty over her 14 hours stay. Patient states she is able to fully empty her bladder. Patient is hemodynamically stable and appropriate for DC home at this time.  I have discussed strict return precautions with patient and family members. Patient to follow up with Urology outpatient. Patient stable for dc home at this time. Low suspicion for emergent pathology at this time.  Patient and  family voiced understanding are agreeable for follow-up.  Patient has been seen and evaluated by my attending Dr. Leonides Schanz who is in agreement with above treatment, plan and disposition of patient.    Ervin Rothbauer A, PA-C 12/21/18 [id number]

## 2018-12-22 ENCOUNTER — Encounter (HOSPITAL_COMMUNITY): Payer: Self-pay

## 2018-12-22 ENCOUNTER — Other Ambulatory Visit: Payer: Self-pay

## 2018-12-22 ENCOUNTER — Emergency Department (HOSPITAL_COMMUNITY)
Admission: EM | Admit: 2018-12-22 | Discharge: 2018-12-22 | Disposition: A | Discharge status: Home / Self Care | Payer: Medicare HMO | Attending: Emergency Medicine | Admitting: Emergency Medicine

## 2018-12-22 DIAGNOSIS — Z79899 Other long term (current) drug therapy: Secondary | ICD-10-CM | POA: Diagnosis not present

## 2018-12-22 DIAGNOSIS — F17210 Nicotine dependence, cigarettes, uncomplicated: Secondary | ICD-10-CM | POA: Insufficient documentation

## 2018-12-22 DIAGNOSIS — Z7982 Long term (current) use of aspirin: Secondary | ICD-10-CM | POA: Diagnosis not present

## 2018-12-22 DIAGNOSIS — I10 Essential (primary) hypertension: Secondary | ICD-10-CM | POA: Diagnosis not present

## 2018-12-22 DIAGNOSIS — Z7902 Long term (current) use of antithrombotics/antiplatelets: Secondary | ICD-10-CM | POA: Diagnosis not present

## 2018-12-22 DIAGNOSIS — Z436 Encounter for attention to other artificial openings of urinary tract: Secondary | ICD-10-CM | POA: Diagnosis present

## 2018-12-22 DIAGNOSIS — R319 Hematuria, unspecified: Secondary | ICD-10-CM | POA: Insufficient documentation

## 2018-12-22 LAB — CBC
HCT: 33.2 % — ABNORMAL LOW (ref 36.0–46.0)
Hemoglobin: 10.6 g/dL — ABNORMAL LOW (ref 12.0–15.0)
MCH: 29.2 pg (ref 26.0–34.0)
MCHC: 31.9 g/dL (ref 30.0–36.0)
MCV: 91.5 fL (ref 80.0–100.0)
Platelets: 169 10*3/uL (ref 150–400)
RBC: 3.63 MIL/uL — ABNORMAL LOW (ref 3.87–5.11)
RDW: 14.8 % (ref 11.5–15.5)
WBC: 8.4 10*3/uL (ref 4.0–10.5)
nRBC: 0 % (ref 0.0–0.2)

## 2018-12-22 LAB — BASIC METABOLIC PANEL
Anion gap: 11 (ref 5–15)
BUN: 16 mg/dL (ref 8–23)
CO2: 29 mmol/L (ref 22–32)
Calcium: 9.5 mg/dL (ref 8.9–10.3)
Chloride: 97 mmol/L — ABNORMAL LOW (ref 98–111)
Creatinine, Ser: 0.81 mg/dL (ref 0.44–1.00)
GFR calc Af Amer: >60 mL/min (ref >60)
GFR calc non Af Amer: >60 mL/min (ref >60)
Glucose, Bld: 88 mg/dL (ref 70–99)
Potassium: 2.9 mmol/L — ABNORMAL LOW (ref 3.5–5.1)
Sodium: 137 mmol/L (ref 135–145)

## 2018-12-22 NOTE — ED Triage Notes (Signed)
Pt was seen yesterday for the same. Pt states that now, the urine is going around the blockage.  Blood clots still noted.

## 2018-12-22 NOTE — Discharge Instructions (Addendum)
Follow-up in the urology clinic tomorrow as scheduled  please keep yourself hydrated.

## 2018-12-22 NOTE — ED Provider Notes (Signed)
[facility] DEPT Provider Note   CSN: [id number] Arrival date & time: 12/22/18  0915     History   Chief Complaint Chief Complaint  Patient presents with  . Catheter Issue    HPI [name] is a 73 y.o. female.  HPI 73 year old female presents the emergency department with hematuria and suspected clotted urinary catheter as she feels like she is leaking urine around this.  She recently was seen and evaluated for new onset hematuria.  Questionable bladder clot versus bladder mass found on CT imaging.  Catheter placed yesterday for questionable urinary retention.  She denies significant abdominal pain at this time.  No fevers or chills.  No nausea or vomiting.  No flank pain.  No history of anticoagulant use.  She is scheduled to see urology tomorrow.  She feels like she is leaking urine around the catheter.   Past Medical History:  Diagnosis Date  . Abdominal wall hernia   . Blood transfusion    30+ yrs. ago  . Cancer (HCC)    Right Breast  . Hyperlipidemia   . Hypertension   . Peripheral vascular disease ([place])   . Seasonal allergies   . TIA (transient ischemic attack)     Patient Active Problem List   Diagnosis Date Noted  . Cataract 12/09/2018  . TIA (transient ischemic attack) 01/07/2013  . Carotid stenosis, asymptomatic 01/07/2013  . Atherosclerosis of native artery of extremity with intermittent claudication ([place]) 05/12/2012  . Hypertension   . Tobacco abuse 12/03/2011  . PVD (peripheral vascular disease) ([place]) 08/13/2011  . History of breast cancer 08/13/2011  . Allergic rhinitis due to pollen 08/13/2011    Past Surgical History:  Procedure Laterality Date  . ABDOMINAL HYSTERECTOMY    . Aortobifemoral BPG  05/15/11   Using a 14 x8 bifurcated Dacron Graft  . BREAST SURGERY     right lumpectomy  . BREAST SURGERY     multiple breast surgeries  . COLONOSCOPY  2012   [place]  . DILATION AND CURETTAGE OF UTERUS     3-4 in  late 20"s  . NM MYOCAR PERF WALL MOTION  03/11/2011   mild to moderate perfusion defect in the apical and apical lateral region c/w an infarct/scar  . VASCULAR SURGERY  05/15/11   aobifemoral bypass graft  . VENTRAL HERNIA REPAIR  12/26/2011   Procedure: LAPAROSCOPIC VENTRAL HERNIA;  Surgeon: Adin Hector, MD;  Location: [facility] ORS;  Service: General;  Laterality: N/A;  With Mesh     OB History   No obstetric history on file.      Home Medications    Prior to Admission medications   Medication Sig Start Date End Date Taking? Authorizing Provider  aspirin EC 81 MG tablet Take 81 mg by mouth every evening.    Yes [provider]  atorvastatin (LIPITOR) 20 MG tablet Take 1 tablet (20 mg total) by mouth daily. 01/18/18  Yes Denita Lung, MD  cholecalciferol (VITAMIN D) 1000 units tablet Take 1,000 Units by mouth daily.   Yes [provider]  clopidogrel (PLAVIX) 75 MG tablet TAKE 1 TABLET BY MOUTH ONCE DAILY Patient taking differently: Take 75 mg by mouth daily. take 1 tablet by mouth once daily 11/08/18  Yes Denita Lung, MD  hydrochlorothiazide (HYDRODIURIL) 25 MG tablet TAKE 1 TABLET BY MOUTH ONCE DAILY Patient taking differently: Take 25 mg by mouth daily.  09/27/18  Yes Denita Lung, MD  ketotifen (ZADITOR) 0.025 %  ophthalmic solution Place 1 drop into both eyes daily as needed (dry/itchy eyes).    Yes [provider]  oxyCODONE-acetaminophen (PERCOCET) 5-325 MG tablet Take 1 tablet by mouth every 6 (six) hours as needed. 12/21/18  Yes Mesner, Corene Cornea, MD  phenazopyridine (PYRIDIUM) 95 MG tablet Take 190 mg by mouth 3 (three) times daily as needed for pain.   Yes [provider]  prednisoLONE-Gatifloxacin 1-0.5 % SOLN Place 1 drop into the right eye 4 (four) times daily. Bromfenac 0.075%   Yes [provider]  triamcinolone cream (KENALOG) 0.1 % Apply 1 application topically 2 (two) times daily as needed (rash).    Yes [provider]   potassium chloride (K-DUR) 10 MEQ tablet Take 2 tablets (20 mEq total) by mouth 2 (two) times daily. 12/19/11 01/27/12  Michael Boston, MD    Family History Family History  Problem Relation Age of Onset  . Heart attack Sister   . Heart attack Brother   . Stroke Sister   . Cancer Mother        BREAST, BRAIN  . Heart disease Mother   . Cancer Paternal Aunt        breast ca  . Cancer Paternal Grandmother        unknown ca    Social History Social History   Tobacco Use  . Smoking status: Current Every Day Smoker    Packs/day: 0.50    Years: 30.00    Pack years: 15.00    Types: Cigarettes  . Smokeless tobacco: Never Used  . Tobacco comment: pt states that she uses the smokeless cig and is trying to quit  Substance Use Topics  . Alcohol use: No    Comment: occasional alcohol intake  . Drug use: No     Allergies   Latex   Review of Systems Review of Systems  All other systems reviewed and are negative.    Physical Exam Updated Vital Signs BP 125/68 (BP Location: Left Arm)   Pulse 93   Temp 98.4 F (36.9 C) (Oral)   Resp 17   Wt 60 kg   SpO2 97%   BMI 23.81 kg/m   Physical Exam Vitals signs and nursing note reviewed.  Constitutional:      Appearance: She is well-developed.  HENT:     Head: Normocephalic.  Neck:     Musculoskeletal: Normal range of motion.  Cardiovascular:     Rate and Rhythm: Normal rate.  Pulmonary:     Effort: Pulmonary effort is normal.  Abdominal:     General: There is no distension.  Musculoskeletal: Normal range of motion.  Skin:    General: Skin is warm.  Neurological:     Mental Status: She is alert and oriented to person, place, and time.      ED Treatments / Results  Labs (all labs ordered are listed, but only abnormal results are displayed) Labs Reviewed  CBC - Abnormal; Notable for the following components:      Result Value   RBC 3.63 (*)    Hemoglobin 10.6 (*)    HCT 33.2 (*)    All other components within  normal limits  BASIC METABOLIC PANEL - Abnormal; Notable for the following components:   Potassium 2.9 (*)    Chloride 97 (*)    All other components within normal limits    EKG None  Radiology Ct Renal Stone Study  Result Date: 12/21/2018 CLINICAL DATA:  Hematuria of unknown cause EXAM: CT ABDOMEN  AND PELVIS WITHOUT CONTRAST TECHNIQUE: Multidetector CT imaging of the abdomen and pelvis was performed following the standard protocol without IV contrast. COMPARISON:  08/18/2011 FINDINGS: Lower chest:  No acute finding Hepatobiliary: No focal liver abnormality.Multiple layering gallstones. Pancreas: Unremarkable. Spleen: Unremarkable. Adrenals/Urinary Tract: Negative adrenals. High-density mass eccentrically within the right bladder measuring up to 6.2 cm. The adjacent wall is not clearly thickened and this is likely primarily from blood clot. Although over riding the right UVJ there is no right hydronephrosis. No evident mass or stone in the upper tract. The bladder wall is not thickened. Unremarkable bladder. Stomach/Bowel:  No obstruction. No appendicitis. Vascular/Lymphatic: Advanced atherosclerosis with aorto iliac graft. No mass or adenopathy. Reproductive:Hysterectomy Other: No ascites or pneumoperitoneum. Musculoskeletal: No acute abnormalities. Advanced spinal degeneration with mild scoliosis. Hip osteoarthritis on both sides. IMPRESSION: 1. 6 cm mass in the bladder likely reflecting blood clot. No upper tract cause or definite bladder wall thickening, recommend urology workup. 2. Cholelithiasis. Electronically Signed   By: Monte Fantasia M.D.   On: 12/21/2018 07:16    Procedures Procedures (including critical care time)  Medications Ordered in ED Medications - No data to display   Initial Impression / Assessment and Plan / ED Course  I have reviewed the triage vital signs and the nursing notes.  Pertinent labs & imaging results that were available during my care of the patient were  reviewed by me and considered in my medical decision making (see chart for details).     Case was briefly discussed with urology.  Recommendation at that time was to irrigate the patient's bladder and then remove the catheter as this is likely causing more issues.  I would agree with this as well.  Patient was irrigated without difficulty.  Patient was irrigated to faint pink fluid.  Catheter was removed.  She feels well.  She has not urinated here in the emergency department but she does not want a wait any longer for urination.  She has no abdominal pain or sensation of needing to urinate at this time.  She will be discharged home.  She will follow-up with urology.  The cause of her hematuria will continue to need to be worked up by urology who will see her in the office tomorrow.  Patient encouraged to return to the ER as needed for new or worsening symptoms  Final Clinical Impressions(s) / ED Diagnoses   Final diagnoses:  Hematuria, unspecified type    ED Discharge Orders    None       Jola Schmidt, MD 12/22/18 1719

## 2018-12-22 NOTE — Telephone Encounter (Signed)
Called pt lvm for her to call back to the office. [place]

## 2018-12-22 NOTE — ED Notes (Signed)
Per [place], Md encourage patient to drink fluids and void. Patient given coffee, water, and sandwich.

## 2018-12-23 DIAGNOSIS — R310 Gross hematuria: Secondary | ICD-10-CM | POA: Diagnosis not present

## 2018-12-23 NOTE — Telephone Encounter (Signed)
Pt had to go back to er and was advised she had some clots in her bladder. Pt says she was also made and appt for Another U/S and ABI which she canceled due to not knowing out of pocket cost. Pt was advised to call her insurance and advised office if she can afford the out of pocket cost . If she can afford the imaging should we put the orders back in or advise the ordering office to do so. [place]

## 2018-12-24 ENCOUNTER — Encounter (HOSPITAL_COMMUNITY): Payer: Medicare HMO

## 2018-12-24 ENCOUNTER — Encounter: Payer: Medicare HMO | Admitting: Vascular Surgery

## 2018-12-28 ENCOUNTER — Telehealth: Payer: Self-pay | Admitting: Family Medicine

## 2018-12-28 NOTE — Telephone Encounter (Signed)
yes

## 2018-12-28 NOTE — Telephone Encounter (Signed)
Pam with Dr. Louis Meckel office called & pt is scheduled for bladder biopsy on 01/26/19 & they want to know if they can hold her Plavix for 5 days prior to surgery please call back at T# [phone]

## 2018-12-29 NOTE — Telephone Encounter (Signed)
lvm for pam and called pt to advise of ok kh

## 2018-12-30 ENCOUNTER — Other Ambulatory Visit: Payer: Self-pay | Admitting: Family Medicine

## 2018-12-30 DIAGNOSIS — I10 Essential (primary) hypertension: Secondary | ICD-10-CM

## 2019-01-11 ENCOUNTER — Telehealth: Payer: Self-pay | Admitting: Family Medicine

## 2019-01-11 DIAGNOSIS — R338 Other retention of urine: Secondary | ICD-10-CM | POA: Diagnosis not present

## 2019-01-11 DIAGNOSIS — R310 Gross hematuria: Secondary | ICD-10-CM | POA: Diagnosis not present

## 2019-01-11 NOTE — Telephone Encounter (Signed)
Pts husband called and states that dr bell from [facility] urology took her off of her her Plavix, she is still having blood in her urine, she is going to have surgery on march the 11th and then they will go from there, he just wanted to call and let you know that they took her off of that medicine, pt can be reached at [phone]

## 2019-01-21 ENCOUNTER — Encounter: Payer: Self-pay | Admitting: Family Medicine

## 2019-01-21 ENCOUNTER — Ambulatory Visit (INDEPENDENT_AMBULATORY_CARE_PROVIDER_SITE_OTHER): Payer: Medicare HMO | Admitting: Family Medicine

## 2019-01-21 VITALS — BP 118/70 | HR 88 | Temp 98.3°F | Wt 136.8 lb

## 2019-01-21 DIAGNOSIS — Z720 Tobacco use: Secondary | ICD-10-CM | POA: Diagnosis not present

## 2019-01-21 DIAGNOSIS — J0100 Acute maxillary sinusitis, unspecified: Secondary | ICD-10-CM | POA: Diagnosis not present

## 2019-01-21 MED ORDER — AZITHROMYCIN 500 MG PO TABS: 500.0000 mg | ORAL_TABLET | Freq: Every day | ORAL | 0 refills | Status: DC

## 2019-01-21 NOTE — Progress Notes (Signed)
   Subjective:    Patient ID: [name], female    DOB: 24-Apr-1946, 73 y.o.   MRN: [id number]  HPI She complains of a 2-day history of left-sided facial pressure as well as behind the left eye and fullness in the left ear with some aching.  She has a slight cough.  No nasal congestion, sore throat, fever or chills.  She does continue to smoke.  She also scheduled for bladder surgery within the next several days.  Review of Systems     Objective:   Physical Exam Alert and in no distress.  Nasal mucosa is normal.  Proximal tenderness over left maxillary sinus.  Tympanic membranes and canals are normal. Pharyngeal area is normal. Neck is supple without adenopathy or thyromegaly. Cardiac exam shows a regular sinus rhythm without murmurs or gallops. Lungs are clear to auscultation.        Assessment & Plan:  Acute non-recurrent maxillary sinusitis - Plan: azithromycin (ZITHROMAX) 500 MG tablet  Tobacco abuse I will give her the antibiotic to ensure that there is no major infection going on prior to her surgery.  Again discussed the need for her to quit smoking.

## 2019-01-26 ENCOUNTER — Other Ambulatory Visit: Payer: Self-pay | Admitting: Urology

## 2019-01-26 DIAGNOSIS — D494 Neoplasm of unspecified behavior of bladder: Secondary | ICD-10-CM | POA: Diagnosis not present

## 2019-01-26 DIAGNOSIS — C670 Malignant neoplasm of trigone of bladder: Secondary | ICD-10-CM | POA: Diagnosis not present

## 2019-01-26 DIAGNOSIS — R310 Gross hematuria: Secondary | ICD-10-CM | POA: Diagnosis not present

## 2019-02-08 DIAGNOSIS — C672 Malignant neoplasm of lateral wall of bladder: Secondary | ICD-10-CM | POA: Diagnosis not present

## 2019-02-19 ENCOUNTER — Other Ambulatory Visit: Payer: Self-pay | Admitting: Family Medicine

## 2019-02-21 ENCOUNTER — Telehealth: Payer: Self-pay | Admitting: Family Medicine

## 2019-02-21 NOTE — Telephone Encounter (Signed)
ok 

## 2019-02-21 NOTE — Telephone Encounter (Signed)
Pt called and states that she would like to take Geritol with Iron. She states she is feeling tired and thought that would help. Please advise pt at [phone].

## 2019-02-22 NOTE — Telephone Encounter (Signed)
Pt was advised [name] 

## 2019-03-17 ENCOUNTER — Encounter: Payer: Self-pay | Admitting: Family Medicine

## 2019-03-17 ENCOUNTER — Ambulatory Visit (INDEPENDENT_AMBULATORY_CARE_PROVIDER_SITE_OTHER): Payer: Medicare HMO | Admitting: Family Medicine

## 2019-03-17 ENCOUNTER — Other Ambulatory Visit: Payer: Self-pay

## 2019-03-17 VITALS — BP 126/70 | Temp 98.8°F | Wt 136.0 lb

## 2019-03-17 DIAGNOSIS — C679 Malignant neoplasm of bladder, unspecified: Secondary | ICD-10-CM | POA: Diagnosis not present

## 2019-03-17 DIAGNOSIS — M545 Low back pain, unspecified: Secondary | ICD-10-CM

## 2019-03-17 MED ORDER — HYDROCODONE-ACETAMINOPHEN 5-300 MG PO TABS: 1.0000 | ORAL_TABLET | Freq: Four times a day (QID) | ORAL | 0 refills | Status: DC | PRN

## 2019-03-17 NOTE — Progress Notes (Signed)
   Subjective:    Patient ID: RHODA WALDVOGEL, female    DOB: Sep 07, 1946, 73 y.o.   MRN: [id number]  HPI Documentation for virtual telephone encounter.  Documentation for virtual audio and video telecommunications through [employer] encounter:  Interactive audio and video telecommunications were attempted between this provider and patient, however she did not have access to video capability.  We continued and completed visit with audio only. The patient was located at home. The provider was located in the office. The patient did consent to this visit and is aware of possible charges through their insurance for this visit. The other persons participating in this telemedicine service were none. Time spent on call was 5 minutes and in review of previous records >10 minutes total.  This virtual service is not related to other E/M service within previous 7 days. She complains of a 2-day history of right-sided low back pain that is made worse with motion.  No numbness, tingling or weakness.  She also has been having difficulty with her bowel habits and blames this on her recent bladder procedure.  She apparently does have a history of bladder cancer.  She thinks that some of her back pain is actually bowel habit related.   Review of Systems     Objective:   Physical Exam Alert and in no distress otherwise not examined       Assessment & Plan:  Acute right-sided low back pain, unspecified whether sciatica present - Plan: HYDROcodone-Acetaminophen (VICODIN) 5-300 MG TABS  Malignant neoplasm of urinary bladder, unspecified site (HCC) The back pains seem to be mainly mechanical in nature and I will therefore recommend a short course of 800 mg 3 times daily of Advil as well as some Vicodin.  Presently she is on Plavix and we will need to be careful concerning ibuprofen. She is also to use MiraLAX on a regular basis.  She will call me next week to let me know how she is doing.

## 2019-03-18 ENCOUNTER — Telehealth: Payer: Self-pay | Admitting: Family Medicine

## 2019-03-18 NOTE — Telephone Encounter (Signed)
Pt called and is needing the Vicodin 5-300 resent in they do not have that they need it sent as the 5-325 please resend to [place], [place] AT [address] pt can be reached at [phone]

## 2019-03-21 MED ORDER — HYDROCODONE-ACETAMINOPHEN 5-325 MG PO TABS: 1.0000 | ORAL_TABLET | Freq: Four times a day (QID) | ORAL | 0 refills | Status: DC | PRN

## 2019-04-02 ENCOUNTER — Other Ambulatory Visit: Payer: Self-pay | Admitting: Family Medicine

## 2019-04-02 DIAGNOSIS — I10 Essential (primary) hypertension: Secondary | ICD-10-CM

## 2019-04-02 DIAGNOSIS — I739 Peripheral vascular disease, unspecified: Secondary | ICD-10-CM

## 2019-04-02 DIAGNOSIS — I70213 Atherosclerosis of native arteries of extremities with intermittent claudication, bilateral legs: Secondary | ICD-10-CM

## 2019-05-06 DIAGNOSIS — R310 Gross hematuria: Secondary | ICD-10-CM | POA: Diagnosis not present

## 2019-05-06 DIAGNOSIS — C672 Malignant neoplasm of lateral wall of bladder: Secondary | ICD-10-CM | POA: Diagnosis not present

## 2019-05-09 ENCOUNTER — Ambulatory Visit (INDEPENDENT_AMBULATORY_CARE_PROVIDER_SITE_OTHER): Payer: Medicare HMO | Admitting: Family Medicine

## 2019-05-09 ENCOUNTER — Encounter: Payer: Self-pay | Admitting: Family Medicine

## 2019-05-09 ENCOUNTER — Other Ambulatory Visit: Payer: Self-pay

## 2019-05-09 VITALS — BP 110/68 | HR 98 | Temp 97.6°F | Wt 133.6 lb

## 2019-05-09 DIAGNOSIS — K602 Anal fissure, unspecified: Secondary | ICD-10-CM | POA: Diagnosis not present

## 2019-05-09 DIAGNOSIS — K625 Hemorrhage of anus and rectum: Secondary | ICD-10-CM

## 2019-05-09 DIAGNOSIS — C679 Malignant neoplasm of bladder, unspecified: Secondary | ICD-10-CM | POA: Diagnosis not present

## 2019-05-09 NOTE — Progress Notes (Signed)
   Subjective:    Patient ID: [name], female    DOB: Oct 17, 1946, 73 y.o.   MRN: [id number]  HPI 3 days ago after a BM she noted blood on the toilet paper.  Every day since then she has again noted blood.  No lower abdominal or rectal pain.  No lesions were palpable by her.  She did have a colonoscopy in 2018.  She also was recently seen by urology and does have bladder cancer.  She was seen in follow-up after having surgery.   Review of Systems     Objective:   Physical Exam Alert and in no distress.  Exam of the anal area does show a fissure at 2 o'clock position.  It is nontender to palpation and did have blood on it.  Digital rectal exam showed old blood.  Questionable fullness was noted at the 3 o'clock position.       Assessment & Plan:  Rectal bleeding - Plan: Ambulatory referral to Gastroenterology, could be internal hemorrhoids however there is extensive amount of blood so an anoscopy on my part would be probably difficult to do.  Malignant neoplasm of urinary bladder, unspecified site (HCC) - Transitional cell -   Anal fissure - Plan: Ambulatory referral to Gastroenterology, .

## 2019-05-10 DIAGNOSIS — K5730 Diverticulosis of large intestine without perforation or abscess without bleeding: Secondary | ICD-10-CM | POA: Diagnosis not present

## 2019-05-10 DIAGNOSIS — K5904 Chronic idiopathic constipation: Secondary | ICD-10-CM | POA: Diagnosis not present

## 2019-05-10 DIAGNOSIS — K625 Hemorrhage of anus and rectum: Secondary | ICD-10-CM | POA: Diagnosis not present

## 2019-05-10 DIAGNOSIS — D509 Iron deficiency anemia, unspecified: Secondary | ICD-10-CM | POA: Diagnosis not present

## 2019-05-11 ENCOUNTER — Telehealth: Payer: Self-pay | Admitting: Family Medicine

## 2019-05-11 NOTE — Telephone Encounter (Signed)
Get her in next week for a blood count

## 2019-05-11 NOTE — Telephone Encounter (Signed)
Pt called and states that GI doc called and states he blood count is a little over 8. They recommended she go to the hospital. She declined. They stopped her blood thinner. She is to hear from them for additional info today. She wanted to make sure you were aware of what is going on. Pt can be reached at [phone].

## 2019-05-12 ENCOUNTER — Emergency Department (HOSPITAL_COMMUNITY)
Admission: EM | Admit: 2019-05-12 | Discharge: 2019-05-12 | Disposition: A | Discharge status: Home / Self Care | Payer: Medicare HMO | Attending: Emergency Medicine | Admitting: Emergency Medicine

## 2019-05-12 ENCOUNTER — Encounter (HOSPITAL_COMMUNITY): Payer: Self-pay

## 2019-05-12 ENCOUNTER — Other Ambulatory Visit: Payer: Self-pay

## 2019-05-12 DIAGNOSIS — K5904 Chronic idiopathic constipation: Secondary | ICD-10-CM | POA: Diagnosis not present

## 2019-05-12 DIAGNOSIS — Z20828 Contact with and (suspected) exposure to other viral communicable diseases: Secondary | ICD-10-CM | POA: Insufficient documentation

## 2019-05-12 DIAGNOSIS — Z8673 Personal history of transient ischemic attack (TIA), and cerebral infarction without residual deficits: Secondary | ICD-10-CM | POA: Diagnosis not present

## 2019-05-12 DIAGNOSIS — D509 Iron deficiency anemia, unspecified: Secondary | ICD-10-CM | POA: Diagnosis not present

## 2019-05-12 DIAGNOSIS — Z7982 Long term (current) use of aspirin: Secondary | ICD-10-CM | POA: Insufficient documentation

## 2019-05-12 DIAGNOSIS — I10 Essential (primary) hypertension: Secondary | ICD-10-CM | POA: Insufficient documentation

## 2019-05-12 DIAGNOSIS — Z853 Personal history of malignant neoplasm of breast: Secondary | ICD-10-CM | POA: Insufficient documentation

## 2019-05-12 DIAGNOSIS — K922 Gastrointestinal hemorrhage, unspecified: Secondary | ICD-10-CM | POA: Diagnosis not present

## 2019-05-12 DIAGNOSIS — K5730 Diverticulosis of large intestine without perforation or abscess without bleeding: Secondary | ICD-10-CM | POA: Diagnosis not present

## 2019-05-12 DIAGNOSIS — Z7901 Long term (current) use of anticoagulants: Secondary | ICD-10-CM | POA: Insufficient documentation

## 2019-05-12 DIAGNOSIS — F17210 Nicotine dependence, cigarettes, uncomplicated: Secondary | ICD-10-CM | POA: Insufficient documentation

## 2019-05-12 DIAGNOSIS — K625 Hemorrhage of anus and rectum: Secondary | ICD-10-CM | POA: Diagnosis not present

## 2019-05-12 DIAGNOSIS — Z03818 Encounter for observation for suspected exposure to other biological agents ruled out: Secondary | ICD-10-CM | POA: Diagnosis not present

## 2019-05-12 DIAGNOSIS — Z79899 Other long term (current) drug therapy: Secondary | ICD-10-CM | POA: Diagnosis not present

## 2019-05-12 DIAGNOSIS — K921 Melena: Secondary | ICD-10-CM | POA: Diagnosis present

## 2019-05-12 LAB — COMPREHENSIVE METABOLIC PANEL
ALT: 14 U/L (ref 0–44)
AST: 27 U/L (ref 15–41)
Albumin: 3.6 g/dL (ref 3.5–5.0)
Alkaline Phosphatase: 104 U/L (ref 38–126)
Anion gap: 13 (ref 5–15)
BUN: 11 mg/dL (ref 8–23)
CO2: 27 mmol/L (ref 22–32)
Calcium: 9.3 mg/dL (ref 8.9–10.3)
Chloride: 98 mmol/L (ref 98–111)
Creatinine, Ser: 0.88 mg/dL (ref 0.44–1.00)
GFR calc Af Amer: >60 mL/min (ref >60)
GFR calc non Af Amer: >60 mL/min (ref >60)
Glucose, Bld: 143 mg/dL — ABNORMAL HIGH (ref 70–99)
Potassium: 3.8 mmol/L (ref 3.5–5.1)
Sodium: 138 mmol/L (ref 135–145)
Total Bilirubin: 0.2 mg/dL — ABNORMAL LOW (ref 0.3–1.2)
Total Protein: 7.4 g/dL (ref 6.5–8.1)

## 2019-05-12 LAB — CBC
HCT: 29.3 % — ABNORMAL LOW (ref 36.0–46.0)
Hemoglobin: 9 g/dL — ABNORMAL LOW (ref 12.0–15.0)
MCH: 26.5 pg (ref 26.0–34.0)
MCHC: 30.7 g/dL (ref 30.0–36.0)
MCV: 86.2 fL (ref 80.0–100.0)
Platelets: 248 10*3/uL (ref 150–400)
RBC: 3.4 MIL/uL — ABNORMAL LOW (ref 3.87–5.11)
RDW: 21 % — ABNORMAL HIGH (ref 11.5–15.5)
WBC: 6.5 10*3/uL (ref 4.0–10.5)
nRBC: 0 % (ref 0.0–0.2)

## 2019-05-12 LAB — TYPE AND SCREEN
ABO/RH(D): B POS
Antibody Screen: NEGATIVE

## 2019-05-12 LAB — POC OCCULT BLOOD, ED: Fecal Occult Bld: POSITIVE — AB

## 2019-05-12 LAB — ABO/RH: ABO/RH(D): B POS

## 2019-05-12 LAB — SARS CORONAVIRUS 2 BY RT PCR (HOSPITAL ORDER, PERFORMED IN ~~LOC~~ HOSPITAL LAB): SARS Coronavirus 2: NEGATIVE

## 2019-05-12 MED ORDER — SODIUM CHLORIDE 0.9 % IV BOLUS
500.0000 mL | Freq: Once | INTRAVENOUS | Status: AC
  Administered 2019-05-12: 500 mL via INTRAVENOUS

## 2019-05-12 MED ORDER — SODIUM CHLORIDE 0.9 % IV BOLUS
1000.0000 mL | Freq: Once | INTRAVENOUS | Status: AC
  Administered 2019-05-12: 1000 mL via INTRAVENOUS

## 2019-05-12 NOTE — Discharge Instructions (Signed)
Your rectal bleeding, appears to have stabilized.  This means that it is not actively bleeding, and you are not in danger.  Hemoglobin today is 9 which is stable, unchanged, since Tuesday.  Dr. Collene Mares, will contact you tomorrow to arrange for further evaluation and treatment.  You will be able to help you, from her office.  If you get worse, have other concerns or want to be checked again you can always return here at any time.

## 2019-05-12 NOTE — Telephone Encounter (Signed)
Called pt to make an appt for her to have a blood count. No answer left her a message to call office . [place]

## 2019-05-12 NOTE — ED Triage Notes (Signed)
Pt sent by Dr Collene Mares. Pt reports "lots" of rectal bleeding - dark red recently- since Friday. Pt was taken off of plavix on Monday. Pt states she has been feeling weak.

## 2019-05-12 NOTE — ED Notes (Signed)
Per DR Collene Mares, patient coming to ED for rectal bleeding-patient is on Plavix-GI MD wants admission

## 2019-05-12 NOTE — ED Notes (Signed)
POC Occult blood- POSITIVE

## 2019-05-12 NOTE — ED Provider Notes (Signed)
[facility] DEPT Provider Note   CSN: [id number] Arrival date & time: 05/12/19  1243    History   Chief Complaint Chief Complaint  Patient presents with  . GI Bleeding    HPI [name] is a 73 y.o. female.     HPI   She presents for evaluation of rectal bleeding, being managed as an outpatient by her GI physician.  When bleeding started 7 days ago is been persistent as dark stool.  She was evaluated by GI physician, 2 days ago and had blood work done that day, labs not immediately available.  She feels generally weak and dizzy with ambulation.  She denies chest pain or dyspnea.  She has had decreased appetite but denies abdominal or back pain.  No prior similar problems.  She had a recent bladder procedure but does not have any current hematuria.  She denies fever, cough, chills, weakness.  There are no other known modifying factors.  Past Medical History:  Diagnosis Date  . Abdominal wall hernia   . Blood transfusion    30+ yrs. ago  . Cancer (HCC)    Right Breast  . Hyperlipidemia   . Hypertension   . Peripheral vascular disease ([place])   . Seasonal allergies   . TIA (transient ischemic attack)     Patient Active Problem List   Diagnosis Date Noted  . Cataract 12/09/2018  . TIA (transient ischemic attack) 01/07/2013  . Carotid stenosis, asymptomatic 01/07/2013  . Atherosclerosis of native artery of extremity with intermittent claudication ([place]) 05/12/2012  . Hypertension   . Tobacco abuse 12/03/2011  . PVD (peripheral vascular disease) ([place]) 08/13/2011  . History of breast cancer 08/13/2011  . Allergic rhinitis due to pollen 08/13/2011    Past Surgical History:  Procedure Laterality Date  . ABDOMINAL HYSTERECTOMY    . Aortobifemoral BPG  05/15/11   Using a 14 x8 bifurcated Dacron Graft  . BREAST SURGERY     right lumpectomy  . BREAST SURGERY     multiple breast surgeries  . COLONOSCOPY  2012   [facility]  . DILATION AND  CURETTAGE OF UTERUS     3-4 in late 20"s  . NM MYOCAR PERF WALL MOTION  03/11/2011   mild to moderate perfusion defect in the apical and apical lateral region c/w an infarct/scar  . VASCULAR SURGERY  05/15/11   aobifemoral bypass graft  . VENTRAL HERNIA REPAIR  12/26/2011   Procedure: LAPAROSCOPIC VENTRAL HERNIA;  Surgeon: Adin Hector, MD;  Location: [facility];  Service: General;  Laterality: N/A;  With Mesh     OB History   No obstetric history on file.      Home Medications    Prior to Admission medications   Medication Sig Start Date End Date Taking? Authorizing Provider  aspirin EC 81 MG tablet Take 81 mg by mouth every evening.    Yes [provider]  atorvastatin (LIPITOR) 20 MG tablet TAKE 1 TABLET BY MOUTH ONCE DAILY Patient taking differently: Take 20 mg by mouth every evening.  04/04/19  Yes Denita Lung, MD  cholecalciferol (VITAMIN D) 1000 units tablet Take 1,000 Units by mouth every evening.    Yes [provider]  hydrochlorothiazide (HYDRODIURIL) 25 MG tablet TAKE 1 TABLET BY MOUTH EVERY DAY Patient taking differently: Take 25 mg by mouth every evening.  04/04/19  Yes Denita Lung, MD  hydroxypropyl methylcellulose / hypromellose (ISOPTO TEARS / GONIOVISC) 2.5 % ophthalmic solution Place  1 drop into both eyes daily as needed for dry eyes.   Yes [provider]  triamcinolone cream (KENALOG) 0.1 % Apply 1 application topically 2 (two) times daily as needed (rash).    Yes [provider]  azithromycin (ZITHROMAX) 500 MG tablet Take 1 tablet (500 mg total) by mouth daily. Patient not taking: Reported on 03/17/2019 01/21/19   Denita Lung, MD  clopidogrel (PLAVIX) 75 MG tablet TAKE 1 TABLET BY MOUTH EVERY DAY Patient not taking: Reported on 05/12/2019 02/21/19   Denita Lung, MD  HYDROcodone-acetaminophen (NORCO) 5-325 MG tablet Take 1 tablet by mouth every 6 (six) hours as needed for moderate pain. Patient not taking: Reported on  05/09/2019 03/21/19   Denita Lung, MD  oxyCODONE-acetaminophen (PERCOCET) 5-325 MG tablet Take 1 tablet by mouth every 6 (six) hours as needed. Patient not taking: Reported on 01/21/2019 12/21/18   Mesner, Corene Cornea, MD  potassium chloride (K-DUR) 10 MEQ tablet Take 2 tablets (20 mEq total) by mouth 2 (two) times daily. 12/19/11 01/27/12  Michael Boston, MD    Family History Family History  Problem Relation Age of Onset  . Heart attack Sister   . Heart attack Brother   . Stroke Sister   . Cancer Mother        BREAST, BRAIN  . Heart disease Mother   . Cancer Paternal Aunt        breast ca  . Cancer Paternal Grandmother        unknown ca    Social History Social History   Tobacco Use  . Smoking status: Current Every Day Smoker    Packs/day: 0.50    Years: 30.00    Pack years: 15.00    Types: Cigarettes  . Smokeless tobacco: Never Used  . Tobacco comment: pt states that she uses the smokeless cig and is trying to quit  Substance Use Topics  . Alcohol use: No    Comment: occasional alcohol intake  . Drug use: No     Allergies   Latex   Review of Systems Review of Systems  All other systems reviewed and are negative.    Physical Exam Updated Vital Signs BP 109/64   Pulse 82   Temp 99 F (37.2 C) (Oral)   Resp 16   Wt 60.6 kg   SpO2 91%   BMI 24.05 kg/m   Physical Exam Vitals signs and nursing note reviewed.  Constitutional:      General: She is not in acute distress.    Appearance: She is well-developed. She is not ill-appearing, toxic-appearing or diaphoretic.  HENT:     Head: Normocephalic and atraumatic.     Right Ear: External ear normal.     Left Ear: External ear normal.  Eyes:     Conjunctiva/sclera: Conjunctivae normal.     Pupils: Pupils are equal, round, and reactive to light.  Neck:     Musculoskeletal: Normal range of motion and neck supple.     Trachea: Phonation normal.  Cardiovascular:     Rate and Rhythm: Normal rate and regular rhythm.      Heart sounds: Normal heart sounds.  Pulmonary:     Effort: Pulmonary effort is normal.     Breath sounds: Normal breath sounds.  Abdominal:     Palpations: Abdomen is soft.     Tenderness: There is no abdominal tenderness.  Genitourinary:    Comments: Dark, almost tarry thick stool, in the rectum. Musculoskeletal: Normal range of motion.  Skin:    General: Skin is warm and dry.  Neurological:     Mental Status: She is alert and oriented to person, place, and time.     Cranial Nerves: No cranial nerve deficit.     Sensory: No sensory deficit.     Motor: No abnormal muscle tone.     Coordination: Coordination normal.  Psychiatric:        Mood and Affect: Mood normal.        Behavior: Behavior normal.        Thought Content: Thought content normal.        Judgment: Judgment normal.      ED Treatments / Results  Labs (all labs ordered are listed, but only abnormal results are displayed) Labs Reviewed  COMPREHENSIVE METABOLIC PANEL - Abnormal; Notable for the following components:      Result Value   Glucose, Bld 143 (*)    Total Bilirubin 0.2 (*)    All other components within normal limits  CBC - Abnormal; Notable for the following components:   RBC 3.40 (*)    Hemoglobin 9.0 (*)    HCT 29.3 (*)    RDW 21.0 (*)    All other components within normal limits  POC OCCULT BLOOD, ED - Abnormal; Notable for the following components:   Fecal Occult Bld POSITIVE (*)    All other components within normal limits  SARS CORONAVIRUS 2 (HOSPITAL ORDER, [place] [facility])  TYPE AND SCREEN  ABO/RH    EKG None  Radiology No results found.  Procedures Procedures (including critical care time)  Medications Ordered in ED Medications  sodium chloride 0.9 % bolus 500 mL (0 mLs Intravenous Stopped 05/12/19 1509)  sodium chloride 0.9 % bolus 1,000 mL (1,000 mLs Intravenous New Bag/Given 05/12/19 1648)     Initial Impression / Assessment and Plan / ED Course   I have reviewed the triage vital signs and the nursing notes.  Pertinent labs & imaging results that were available during my care of the patient were reviewed by me and considered in my medical decision making (see chart for details).  Clinical Course as of May 12 1847  Thu May 12, 2019  1542 Normal  SARS Coronavirus 2 (CEPHEID - Performed in [place] lab), [facility] Order [EW]  [phone] Normal except glucose high  Comprehensive metabolic panel(!) [EW]  3646 I discussed the case with her gastroenterologist who saw her in the office today.  She states that the hemoglobin was in the range of 8, 2 days ago.  She advises to treat the patient with IV fluids, then discharge.  She will follow-up with the patient and arrange for further care and treatment.   [EW]  1837 Abnormal, blood present  POC occult blood, ED(!) [EW]  1837 Abnormal, hemoglobin low  CBC(!) [EW]    Clinical Course User Index [EW] Daleen Bo, MD        Patient Vitals for the past 24 hrs:  BP Temp Temp src Pulse Resp SpO2 Weight  05/12/19 1808 109/64 - - 82 - 91 % -  05/12/19 1759 121/62 - - 74 16 100 % -  05/12/19 1730 121/62 - - 75 - 100 % -  05/12/19 1702 122/63 - - 80 18 100 % -  05/12/19 1700 122/63 - - 82 - 100 % -  05/12/19 1630 (!) 106/59 - - 76 - 99 % -  05/12/19 1600 (!) 90/55 - - 72 - 100 % -  05/12/19 1530 110/65 - - 77 - 100 % -  05/12/19 1500 (!) 102/54 - - 76 - 100 % -  05/12/19 1430 114/61 - - 77 - 100 % -  05/12/19 1400 109/60 - - 84 18 100 % -  05/12/19 1330 116/65 - - 84 - 100 % -  05/12/19 1318 123/64 - - 84 - 100 % -  05/12/19 1256 - - - - - - 60.6 kg  05/12/19 1255 115/70 99 F (37.2 C) Oral 84 14 100 % -    6:38 PM Reevaluation with update and discussion. After initial assessment and treatment, an updated evaluation reveals no change in clinical status.  Findings discussed with the patient and all questions were answered. Daleen Bo   Medical Decision Making: Rectal bleeding,  she is hemodynamically stable.  She is being followed closely by gastroenterology who is aware of her current condition and plans on following up as an outpatient to manage her GI bleeding.  She has not had any Plavix, for 4 days, and is likely to have significant remaining blood levels Plavix.  Doubt impending vascular collapse.  CRITICAL CARE-no Performed by: Daleen Bo   Nursing Notes Reviewed/ Care Coordinated Applicable Imaging Reviewed Interpretation of Laboratory Data incorporated into ED treatment  The patient appears reasonably screened and/or stabilized for discharge and I doubt any other medical condition or other [facility] requiring further screening, evaluation, or treatment in the ED at this time prior to discharge.  Plan: Home Medications-continue usual with the exception of Plavix; Home Treatments-drink plenty of fluids; return here if the recommended treatment, does not improve the symptoms; Recommended follow up-GI follow-up as planned.    Final Clinical Impressions(s) / ED Diagnoses   Final diagnoses:  Gastrointestinal hemorrhage, unspecified gastrointestinal hemorrhage type    ED Discharge Orders    None       Daleen Bo, MD 05/12/19 [phone]

## 2019-05-13 NOTE — Telephone Encounter (Signed)
Pt says when we sent her to Dr. Collene Mares yesterday she had blood work done at [facility] . Pt was stopped on her blood thinner yesterday and has to see Dr. Collene Mares again next week.Per pt labs were normal. [facility]

## 2019-05-17 DIAGNOSIS — Z1211 Encounter for screening for malignant neoplasm of colon: Secondary | ICD-10-CM | POA: Diagnosis not present

## 2019-05-17 DIAGNOSIS — K5730 Diverticulosis of large intestine without perforation or abscess without bleeding: Secondary | ICD-10-CM | POA: Diagnosis not present

## 2019-05-17 DIAGNOSIS — K5904 Chronic idiopathic constipation: Secondary | ICD-10-CM | POA: Diagnosis not present

## 2019-05-17 DIAGNOSIS — D509 Iron deficiency anemia, unspecified: Secondary | ICD-10-CM | POA: Diagnosis not present

## 2019-05-18 ENCOUNTER — Other Ambulatory Visit: Payer: Self-pay | Admitting: Urology

## 2019-05-23 DIAGNOSIS — K635 Polyp of colon: Secondary | ICD-10-CM | POA: Diagnosis not present

## 2019-05-23 DIAGNOSIS — Z1211 Encounter for screening for malignant neoplasm of colon: Secondary | ICD-10-CM | POA: Diagnosis not present

## 2019-05-23 DIAGNOSIS — D122 Benign neoplasm of ascending colon: Secondary | ICD-10-CM | POA: Diagnosis not present

## 2019-05-23 DIAGNOSIS — D509 Iron deficiency anemia, unspecified: Secondary | ICD-10-CM | POA: Diagnosis not present

## 2019-05-23 DIAGNOSIS — K625 Hemorrhage of anus and rectum: Secondary | ICD-10-CM | POA: Diagnosis not present

## 2019-05-23 LAB — HM COLONOSCOPY

## 2019-05-24 ENCOUNTER — Other Ambulatory Visit (HOSPITAL_COMMUNITY)
Admission: RE | Admit: 2019-05-24 | Discharge: 2019-05-24 | Disposition: A | Discharge status: Home / Self Care | Payer: Medicare HMO | Source: Ambulatory Visit | Attending: Urology | Admitting: Urology

## 2019-05-24 DIAGNOSIS — Z1159 Encounter for screening for other viral diseases: Secondary | ICD-10-CM | POA: Diagnosis not present

## 2019-05-24 DIAGNOSIS — K5790 Diverticulosis of intestine, part unspecified, without perforation or abscess without bleeding: Secondary | ICD-10-CM

## 2019-05-24 DIAGNOSIS — Z01812 Encounter for preprocedural laboratory examination: Secondary | ICD-10-CM | POA: Insufficient documentation

## 2019-05-24 LAB — SARS CORONAVIRUS 2 (TAT 6-24 HRS): SARS Coronavirus 2: NEGATIVE

## 2019-05-24 LAB — HM COLONOSCOPY

## 2019-05-24 NOTE — Patient Instructions (Addendum)
YOU  HAD  A COVID 19 TEST ON 05-24-2019,  SINCE YOUR COVID TEST IS COMPLETED, PLEASE BEGIN THE QUARANTINE INSTRUCTIONS AS OUTLINED IN YOUR HANDOUT.                [name]    Your procedure is scheduled on: 05-27-2019   Report to [facility] Main  Entrance    Report to [name] at 530  AM    Call this number if you have problems the morning of surgery [phone]    Remember: Do not eat food or drink liquids :After Midnight.     Take these medicines the morning of surgery with A SIP OF WATER: None. You may use and bring your eyedrops as needed    BRUSH YOUR TEETH MORNING OF SURGERY AND RINSE YOUR MOUTH OUT, NO CHEWING GUM CANDY OR MINTS.                               You may not have any metal on your body including hair pins and              piercings     Do not wear jewelry, make-up, lotions, powders or perfumes, deodorant               Do not wear nail polish.  Do not shave  48 hours prior to surgery.                 Do not bring valuables to the hospital. [name].  Contacts, dentures or bridgework may not be worn into surgery.    Patients discharged the day of surgery will not be allowed to drive home. IF YOU ARE HAVING SURGERY AND GOING HOME THE SAME DAY, YOU MUST HAVE AN ADULT TO DRIVE YOU HOME AND BE WITH YOU FOR 24 HOURS. YOU MAY GO HOME BY TAXI OR UBER OR ORTHERWISE, BUT AN ADULT MUST ACCOMPANY YOU HOME AND STAY WITH YOU FOR 24 HOURS.  Name and phone number of your driver: [name] ( Pt to provide contact number on day of the procedure)                Please read over the following fact sheets you were given: _____________________________________________________________________             [facility] - Preparing for Surgery Before surgery, you can play an important role.  Because skin is not sterile, your skin needs to be as free of germs as possible.  You can reduce the number of germs on  your skin by washing with CHG (chlorahexidine gluconate) soap before surgery.  CHG is an antiseptic cleaner which kills germs and bonds with the skin to continue killing germs even after washing. Please DO NOT use if you have an allergy to CHG or antibacterial soaps.  If your skin becomes reddened/irritated stop using the CHG and inform your nurse when you arrive at Short Stay. Do not shave (including legs and underarms) for at least 48 hours prior to the first CHG shower.  You may shave your face/neck. Please follow these instructions carefully:  1.  Shower with CHG Soap the night before surgery and the  morning of Surgery.  2.  If you choose to wash your hair, wash your hair first as usual with  your  normal  shampoo.  3.  After you shampoo, rinse your hair and body thoroughly to remove the  shampoo.                           4.  Use CHG as you would any other liquid soap.  You can apply chg directly  to the skin and wash                       Gently with a scrungie or clean washcloth.  5.  Apply the CHG Soap to your body ONLY FROM THE NECK DOWN.   Do not use on face/ open                           Wound or open sores. Avoid contact with eyes, ears mouth and genitals (private parts).                       Wash face,  Genitals (private parts) with your normal soap.             6.  Wash thoroughly, paying special attention to the area where your surgery  will be performed.  7.  Thoroughly rinse your body with warm water from the neck down.  8.  DO NOT shower/wash with your normal soap after using and rinsing off  the CHG Soap.                9.  Pat yourself dry with a clean towel.            10.  Wear clean pajamas.            11.  Place clean sheets on your bed the night of your first shower and do not  sleep with pets. Day of Surgery : Do not apply any lotions/deodorants the morning of surgery.  Please wear clean clothes to the hospital/surgery center.  FAILURE TO FOLLOW THESE INSTRUCTIONS MAY  RESULT IN THE CANCELLATION OF YOUR SURGERY PATIENT SIGNATURE_________________________________  NURSE SIGNATURE__________________________________  ________________________________________________________________________

## 2019-05-25 ENCOUNTER — Other Ambulatory Visit: Payer: Self-pay

## 2019-05-25 ENCOUNTER — Encounter (HOSPITAL_COMMUNITY)
Admission: RE | Admit: 2019-05-25 | Discharge: 2019-05-25 | Disposition: A | Discharge status: Home / Self Care | Payer: Medicare HMO | Source: Ambulatory Visit | Attending: Urology | Admitting: Urology

## 2019-05-25 ENCOUNTER — Encounter (HOSPITAL_COMMUNITY): Payer: Self-pay

## 2019-05-25 DIAGNOSIS — D090 Carcinoma in situ of bladder: Secondary | ICD-10-CM | POA: Diagnosis not present

## 2019-05-25 DIAGNOSIS — Z8551 Personal history of malignant neoplasm of bladder: Secondary | ICD-10-CM | POA: Diagnosis not present

## 2019-05-25 DIAGNOSIS — F17210 Nicotine dependence, cigarettes, uncomplicated: Secondary | ICD-10-CM | POA: Diagnosis not present

## 2019-05-25 DIAGNOSIS — C679 Malignant neoplasm of bladder, unspecified: Secondary | ICD-10-CM | POA: Diagnosis present

## 2019-05-25 DIAGNOSIS — I10 Essential (primary) hypertension: Secondary | ICD-10-CM | POA: Diagnosis not present

## 2019-05-25 DIAGNOSIS — I739 Peripheral vascular disease, unspecified: Secondary | ICD-10-CM | POA: Diagnosis not present

## 2019-05-25 DIAGNOSIS — Z7982 Long term (current) use of aspirin: Secondary | ICD-10-CM | POA: Diagnosis not present

## 2019-05-25 DIAGNOSIS — Z853 Personal history of malignant neoplasm of breast: Secondary | ICD-10-CM | POA: Diagnosis not present

## 2019-05-25 DIAGNOSIS — Z79899 Other long term (current) drug therapy: Secondary | ICD-10-CM | POA: Diagnosis not present

## 2019-05-25 DIAGNOSIS — Z7952 Long term (current) use of systemic steroids: Secondary | ICD-10-CM | POA: Diagnosis not present

## 2019-05-25 DIAGNOSIS — Z8673 Personal history of transient ischemic attack (TIA), and cerebral infarction without residual deficits: Secondary | ICD-10-CM | POA: Diagnosis not present

## 2019-05-25 LAB — BASIC METABOLIC PANEL
Anion gap: 15 (ref 5–15)
BUN: 7 mg/dL — ABNORMAL LOW (ref 8–23)
CO2: 24 mmol/L (ref 22–32)
Calcium: 9.2 mg/dL (ref 8.9–10.3)
Chloride: 101 mmol/L (ref 98–111)
Creatinine, Ser: 0.71 mg/dL (ref 0.44–1.00)
GFR calc Af Amer: >60 mL/min (ref >60)
GFR calc non Af Amer: >60 mL/min (ref >60)
Glucose, Bld: 81 mg/dL (ref 70–99)
Potassium: 3.4 mmol/L — ABNORMAL LOW (ref 3.5–5.1)
Sodium: 140 mmol/L (ref 135–145)

## 2019-05-25 LAB — CBC
HCT: 28.1 % — ABNORMAL LOW (ref 36.0–46.0)
Hemoglobin: 8.2 g/dL — ABNORMAL LOW (ref 12.0–15.0)
MCH: 25.4 pg — ABNORMAL LOW (ref 26.0–34.0)
MCHC: 29.2 g/dL — ABNORMAL LOW (ref 30.0–36.0)
MCV: 87 fL (ref 80.0–100.0)
Platelets: 280 10*3/uL (ref 150–400)
RBC: 3.23 MIL/uL — ABNORMAL LOW (ref 3.87–5.11)
RDW: 19.7 % — ABNORMAL HIGH (ref 11.5–15.5)
WBC: 5.7 10*3/uL (ref 4.0–10.5)
nRBC: 0 % (ref 0.0–0.2)

## 2019-05-26 NOTE — Progress Notes (Signed)
Anesthesia Chart Review   Case: [id number] Date/Time: 05/27/19 0715   Procedures:      CYSTOSCOPY WITH BLADDER  BIOPSY (N/A )     CYSTOSCOPY WITH BILATERAL RETROGRADE PYELOGRAM (Bilateral )   Anesthesia type: General   Pre-op diagnosis: BLADDER CANCER   Location[facility] ROOM 08 / [facility]   Surgeon: [name], MD      DISCUSSION:73 y.o. current every day smoker (15 pack years) with h/o HTN, HLD, PVD, right breast cancer, bladder cancer scheduled for above procedure 05/27/2019 with Dr. Louis Meckel.   Pt seen in ED 05/12/2019 due to rectal bleeding, hemodynamically stable, she is being followed closely by GI who is aware of GI bleed.  She has been holding Plavix.  Hemoglobin 8.2 at PAT visit, forwarded to PCP and Surgeon.   VS: BP 122/74 (BP Location: Right Arm)   Pulse 71   Temp 36.9 C (Oral)   Resp 18   Ht 5\' 3"  (1.6 m)   Wt 60.9 kg   SpO2 100%   BMI 23.77 kg/m   PROVIDERS: Denita Lung, MD is PCP    LABS: Labs reviewed: Acceptable for surgery. (all labs ordered are listed, but only abnormal results are displayed)  Labs Reviewed  BASIC METABOLIC PANEL - Abnormal; Notable for the following components:      Result Value   Potassium 3.4 (*)    BUN 7 (*)    All other components within normal limits  CBC - Abnormal; Notable for the following components:   RBC 3.23 (*)    Hemoglobin 8.2 (*)    HCT 28.1 (*)    MCH 25.4 (*)    MCHC 29.2 (*)    RDW 19.7 (*)    All other components within normal limits     IMAGES:   EKG: 05/25/2019 Rate 70 bpm Normal sinus rhythm   CV: Myocardial Perfusion 07/11/15 A mild to moderate perfusion defect is seen in the Apical and Apical Lateral regions.  This is consistent with an infarct/scar.  There is no scintigraphic evidence of inducible myocardial ischemia.  The post-stress ejection fraction is 66%.  Mild apical lateral hypocontractility.  Pharmacological stress test without chest pain or EKG changes for ischemia This  is a low risk scan.  Clinical correlation recommended.  No prior study available for comparison.  Abnormal Myocardial perfusion study.   Past Medical History:  Diagnosis Date  . Abdominal wall hernia   . Blood transfusion    30+ yrs. ago  . Cancer (HCC)    Right Breast  . Hyperlipidemia   . Hypertension   . Peripheral vascular disease ([place])   . Seasonal allergies   . TIA (transient ischemic attack)     Past Surgical History:  Procedure Laterality Date  . ABDOMINAL HYSTERECTOMY    . Aortobifemoral BPG  05/15/11   Using a 14 x8 bifurcated Dacron Graft  . BREAST SURGERY     right lumpectomy  . BREAST SURGERY     multiple breast surgeries  . CATARACT EXTRACTION Right 10/2018  . COLONOSCOPY  2012   [facility]  . DILATION AND CURETTAGE OF UTERUS     3-4 in late 20"s  . HERNIA REPAIR    . NM MYOCAR PERF WALL MOTION  03/11/2011   mild to moderate perfusion defect in the apical and apical lateral region c/w an infarct/scar  . VASCULAR SURGERY  05/15/11   aobifemoral bypass graft  . VENTRAL HERNIA REPAIR  12/26/2011   Procedure:  LAPAROSCOPIC VENTRAL HERNIA;  Surgeon: Adin Hector, MD;  Location: [facility];  Service: General;  Laterality: N/A;  With Mesh    MEDICATIONS: . aspirin EC 81 MG tablet  . atorvastatin (LIPITOR) 20 MG tablet  . azithromycin (ZITHROMAX) 500 MG tablet  . cholecalciferol (VITAMIN D) 1000 units tablet  . clopidogrel (PLAVIX) 75 MG tablet  . hydrochlorothiazide (HYDRODIURIL) 25 MG tablet  . HYDROcodone-acetaminophen (NORCO) 5-325 MG tablet  . hydroxypropyl methylcellulose / hypromellose (ISOPTO TEARS / GONIOVISC) 2.5 % ophthalmic solution  . oxyCODONE-acetaminophen (PERCOCET) 5-325 MG tablet  . triamcinolone cream (KENALOG) 0.1 %   No current facility-administered medications for this encounter.      Maia Plan [facility] Pre-Surgical Testing [phone] 05/26/19  2:03 PM

## 2019-05-26 NOTE — Anesthesia Preprocedure Evaluation (Addendum)
Anesthesia Evaluation  Patient identified by MRN, date of birth, ID band Patient awake    Reviewed: Allergy & Precautions, NPO status , Patient's Chart, lab work & pertinent test results  History of Anesthesia Complications Negative for: history of anesthetic complications  Airway Mallampati: II  TM Distance: >3 FB Neck ROM: Full    Dental  (+) Dental Advisory Given   Pulmonary Current Smoker,    breath sounds clear to auscultation       Cardiovascular hypertension, Pt. on medications + Peripheral Vascular Disease   Rhythm:Regular     Neuro/Psych neg Seizures TIAnegative psych ROS   GI/Hepatic negative GI ROS, Neg liver ROS,   Endo/Other  negative endocrine ROS  Renal/GU negative Renal ROS     Musculoskeletal negative musculoskeletal ROS (+)   Abdominal   Peds  Hematology  (+) anemia ,   Anesthesia Other Findings   Reproductive/Obstetrics                            Anesthesia Physical Anesthesia Plan  ASA: II  Anesthesia Plan: General   Post-op Pain Management:    Induction: Intravenous  PONV Risk Score and Plan: 2 and Ondansetron  Airway Management Planned: Oral ETT and LMA  Additional Equipment: None  Intra-op Plan:   Post-operative Plan: Extubation in OR  Informed Consent: I have reviewed the patients History and Physical, chart, labs and discussed the procedure including the risks, benefits and alternatives for the proposed anesthesia with the patient or authorized representative who has indicated his/her understanding and acceptance.     Dental advisory given  Plan Discussed with: CRNA and Surgeon  Anesthesia Plan Comments: (See PAT note 05/25/2019, Konrad Felix, PA-C)       Anesthesia Quick Evaluation

## 2019-05-27 ENCOUNTER — Ambulatory Visit (HOSPITAL_COMMUNITY): Payer: Medicare HMO | Admitting: Physician Assistant

## 2019-05-27 ENCOUNTER — Encounter (HOSPITAL_COMMUNITY): Payer: Self-pay | Admitting: Emergency Medicine

## 2019-05-27 ENCOUNTER — Encounter (HOSPITAL_COMMUNITY)
Admission: RE | Disposition: A | Discharge status: Home / Self Care | Payer: Self-pay | Source: Home / Self Care | Attending: Urology

## 2019-05-27 ENCOUNTER — Other Ambulatory Visit: Payer: Self-pay

## 2019-05-27 ENCOUNTER — Ambulatory Visit (HOSPITAL_COMMUNITY): Payer: Medicare HMO

## 2019-05-27 ENCOUNTER — Ambulatory Visit (HOSPITAL_COMMUNITY): Payer: Medicare HMO | Admitting: Registered Nurse

## 2019-05-27 ENCOUNTER — Ambulatory Visit (HOSPITAL_COMMUNITY)
Admission: RE | Admit: 2019-05-27 | Discharge: 2019-05-27 | Disposition: A | Discharge status: Home / Self Care | Payer: Medicare HMO | Attending: Urology | Admitting: Urology

## 2019-05-27 DIAGNOSIS — C679 Malignant neoplasm of bladder, unspecified: Secondary | ICD-10-CM | POA: Diagnosis not present

## 2019-05-27 DIAGNOSIS — N3289 Other specified disorders of bladder: Secondary | ICD-10-CM | POA: Diagnosis not present

## 2019-05-27 DIAGNOSIS — R8279 Other abnormal findings on microbiological examination of urine: Secondary | ICD-10-CM | POA: Diagnosis not present

## 2019-05-27 DIAGNOSIS — I10 Essential (primary) hypertension: Secondary | ICD-10-CM | POA: Insufficient documentation

## 2019-05-27 DIAGNOSIS — D369 Benign neoplasm, unspecified site: Secondary | ICD-10-CM

## 2019-05-27 DIAGNOSIS — Z7952 Long term (current) use of systemic steroids: Secondary | ICD-10-CM | POA: Insufficient documentation

## 2019-05-27 DIAGNOSIS — Z853 Personal history of malignant neoplasm of breast: Secondary | ICD-10-CM | POA: Diagnosis not present

## 2019-05-27 DIAGNOSIS — D090 Carcinoma in situ of bladder: Secondary | ICD-10-CM | POA: Insufficient documentation

## 2019-05-27 DIAGNOSIS — I739 Peripheral vascular disease, unspecified: Secondary | ICD-10-CM | POA: Insufficient documentation

## 2019-05-27 DIAGNOSIS — I6529 Occlusion and stenosis of unspecified carotid artery: Secondary | ICD-10-CM | POA: Diagnosis not present

## 2019-05-27 DIAGNOSIS — Z8673 Personal history of transient ischemic attack (TIA), and cerebral infarction without residual deficits: Secondary | ICD-10-CM | POA: Insufficient documentation

## 2019-05-27 DIAGNOSIS — Z7982 Long term (current) use of aspirin: Secondary | ICD-10-CM | POA: Insufficient documentation

## 2019-05-27 DIAGNOSIS — Z8551 Personal history of malignant neoplasm of bladder: Secondary | ICD-10-CM | POA: Insufficient documentation

## 2019-05-27 DIAGNOSIS — Z79899 Other long term (current) drug therapy: Secondary | ICD-10-CM | POA: Diagnosis not present

## 2019-05-27 DIAGNOSIS — G459 Transient cerebral ischemic attack, unspecified: Secondary | ICD-10-CM | POA: Diagnosis not present

## 2019-05-27 DIAGNOSIS — F17210 Nicotine dependence, cigarettes, uncomplicated: Secondary | ICD-10-CM | POA: Diagnosis not present

## 2019-05-27 DIAGNOSIS — C672 Malignant neoplasm of lateral wall of bladder: Secondary | ICD-10-CM

## 2019-05-27 HISTORY — PX: CYSTOSCOPY W/ RETROGRADES: SHX1426

## 2019-05-27 HISTORY — PX: CYSTOSCOPY WITH BIOPSY: SHX5122

## 2019-05-27 SURGERY — CYSTOSCOPY, WITH BIOPSY
Anesthesia: General

## 2019-05-27 MED ORDER — LACTATED RINGERS IV SOLN
INTRAVENOUS | Status: DC | PRN
  Administered 2019-05-27 (×2): via INTRAVENOUS

## 2019-05-27 MED ORDER — DEXAMETHASONE SODIUM PHOSPHATE 10 MG/ML IJ SOLN
INTRAMUSCULAR | Status: AC
  Filled 2019-05-27: qty 1

## 2019-05-27 MED ORDER — TRAMADOL HCL 50 MG PO TABS: 50.0000 mg | ORAL_TABLET | Freq: Four times a day (QID) | ORAL | 0 refills | Status: DC | PRN

## 2019-05-27 MED ORDER — PHENYLEPHRINE HCL (PRESSORS) 10 MG/ML IV SOLN
INTRAVENOUS | Status: DC | PRN
  Administered 2019-05-27: 120 ug via INTRAVENOUS
  Administered 2019-05-27 (×4): 80 ug via INTRAVENOUS
  Administered 2019-05-27: 120 ug via INTRAVENOUS

## 2019-05-27 MED ORDER — BELLADONNA ALKALOIDS-OPIUM 16.2-60 MG RE SUPP
RECTAL | Status: DC | PRN
  Administered 2019-05-27: 1 via RECTAL

## 2019-05-27 MED ORDER — ONDANSETRON HCL 4 MG/2ML IJ SOLN
INTRAMUSCULAR | Status: DC | PRN
  Administered 2019-05-27: 4 mg via INTRAVENOUS

## 2019-05-27 MED ORDER — PROPOFOL 10 MG/ML IV BOLUS
INTRAVENOUS | Status: DC | PRN
  Administered 2019-05-27: 100 mg via INTRAVENOUS
  Administered 2019-05-27: 20 mg via INTRAVENOUS
  Administered 2019-05-27: 50 mg via INTRAVENOUS

## 2019-05-27 MED ORDER — STERILE WATER FOR IRRIGATION IR SOLN
Status: DC | PRN
  Administered 2019-05-27 (×2): 3000 mL

## 2019-05-27 MED ORDER — SODIUM CHLORIDE 0.9 % IR SOLN
Status: DC | PRN
  Administered 2019-05-27: 3000 mL

## 2019-05-27 MED ORDER — FENTANYL CITRATE (PF) 100 MCG/2ML IJ SOLN
INTRAMUSCULAR | Status: AC
  Filled 2019-05-27: qty 2

## 2019-05-27 MED ORDER — MIDAZOLAM HCL 2 MG/2ML IJ SOLN
INTRAMUSCULAR | Status: AC
  Filled 2019-05-27: qty 2

## 2019-05-27 MED ORDER — CEFAZOLIN SODIUM-DEXTROSE 2-4 GM/100ML-% IV SOLN
2.0000 g | INTRAVENOUS | Status: AC
  Administered 2019-05-27: 08:00:00 2 g via INTRAVENOUS
  Filled 2019-05-27: qty 100

## 2019-05-27 MED ORDER — FENTANYL CITRATE (PF) 100 MCG/2ML IJ SOLN
INTRAMUSCULAR | Status: DC | PRN
  Administered 2019-05-27 (×2): 25 ug via INTRAVENOUS
  Administered 2019-05-27: 50 ug via INTRAVENOUS

## 2019-05-27 MED ORDER — LIDOCAINE HCL (CARDIAC) PF 50 MG/5ML IV SOSY
PREFILLED_SYRINGE | INTRAVENOUS | Status: DC | PRN
  Administered 2019-05-27: 75 mg via INTRAVENOUS

## 2019-05-27 MED ORDER — MIDAZOLAM HCL 5 MG/5ML IJ SOLN
INTRAMUSCULAR | Status: DC | PRN
  Administered 2019-05-27: 1 mg via INTRAVENOUS

## 2019-05-27 MED ORDER — PHENYLEPHRINE 40 MCG/ML (10ML) SYRINGE FOR IV PUSH (FOR BLOOD PRESSURE SUPPORT)
PREFILLED_SYRINGE | INTRAVENOUS | Status: AC
  Filled 2019-05-27: qty 10

## 2019-05-27 MED ORDER — ONDANSETRON HCL 4 MG/2ML IJ SOLN
INTRAMUSCULAR | Status: AC
  Filled 2019-05-27: qty 2

## 2019-05-27 MED ORDER — CLOPIDOGREL BISULFATE 75 MG PO TABS: 75.0000 mg | ORAL_TABLET | Freq: Every day | ORAL | 0 refills | Status: DC

## 2019-05-27 MED ORDER — SODIUM CHLORIDE 0.9 % IV SOLN
INTRAVENOUS | Status: DC | PRN
  Administered 2019-05-27: 30 mL

## 2019-05-27 MED ORDER — BELLADONNA ALKALOIDS-OPIUM 16.2-30 MG RE SUPP
RECTAL | Status: AC
  Filled 2019-05-27: qty 1

## 2019-05-27 MED ORDER — DEXAMETHASONE SODIUM PHOSPHATE 10 MG/ML IJ SOLN
INTRAMUSCULAR | Status: DC | PRN
  Administered 2019-05-27: 6 mg via INTRAVENOUS

## 2019-05-27 MED ORDER — LIDOCAINE 2% (20 MG/ML) 5 ML SYRINGE
INTRAMUSCULAR | Status: AC
  Filled 2019-05-27: qty 5

## 2019-05-27 MED ORDER — PROPOFOL 10 MG/ML IV BOLUS
INTRAVENOUS | Status: AC
  Filled 2019-05-27: qty 20

## 2019-05-27 MED ORDER — LACTATED RINGERS IV SOLN
INTRAVENOUS | Status: DC
  Administered 2019-05-27: 07:00:00 via INTRAVENOUS

## 2019-05-27 MED ORDER — ONDANSETRON HCL 4 MG PO TABS: 4.0000 mg | ORAL_TABLET | Freq: Three times a day (TID) | ORAL | 0 refills | Status: DC | PRN

## 2019-05-27 SURGICAL SUPPLY — 23 items
BAG URO CATCHER STRL LF (MISCELLANEOUS) ×3 IMPLANT
BASKET ZERO TIP NITINOL 2.4FR (BASKET) IMPLANT
CATH URET 5FR 28IN OPEN ENDED (CATHETERS) ×3 IMPLANT
CLOTH BEACON ORANGE TIMEOUT ST (SAFETY) ×3 IMPLANT
COVER WAND RF STERILE (DRAPES) IMPLANT
ELECT REM PT RETURN 15FT ADLT (MISCELLANEOUS) ×3 IMPLANT
EXTRACTOR STONE 1.7FRX115CM (UROLOGICAL SUPPLIES) IMPLANT
GLOVE BIOGEL M STRL SZ7.5 (GLOVE) ×3 IMPLANT
GOWN STRL REUS W/TWL XL LVL3 (GOWN DISPOSABLE) ×3 IMPLANT
GUIDEWIRE ANG ZIPWIRE 038X150 (WIRE) IMPLANT
GUIDEWIRE STR DUAL SENSOR (WIRE) ×3 IMPLANT
KIT TURNOVER KIT A (KITS) IMPLANT
LOOP CUT BIPOLAR 24F LRG (ELECTROSURGICAL) IMPLANT
MANIFOLD NEPTUNE II (INSTRUMENTS) ×3 IMPLANT
NDL SAFETY ECLIPSE 18X1.5 (NEEDLE) ×2 IMPLANT
NEEDLE HYPO 18GX1.5 SHARP (NEEDLE) ×1
PACK CYSTO (CUSTOM PROCEDURE TRAY) ×3 IMPLANT
SHEATH URETERAL 12FRX28CM (UROLOGICAL SUPPLIES) IMPLANT
SHEATH URETERAL 12FRX35CM (MISCELLANEOUS) IMPLANT
SYRINGE IRR TOOMEY STRL 70CC (SYRINGE) IMPLANT
TUBING CONNECTING 10 (TUBING) ×3 IMPLANT
TUBING UROLOGY SET (TUBING) ×3 IMPLANT
WIRE COONS/BENSON .038X145CM (WIRE) IMPLANT

## 2019-05-27 NOTE — Anesthesia Procedure Notes (Signed)
Procedure Name: LMA Insertion Date/Time: 05/27/2019 7:44 AM Performed by: Lissa Morales, CRNA Pre-anesthesia Checklist: Patient identified, Emergency Drugs available, Suction available and Patient being monitored Patient Re-evaluated:Patient Re-evaluated prior to induction Oxygen Delivery Method: Circle system utilized Preoxygenation: Pre-oxygenation with 100% oxygen Induction Type: IV induction Ventilation: Mask ventilation without difficulty LMA: LMA with gastric port inserted LMA Size: 4.0 Tube type: Oral Number of attempts: 1 Airway Equipment and Method: Oral airway Placement Confirmation: positive ETCO2 Tube secured with: Tape Dental Injury: Teeth and Oropharynx as per pre-operative assessment

## 2019-05-27 NOTE — Anesthesia Postprocedure Evaluation (Signed)
Anesthesia Post Note  Patient: [name]  Procedure(s) Performed: CYSTOSCOPY WITH BLADDER  BIOPSY (N/A ) CYSTOSCOPY WITH BILATERAL RETROGRADE PYELOGRAM (Bilateral )     Patient location during evaluation: PACU Anesthesia Type: General Level of consciousness: awake and alert Pain management: pain level controlled Vital Signs Assessment: post-procedure vital signs reviewed and stable Respiratory status: spontaneous breathing, nonlabored ventilation and respiratory function stable Cardiovascular status: blood pressure returned to baseline and stable Postop Assessment: no apparent nausea or vomiting Anesthetic complications: no    Last Vitals:  Vitals:   05/27/19 0845 05/27/19 0900  BP: 133/70 (!) 109/57  Pulse: 71 69  Resp: 11 11  Temp:    SpO2: 99% 96%    Last Pain:  Vitals:   05/27/19 0900  TempSrc:   PainSc: 0-No pain                 Lidia Collum

## 2019-05-27 NOTE — Op Note (Signed)
Preoperative diagnosis:  1. History of transitional cell carcinoma of the bladder, w/ positive urine cytology concerning for malignancy  Postoperative diagnosis:  1. Same  Procedure: 1. Bilateral retrograde pyelograms with interpretation 2. Bladder biopsy with fulguration  Surgeon: [name], MD  Anesthesia: General  Complications: None  Intraoperative findings:  #1: The patient's left retrograde pyelogram was performed using 10 cc of Omnipaque contrast demonstrating a normal caliber ureter with slight narrowing as the ureter exited the pelvis, but there was no hydro-or filling defects. #2: The patient's left retrograde pyelogram was performed in a similar fashion and there were similar findings with a narrowing at the pelvic inlet of the ureter, but no significant hydronephrosis or filling defects. #3: There was some erythema surrounding the patient's scar, the site of the previous resection, which was biopsied and copiously fulgurated. #4: Several random biopsies were taken as well. #5: The patient's left ureteral orifice was fulgurated as part of the surrounding area of the previously resected scar, but it was noted to be draining with brisk efflux of urine, and as such no stent was left.  EBL: Minimal  Specimens:  #1: Biopsy specimens around the previously resected tumor #2: Biopsy of the left lateral wall, posterior wall, right lateral wall, and bladder trigone  Indication: [name] is a 73 y.o. patient with history of bladder cancer.  On her 36-month cystoscopy she had some erythema around the scar of her previously resected tumor.  Urine cytology returned positive for malignancy.  As such, I discussed the significance of this finding and recommended we go back to the operating room for bladder biopsies and fulguration.  After reviewing the management options for treatment, he elected to proceed with the above surgical procedure(s). We have discussed the potential  benefits and risks of the procedure, side effects of the proposed treatment, the likelihood of the patient achieving the goals of the procedure, and any potential problems that might occur during the procedure or recuperation. Informed consent has been obtained.  Description of procedure:  The patient was taken to the operating room and general anesthesia was induced.  The patient was placed in the dorsal lithotomy position, prepped and draped in the usual sterile fashion, and preoperative antibiotics were administered. A preoperative time-out was performed.   21 French 30 degrees cystoscope was gently passed through the patient's urethra into the bladder under visual guidance.  The 30 degree lens was then exchanged for the 70 degree lens and a 360 degrees cystoscopic evaluation was performed demonstrating the above findings.  I then replaced the 70 degree lens with a 30 degree lens and performed the retrograde pyelograms using a 5 French open-ended ureteral catheter with the above findings.  The rigid biopsy forceps then used to perform the bladder biopsies as described above.  I subsequently used the Bugbee cautery and copiously cauterized the areas of biopsy as well as the areas of concern around the scar tissue.  The bladder was then emptied and a B&O suppository placed in the patient's rectum.  The patient was subsequently extubated return to the PACU in stable condition.  [name], M.D.

## 2019-05-27 NOTE — Discharge Instructions (Signed)

## 2019-05-27 NOTE — Transfer of Care (Signed)
Immediate Anesthesia Transfer of Care Note  Patient: [name]  Procedure(s) Performed: CYSTOSCOPY WITH BLADDER  BIOPSY (N/A ) CYSTOSCOPY WITH BILATERAL RETROGRADE PYELOGRAM (Bilateral )  Patient Location: PACU  Anesthesia Type:General  Level of Consciousness: awake, alert , oriented and patient cooperative  Airway & Oxygen Therapy: Patient Spontanous Breathing and Patient connected to face mask oxygen  Post-op Assessment: Report given to RN, Post -op Vital signs reviewed and stable and Patient moving all extremities X 4  Post vital signs: stable  Last Vitals:  Vitals Value Taken Time  BP 124/57 05/27/19 0838  Temp    Pulse 72 05/27/19 0838  Resp 13 05/27/19 0838  SpO2 100 % 05/27/19 0838  Vitals shown include unvalidated device data.  Last Pain:  Vitals:   05/27/19 0535  TempSrc: Oral  PainSc:       Patients Stated Pain Goal: 4 (27/03/50 0938)  Complications: No apparent anesthesia complications

## 2019-05-27 NOTE — H&P (Signed)
f/u for transitional cell carcinoma  °HPI: [name] is a 73 year-old female established patient who is here for surveillance of bladder cancer. °  °She underwent a TURBT. Her last bladder tumor was resected approximately 01/25/2019. She has had the a total of 1 bladder resections. The patient had their first TURBT in approximately 01/26/2019.  °  °Tumor Pathology: Superificial non-invasive TCC (areas of high grade dysplasia).  °  °The patient did not have any post-operative bladder instillations.  °  °She is not having pain in new locations. She has not had blood in her urine recently. She has recently had unwanted weight loss.  °  °The patient was seen today on an urgent basis because she thought that she was having blood in her urine. This happened this morning as there was blood in the toilet. That time she had both urine and stool. The 2nd time she went to the bathroom in the morning she urinated in noted no blood. She then returned again and had blood in her stool. However, she still had the appointment and kept it.  °  °The patient has not had any blood in her stool prior to this. She has intermittent constipation. She states that she has had foul-smelling gas recently as well as small stool balls. Her last colonoscopy was 2 years ago.  °  °She denies any dysuria or gross hematuria.  °  °  °ALLERGIES: Latex °  °  °MEDICATIONS: Aspirin  °Hydrochlorothiazide 25 mg tablet  °Cholecalciferol  °Clopidogrel  °Ketotifen Fumarate 0.025 % (0.035 %) drops  °Prednisolone  °Triamcinolone Acetonide 0.1 % cream  °  °  °GU PSH: Cystoscopy - 12/23/2018 °Cystoscopy TURBT <2 cm - 01/26/2019 °Hysterectomy - about 1970 °  °   °PSH Notes: Surgery for blockage  °  °NON-GU PSH: Biopsy/remove Lymph Nodes, R breast  °Cataract surgery - 12/11/2018 °  °  °GU PMH: Bladder Cancer Lateral - 02/08/2019 °Urinary Retention - 01/11/2019 °Gross hematuria - 12/23/2018 °  °  °NON-GU PMH: Cataract °Hypercholesterolemia °Malignant neoplasm of  unspecified site of unspecified female breast °  °  °FAMILY HISTORY: 1 son - Son °Cancer - Other, Aunt, Mother °father deceased - Runs in Family °mother deceased - Runs in Family  °  °SOCIAL HISTORY: Marital Status: Unknown °Preferred Language: English; Ethnicity: Not Hispanic Or Latino; Race: Black or African American °Current Smoking Status: Patient smokes. Has smoked since 12/18/1968. Smokes 1/2 pack per day.  °  °Tobacco Use Assessment Completed: Used Tobacco in last 30 days? °Does not use drugs. °Drinks 1 caffeinated drink per day. °Patient's occupation is/was Retired. °  °  °REVIEW OF SYSTEMS:    °GU Review Female:   Patient denies frequent urination, hard to postpone urination, burning /pain with urination, get up at night to urinate, leakage of urine, stream starts and stops, trouble starting your stream, have to strain to urinate, and being pregnant.  °Gastrointestinal (Upper):   Patient denies nausea, vomiting, and indigestion/ heartburn.  °Gastrointestinal (Lower):   Patient reports constipation. Patient denies diarrhea.  °Constitutional:   Patient denies fever, night sweats, weight loss, and fatigue.  °Skin:   Patient denies skin rash/ lesion and itching.  °Eyes:   Patient denies blurred vision and double vision.  °Ears/ Nose/ Throat:   Patient denies sore throat and sinus problems.  °Hematologic/Lymphatic:   Patient denies swollen glands and easy bruising.  °Cardiovascular:   Patient denies leg swelling and chest pains.  °Respiratory:   Patient denies cough   and shortness of breath.  °Endocrine:   Patient denies excessive thirst.  °Musculoskeletal:   Patient denies back pain and joint pain.  °Neurological:   Patient denies headaches and dizziness.  °Psychologic:   Patient denies depression and anxiety.  °  °VITAL SIGNS:    °  05/06/2019 03:39 PM  °Weight 130 lb / 58.97 kg  °BP 86/54 mmHg  °Pulse 123 /min  °Temperature 98.9 F / 37.1 C  °  °GU PHYSICAL EXAMINATION:    °Digital Rectal Exam: Normal sphincter  tone. She has palpable internal hemorrhoids, and visible blood at the anus.  °Urethral Meatus: Mild urethral meatal stenosis. Normal position. No discharge.   °Vagina: Moderate vaginal atrophy, mild introital stenosis. No rectocele. No cystocele. No enterocele.   °  °MULTI-SYSTEM PHYSICAL EXAMINATION:    °  °  °PAST DATA REVIEWED:  °Source Of History:  Patient  °Records Review:   Pathology Reports, Previous Doctor Records, Previous Patient Records, POC Tool  °  °PROCEDURES:    °     Flexible Cystoscopy - 52000  °Risks, benefits, and some of the potential complications of the procedure were discussed at length with the patient including infection, bleeding, voiding discomfort, urinary retention, fever, chills, sepsis, and others. All questions were answered. Informed consent was obtained. Antibiotic prophylaxis was given. Sterile technique and intraurethral analgesia were used.  °Meatus:  Normal size. Normal location. Normal condition.  °Urethra:  No hypermobility. No leakage.  °Ureteral Orifices:  Normal location. Normal size. Normal shape. Effluxed clear urine.  °Bladder:  Over the patient's left lateral wall she has some erythema, flat appearing, and could be CIS. It also could be erythema from cystitis. I sent a urine culture and cytology.  °  °  °The lower urinary tract was carefully examined. The procedure was well-tolerated and without complications. Antibiotic instructions were given. Instructions were given to call the office immediately for bloody urine, difficulty urinating, urinary retention, painful or frequent urination, fever, chills, nausea, vomiting or other illness. The patient stated that she understood these instructions and would comply with them.  °  °  °     Urinalysis w/Scope °Dipstick Dipstick Cont'd Micro  °Color: Yellow Bilirubin: Neg mg/dL WBC/hpf: 0 - 5/hpf  °Appearance: Cloudy Ketones: Trace mg/dL RBC/hpf: 0 - 2/hpf  °Specific Gravity: 1.030 Blood: Neg ery/uL Bacteria: Mod (26-50/hpf)   °pH: 5.5 Protein: 1+ mg/dL Cystals: NS (Not Seen)  °Glucose: Neg mg/dL Urobilinogen: 1.0 mg/dL Casts: Hyaline  °  Nitrites: Neg Trichomonas: Not Present  °  Leukocyte Esterase: Trace leu/uL Mucous: Present  °    Epithelial Cells: 6 - 10/hpf  °    Yeast: NS (Not Seen)  °    Sperm: Not Present  °  °  °ASSESSMENT:  °    ICD-10 Details  °1 GU:   Bladder Cancer Lateral - C67.2   °  °PLAN:    °  °      Orders °Labs Urine Culture, Urine Cytology  °  °  °      Schedule °Return Visit/Planned Activity: 6 Months - Office Visit  °  °  °      Document °Letter(s):  Created for Patient: Clinical Summary  °  °  °     Notes:   The patient had an area on the left lateral side wall of her bladder that was erythematous and flat, likely cystitis in nature but could potentially be CIS. I sent a urine cytology as well as   urine culture. We will keep in touch with her with the results. I would like her to return in 6 months given the abnormality, assuming that this is not high-grade CIS.  °  °The patient had some blood in the rectal vault/anus. I also palpated hemorrhoids. I suspect this is the etiology of her bleeding. I did recommend that she touch base with her primary care provider on Monday. I suspect that she will get better, but discussed with her the the importance of going to the emergency department if she started to develop significant bleeding and weakness.  °

## 2019-05-27 NOTE — Interval H&P Note (Signed)
History and Physical Interval Note: The patient's urine cytology returned positive for malignancy.  As such, we opted to take the patient to the operating room for bladder biopsies, fulguration of these areas of concern, and bilateral retrograde pyelograms.  I went through this procedure in detail with the patient and she agreed to proceed.  05/27/2019 7:28 AM  [name]  has presented today for surgery, with the diagnosis of BLADDER CANCER.  The various methods of treatment have been discussed with the patient and family. After consideration of risks, benefits and other options for treatment, the patient has consented to  Procedure(s): CYSTOSCOPY WITH BLADDER  BIOPSY (N/A) CYSTOSCOPY WITH BILATERAL RETROGRADE PYELOGRAM (Bilateral) as a surgical intervention.  The patient's history has been reviewed, patient examined, no change in status, stable for surgery.  I have reviewed the patient's chart and labs.  Questions were answered to the patient's satisfaction.     [name]

## 2019-05-30 ENCOUNTER — Encounter (HOSPITAL_COMMUNITY): Payer: Self-pay | Admitting: Urology

## 2019-05-30 ENCOUNTER — Other Ambulatory Visit: Payer: Self-pay | Admitting: Family Medicine

## 2019-06-06 ENCOUNTER — Encounter: Payer: Self-pay | Admitting: Family Medicine

## 2019-06-10 DIAGNOSIS — C678 Malignant neoplasm of overlapping sites of bladder: Secondary | ICD-10-CM | POA: Diagnosis not present

## 2019-07-05 DIAGNOSIS — C672 Malignant neoplasm of lateral wall of bladder: Secondary | ICD-10-CM | POA: Diagnosis not present

## 2019-07-05 DIAGNOSIS — Z5111 Encounter for antineoplastic chemotherapy: Secondary | ICD-10-CM | POA: Diagnosis not present

## 2019-07-12 DIAGNOSIS — Z5111 Encounter for antineoplastic chemotherapy: Secondary | ICD-10-CM | POA: Diagnosis not present

## 2019-07-12 DIAGNOSIS — C672 Malignant neoplasm of lateral wall of bladder: Secondary | ICD-10-CM | POA: Diagnosis not present

## 2019-07-19 DIAGNOSIS — Z5111 Encounter for antineoplastic chemotherapy: Secondary | ICD-10-CM | POA: Diagnosis not present

## 2019-07-19 DIAGNOSIS — C672 Malignant neoplasm of lateral wall of bladder: Secondary | ICD-10-CM | POA: Diagnosis not present

## 2019-07-26 DIAGNOSIS — Z5111 Encounter for antineoplastic chemotherapy: Secondary | ICD-10-CM | POA: Diagnosis not present

## 2019-07-26 DIAGNOSIS — C672 Malignant neoplasm of lateral wall of bladder: Secondary | ICD-10-CM | POA: Diagnosis not present

## 2019-08-02 DIAGNOSIS — C672 Malignant neoplasm of lateral wall of bladder: Secondary | ICD-10-CM | POA: Diagnosis not present

## 2019-08-02 DIAGNOSIS — Z5111 Encounter for antineoplastic chemotherapy: Secondary | ICD-10-CM | POA: Diagnosis not present

## 2019-08-09 DIAGNOSIS — C678 Malignant neoplasm of overlapping sites of bladder: Secondary | ICD-10-CM | POA: Diagnosis not present

## 2019-08-09 DIAGNOSIS — Z5111 Encounter for antineoplastic chemotherapy: Secondary | ICD-10-CM | POA: Diagnosis not present

## 2019-08-11 ENCOUNTER — Other Ambulatory Visit: Payer: Self-pay | Admitting: Family Medicine

## 2019-08-11 DIAGNOSIS — I10 Essential (primary) hypertension: Secondary | ICD-10-CM

## 2019-08-11 NOTE — Telephone Encounter (Signed)
Pt has enough med until appt. Will deny

## 2019-08-16 ENCOUNTER — Other Ambulatory Visit: Payer: Self-pay

## 2019-08-16 ENCOUNTER — Ambulatory Visit (INDEPENDENT_AMBULATORY_CARE_PROVIDER_SITE_OTHER): Payer: Medicare HMO | Admitting: Family Medicine

## 2019-08-16 ENCOUNTER — Encounter: Payer: Self-pay | Admitting: Family Medicine

## 2019-08-16 VITALS — BP 117/75 | Temp 98.7°F | Wt 130.0 lb

## 2019-08-16 DIAGNOSIS — C679 Malignant neoplasm of bladder, unspecified: Secondary | ICD-10-CM

## 2019-08-16 DIAGNOSIS — D649 Anemia, unspecified: Secondary | ICD-10-CM

## 2019-08-16 NOTE — Progress Notes (Signed)
   Subjective:    Patient ID: [name], female    DOB: May 22, 1946, 73 y.o.   MRN: [id number]  HPI Documentation for virtual telephone encounter.  Documentation for virtual audio and video telecommunications through [employer] encounter:  Interactive audio and video telecommunications were attempted between this provider and patient, however due the patient not have access to video capability.  We continued and completed visit with audio only. The patient was located at home. The provider was located in the office. The patient did consent to this visit and is aware of possible charges through their insurance fo5r this visit. The other persons participating in this telemedicine service were none. Time spent on call was 10 minutes and in review of previous records >15 minutes total. This virtual service is not related to other E/M service within previous 7 days. Since last being seen by me in June she had made one trip to the emergency room for GI bleeding.  She also saw Dr. Collene Mares who subsequently did a colonoscopy.  Apparently benign polyps were found.  She is also been seen by Dr. Louis Meckel for evaluation and treatment of bladder cancer.  She has apparently been through 6 weeks of therapy with BCG.  At this point she seems to be doing well.  She does have a scheduled follow-up appointment with Dr. Louis Meckel later this fall. Blood work from July did show hemoglobin of 8.2.  She has not had follow-up on that.  She states that she has not had any more rectal bleeding.  Review of Systems     Objective:   Physical Exam Alert and in no distress otherwise not examined      Assessment & Plan:  Malignant neoplasm of urinary bladder, unspecified site (HCC)  Anemia, unspecified type I recommend that she come in for blood studies however she would like to hold off for couple months.  Recommend that she come in within the next month or 2 to get blood work done.

## 2019-08-16 NOTE — Addendum Note (Signed)
Addended by: Denita Lung on: 08/16/2019 02:46 PM   Modules accepted: Level of Service

## 2019-08-17 DIAGNOSIS — H35363 Drusen (degenerative) of macula, bilateral: Secondary | ICD-10-CM | POA: Diagnosis not present

## 2019-08-17 DIAGNOSIS — H35033 Hypertensive retinopathy, bilateral: Secondary | ICD-10-CM | POA: Diagnosis not present

## 2019-08-17 DIAGNOSIS — H43393 Other vitreous opacities, bilateral: Secondary | ICD-10-CM | POA: Diagnosis not present

## 2019-08-17 DIAGNOSIS — H35013 Changes in retinal vascular appearance, bilateral: Secondary | ICD-10-CM | POA: Diagnosis not present

## 2019-08-29 ENCOUNTER — Other Ambulatory Visit: Payer: Self-pay | Admitting: Family Medicine

## 2019-08-29 DIAGNOSIS — I10 Essential (primary) hypertension: Secondary | ICD-10-CM

## 2019-09-01 ENCOUNTER — Telehealth: Payer: Self-pay

## 2019-09-01 NOTE — Telephone Encounter (Signed)
Pt has been dealing bladder cancer and will  schedule it soon. [name]

## 2019-09-01 NOTE — Telephone Encounter (Signed)
-----   Message from Denita Lung, MD sent at 09/01/2019  1:29 PM EDT ----- Take care of this ----- Message ----- From: Patience Musca Sent: 09/01/2019  11:45 AM EDT To: Denita Lung, MD  This patient's last mammogram was Oct 2018.  She is due for one per [facility].

## 2019-09-19 ENCOUNTER — Telehealth: Payer: Self-pay | Admitting: Family Medicine

## 2019-09-19 NOTE — Telephone Encounter (Signed)
Pt called and wanted to see if she needed to schedule an appt for blood work or to have a actual visit with you. Pt stated that she has been going threw cancer and also wants to know will it be ok for her to take her flu shot.

## 2019-09-19 NOTE — Telephone Encounter (Signed)
Have her set up a visit and we can give her the flu shot

## 2019-09-20 NOTE — Telephone Encounter (Signed)
Done KH 

## 2019-09-22 ENCOUNTER — Ambulatory Visit (INDEPENDENT_AMBULATORY_CARE_PROVIDER_SITE_OTHER): Payer: Medicare HMO | Admitting: Family Medicine

## 2019-09-22 ENCOUNTER — Other Ambulatory Visit: Payer: Self-pay

## 2019-09-22 ENCOUNTER — Encounter: Payer: Self-pay | Admitting: Family Medicine

## 2019-09-22 VITALS — BP 128/74 | HR 84 | Temp 97.1°F | Ht 63.0 in | Wt 137.0 lb

## 2019-09-22 DIAGNOSIS — I6523 Occlusion and stenosis of bilateral carotid arteries: Secondary | ICD-10-CM | POA: Diagnosis not present

## 2019-09-22 DIAGNOSIS — Z23 Encounter for immunization: Secondary | ICD-10-CM

## 2019-09-22 DIAGNOSIS — I739 Peripheral vascular disease, unspecified: Secondary | ICD-10-CM | POA: Diagnosis not present

## 2019-09-22 DIAGNOSIS — Z95828 Presence of other vascular implants and grafts: Secondary | ICD-10-CM

## 2019-09-22 DIAGNOSIS — D649 Anemia, unspecified: Secondary | ICD-10-CM

## 2019-09-22 LAB — COMPREHENSIVE METABOLIC PANEL
ALT: 14 IU/L (ref 0–32)
AST: 28 IU/L (ref 0–40)
Albumin/Globulin Ratio: 1.2 (ref 1.2–2.2)
Albumin: 4.1 g/dL (ref 3.7–4.7)
Alkaline Phosphatase: 147 IU/L — ABNORMAL HIGH (ref 39–117)
BUN/Creatinine Ratio: 9 — ABNORMAL LOW (ref 12–28)
BUN: 8 mg/dL (ref 8–27)
Bilirubin Total: 0.3 mg/dL (ref 0.0–1.2)
CO2: 26 mmol/L (ref 20–29)
Calcium: 9.5 mg/dL (ref 8.7–10.3)
Chloride: 94 mmol/L — ABNORMAL LOW (ref 96–106)
Creatinine, Ser: 0.85 mg/dL (ref 0.57–1.00)
GFR calc Af Amer: 79 mL/min/{1.73_m2} (ref >59)
GFR calc non Af Amer: 69 mL/min/{1.73_m2} (ref >59)
Globulin, Total: 3.5 g/dL (ref 1.5–4.5)
Glucose: 71 mg/dL (ref 65–99)
Potassium: 3.6 mmol/L (ref 3.5–5.2)
Sodium: 136 mmol/L (ref 134–144)
Total Protein: 7.6 g/dL (ref 6.0–8.5)

## 2019-09-22 LAB — CBC WITH DIFFERENTIAL/PLATELET
Basophils Absolute: 0 10*3/uL (ref 0.0–0.2)
Basos: 1 %
EOS (ABSOLUTE): 0.1 10*3/uL (ref 0.0–0.4)
Eos: 1 %
Hematocrit: 34.4 % (ref 34.0–46.6)
Hemoglobin: 10.7 g/dL — ABNORMAL LOW (ref 11.1–15.9)
Immature Grans (Abs): 0 10*3/uL (ref 0.0–0.1)
Immature Granulocytes: 0 %
Lymphocytes Absolute: 2.8 10*3/uL (ref 0.7–3.1)
Lymphs: 47 %
MCH: 24.8 pg — ABNORMAL LOW (ref 26.6–33.0)
MCHC: 31.1 g/dL — ABNORMAL LOW (ref 31.5–35.7)
MCV: 80 fL (ref 79–97)
Monocytes Absolute: 0.5 10*3/uL (ref 0.1–0.9)
Monocytes: 9 %
Neutrophils Absolute: 2.5 10*3/uL (ref 1.4–7.0)
Neutrophils: 42 %
Platelets: 267 10*3/uL (ref 150–450)
RBC: 4.31 x10E6/uL (ref 3.77–5.28)
RDW: 19.6 % — ABNORMAL HIGH (ref 11.7–15.4)
WBC: 5.8 10*3/uL (ref 3.4–10.8)

## 2019-09-22 NOTE — Progress Notes (Signed)
   Subjective:    Patient ID: [name], female    DOB: [birth year], 73 y.o.   MRN: [id number]  HPI She is here for an interval evaluation.  She is in the process of being treated for bladder cancer.  She also has concerns over pulses in her extremities.  She has had a femoropopliteal bypass.  She does complain of some vague lower extremity discomfort but cannot get a good feel as to whether it is with physical activity.  Review of her record also indicates she has bilateral carotid stenosis but has not had any evaluation in quite some time.  She does not complain of any blurred vision, double vision, other neurologic complaints.  Review of the record also indicates an anemia.  Also of note is that she does have a mammogram scheduled.   Review of Systems     Objective:   Physical Exam Alert and in no distress.  Exam of her lower extremities shows normal skin.  Distal pulses were not well appreciated.  Femoral pulses were normal.  No carotid bruits noted.  Cardiac exam shows regular rhythm without murmurs or gallops.       Assessment & Plan:  Need for influenza vaccination - Plan: Flu Vaccine QUAD High Dose(Fluad), CANCELED: Flu Vaccine QUAD High Dose(Fluad)  PVD (peripheral vascular disease) ([place]) - Plan: CBC with Differential, Comprehensive metabolic panel, VAS [place] LE ART SEG MULTI (Segm&LE Reynauds)  Asymptomatic bilateral carotid artery stenosis - Plan: US Carotid Duplex Bilateral  History of aorto-femoral bypass - 2012 - Plan: VAS [place] LE ART SEG MULTI (Segm&LE Reynauds)  Anemia, unspecified type - Plan: CBC with Differential

## 2019-09-27 ENCOUNTER — Telehealth: Payer: Self-pay

## 2019-09-27 NOTE — Telephone Encounter (Signed)
My order was supposed to be for an arterial Doppler.  Set this up wherever it can get done

## 2019-09-27 NOTE — Telephone Encounter (Signed)
Juliann Pulse advise they do not preform the pressure doppler. They how every do the ABI. Please advise if you would like it changed. Thanks [employer]

## 2019-09-27 NOTE — Telephone Encounter (Signed)
LVM for Juliann Pulse to change to [place] [facility]. [place]

## 2019-10-04 DIAGNOSIS — Z853 Personal history of malignant neoplasm of breast: Secondary | ICD-10-CM | POA: Diagnosis not present

## 2019-10-04 DIAGNOSIS — Z1231 Encounter for screening mammogram for malignant neoplasm of breast: Secondary | ICD-10-CM | POA: Diagnosis not present

## 2019-10-04 LAB — HM MAMMOGRAPHY

## 2019-10-18 ENCOUNTER — Telehealth: Payer: Self-pay | Admitting: Internal Medicine

## 2019-10-18 DIAGNOSIS — R748 Abnormal levels of other serum enzymes: Secondary | ICD-10-CM

## 2019-10-18 NOTE — Telephone Encounter (Signed)
Pt has upcoming lab visit and says repeat alkaline phos. Please put in future orders

## 2019-10-28 ENCOUNTER — Other Ambulatory Visit: Payer: Self-pay

## 2019-10-28 ENCOUNTER — Other Ambulatory Visit: Payer: Medicare HMO

## 2019-10-28 DIAGNOSIS — R748 Abnormal levels of other serum enzymes: Secondary | ICD-10-CM | POA: Diagnosis not present

## 2019-10-28 LAB — ALKALINE PHOSPHATASE: Alkaline Phosphatase: 142 IU/L — ABNORMAL HIGH (ref 39–117)

## 2019-11-04 LAB — ALKALINE PHOSPHATASE, ISOENZYMES
Alkaline Phosphatase: 137 IU/L — ABNORMAL HIGH (ref 39–117)
BONE FRACTION: 31 % (ref 14–68)
INTESTINAL FRAC.: 3 % (ref 0–18)
LIVER FRACTION: 66 % (ref 18–85)

## 2019-11-04 LAB — SPECIMEN STATUS REPORT

## 2019-11-08 DIAGNOSIS — C678 Malignant neoplasm of overlapping sites of bladder: Secondary | ICD-10-CM | POA: Diagnosis not present

## 2019-11-16 ENCOUNTER — Ambulatory Visit (HOSPITAL_BASED_OUTPATIENT_CLINIC_OR_DEPARTMENT_OTHER)
Admission: RE | Admit: 2019-11-16 | Discharge: 2019-11-16 | Disposition: A | Discharge status: Home / Self Care | Payer: Medicare HMO | Source: Ambulatory Visit | Attending: Cardiovascular Disease | Admitting: Cardiovascular Disease

## 2019-11-16 ENCOUNTER — Other Ambulatory Visit: Payer: Self-pay

## 2019-11-16 ENCOUNTER — Other Ambulatory Visit (HOSPITAL_COMMUNITY): Payer: Self-pay | Admitting: Family Medicine

## 2019-11-16 ENCOUNTER — Other Ambulatory Visit: Payer: Self-pay | Admitting: Family Medicine

## 2019-11-16 ENCOUNTER — Ambulatory Visit (HOSPITAL_COMMUNITY)
Admission: RE | Admit: 2019-11-16 | Discharge: 2019-11-16 | Disposition: A | Discharge status: Home / Self Care | Payer: Medicare HMO | Source: Ambulatory Visit | Attending: Cardiovascular Disease | Admitting: Cardiovascular Disease

## 2019-11-16 DIAGNOSIS — I739 Peripheral vascular disease, unspecified: Secondary | ICD-10-CM

## 2019-11-16 DIAGNOSIS — Z95828 Presence of other vascular implants and grafts: Secondary | ICD-10-CM

## 2019-11-16 DIAGNOSIS — I6523 Occlusion and stenosis of bilateral carotid arteries: Secondary | ICD-10-CM | POA: Diagnosis not present

## 2019-11-23 DIAGNOSIS — Z5111 Encounter for antineoplastic chemotherapy: Secondary | ICD-10-CM | POA: Diagnosis not present

## 2019-11-23 DIAGNOSIS — C678 Malignant neoplasm of overlapping sites of bladder: Secondary | ICD-10-CM | POA: Diagnosis not present

## 2019-11-25 ENCOUNTER — Other Ambulatory Visit: Payer: Self-pay

## 2019-11-25 DIAGNOSIS — I739 Peripheral vascular disease, unspecified: Secondary | ICD-10-CM

## 2019-11-28 ENCOUNTER — Other Ambulatory Visit: Payer: Self-pay | Admitting: Family Medicine

## 2019-11-28 DIAGNOSIS — I10 Essential (primary) hypertension: Secondary | ICD-10-CM

## 2019-11-29 ENCOUNTER — Ambulatory Visit: Payer: Medicare HMO | Attending: Internal Medicine

## 2019-11-29 DIAGNOSIS — Z20822 Contact with and (suspected) exposure to covid-19: Secondary | ICD-10-CM | POA: Diagnosis not present

## 2019-11-30 LAB — NOVEL CORONAVIRUS, NAA: SARS-CoV-2, NAA: NOT DETECTED

## 2019-12-02 ENCOUNTER — Telehealth: Payer: Self-pay

## 2019-12-02 NOTE — Telephone Encounter (Signed)
Pt notified of negative COVID-19 results. Understanding verbalized.  Chasta M Hopkins   

## 2019-12-06 ENCOUNTER — Telehealth: Payer: Self-pay | Admitting: Family Medicine

## 2019-12-06 MED ORDER — CLOPIDOGREL BISULFATE 75 MG PO TABS: 75.0000 mg | ORAL_TABLET | Freq: Every day | ORAL | 3 refills | Status: DC

## 2019-12-06 NOTE — Telephone Encounter (Signed)
Pt called for refills of Plavix. Please send to [facility]. Pt can be reached at [phone]

## 2019-12-09 ENCOUNTER — Telehealth (HOSPITAL_COMMUNITY): Payer: Self-pay

## 2019-12-09 NOTE — Telephone Encounter (Signed)

## 2019-12-12 ENCOUNTER — Telehealth: Payer: Self-pay | Admitting: *Deleted

## 2019-12-12 ENCOUNTER — Ambulatory Visit (INDEPENDENT_AMBULATORY_CARE_PROVIDER_SITE_OTHER): Payer: Medicare HMO | Admitting: Surgery

## 2019-12-12 ENCOUNTER — Other Ambulatory Visit: Payer: Self-pay

## 2019-12-12 ENCOUNTER — Encounter: Payer: Self-pay | Admitting: Surgery

## 2019-12-12 ENCOUNTER — Encounter: Payer: Self-pay | Admitting: *Deleted

## 2019-12-12 VITALS — BP 115/71 | HR 90 | Temp 97.6°F | Resp 20 | Ht 63.0 in | Wt 133.8 lb

## 2019-12-12 DIAGNOSIS — I739 Peripheral vascular disease, unspecified: Secondary | ICD-10-CM | POA: Diagnosis not present

## 2019-12-12 NOTE — H&P (View-Only) (Signed)
Vascular and Vein Specialist of [place]  Patient name: [name] MRN: [id number] DOB: [birth year] Sex: female   REQUESTING PROVIDER:    Dr. Redmond School   REASON FOR CONSULT:    PAD  HISTORY OF PRESENT ILLNESS:   [name] is a 74 y.o. female, who is status post aorto-bifemoral bypass in on 05-16-2011 for iiliac occlusion and claudication.  She has not been seen in our office for over 7 years.  She has recently began noticing pain and kneeling in her feet which wakes her up at night.  She is also getting cramps with ambulation.  She does not have any open wounds.  Patient is currently undergoing treatment for bladder cancer.  She is medically managed for hypertension.  She takes a statin for hypercholesterolemia.  She is on dual antiplatelet therapy.  PAST MEDICAL HISTORY    Past Medical History:  Diagnosis Date  . Abdominal wall hernia   . Blood transfusion    30+ yrs. ago  . Cancer (HCC)    Right Breast  . Hyperlipidemia   . Hypertension   . Peripheral vascular disease ([place])   . Seasonal allergies   . TIA (transient ischemic attack)      FAMILY HISTORY   Family History  Problem Relation Age of Onset  . Heart attack Sister   . Heart attack Brother   . Stroke Sister   . Cancer Mother        BREAST, BRAIN  . Heart disease Mother   . Cancer Paternal Aunt        breast ca  . Cancer Paternal Grandmother        unknown ca    SOCIAL HISTORY:   Social History   Socioeconomic History  . Marital status: Married    Spouse name: Not on file  . Number of children: Not on file  . Years of education: Not on file  . Highest education level: Not on file  Occupational History  . Not on file  Tobacco Use  . Smoking status: Current Every Day Smoker    Packs/day: 0.50    Years: 30.00    Pack years: 15.00    Types: Cigarettes  . Smokeless tobacco: Never Used  . Tobacco comment: pt states that she uses the smokeless cig  and is trying to quit  Substance and Sexual Activity  . Alcohol use: No    Comment: occasional alcohol intake  . Drug use: No  . Sexual activity: Yes  Other Topics Concern  . Not on file  Social History Narrative  . Not on file   Social Determinants of Health   Financial Resource Strain:   . Difficulty of Paying Living Expenses: Not on file  Food Insecurity:   . Worried About Charity fundraiser in the Last Year: Not on file  . Ran Out of Food in the Last Year: Not on file  Transportation Needs:   . Lack of Transportation (Medical): Not on file  . Lack of Transportation (Non-Medical): Not on file  Physical Activity:   . Days of Exercise per Week: Not on file  . Minutes of Exercise per Session: Not on file  Stress:   . Feeling of Stress : Not on file  Social Connections:   . Frequency of Communication with Friends and Family: Not on file  . Frequency of Social Gatherings with Friends and Family: Not on file  . Attends Religious Services: Not on file  . Active Member  of Clubs or Organizations: Not on file  . Attends Archivist Meetings: Not on file  . Marital Status: Not on file  Intimate Partner Violence:   . Fear of Current or Ex-Partner: Not on file  . Emotionally Abused: Not on file  . Physically Abused: Not on file  . Sexually Abused: Not on file    ALLERGIES:    Allergies  Allergen Reactions  . Latex Rash    CURRENT MEDICATIONS:    Current Outpatient Medications  Medication Sig Dispense Refill  . aspirin EC 81 MG tablet Take 81 mg by mouth every evening.     [name] atorvastatin (LIPITOR) 20 MG tablet TAKE 1 TABLET BY MOUTH ONCE DAILY (Patient taking differently: Take 20 mg by mouth every evening. ) 90 tablet 3  . cholecalciferol (VITAMIN D) 1000 units tablet Take 1,000 Units by mouth every evening.     . clopidogrel (PLAVIX) 75 MG tablet Take 1 tablet (75 mg total) by mouth daily. Resume once you no longer have any bleeding in your urine or in your  stool and you have completed all your scheduled procedures. 90 tablet 3  . hydrochlorothiazide (HYDRODIURIL) 25 MG tablet TAKE 1 TABLET BY MOUTH EVERY DAY 90 tablet 0  . hydroxypropyl methylcellulose / hypromellose (ISOPTO TEARS / GONIOVISC) 2.5 % ophthalmic solution Place 1 drop into both eyes daily as needed for dry eyes.    [name] triamcinolone cream (KENALOG) 0.1 % Apply 1 application topically 2 (two) times daily as needed (rash).      No current facility-administered medications for this visit.    REVIEW OF SYSTEMS:   [X]  denotes positive finding, [ ]  denotes negative finding Cardiac  Comments:  Chest pain or chest pressure:    Shortness of breath upon exertion:    Short of breath when lying flat:    Irregular heart rhythm:        Vascular    Pain in calf, thigh, or hip brought on by ambulation: x   Pain in feet at night that wakes you up from your sleep:  x   Blood clot in your veins:    Leg swelling:         Pulmonary    Oxygen at home:    Productive cough:     Wheezing:         Neurologic    Sudden weakness in arms or legs:     Sudden numbness in arms or legs:     Sudden onset of difficulty speaking or slurred speech:    Temporary loss of vision in one eye:     Problems with dizziness:         Gastrointestinal    Blood in stool:      Vomited blood:         Genitourinary    Burning when urinating:     Blood in urine:        Psychiatric    Major depression:         Hematologic    Bleeding problems:    Problems with blood clotting too easily:        Skin    Rashes or ulcers:        Constitutional    Fever or chills:     PHYSICAL EXAM:   Vitals:   12/12/19 0957  BP: 115/71  Pulse: 90  Resp: 20  Temp: 97.6 F (36.4 C)  SpO2: 100%  Weight: 133 lb 12.8 oz (60.7 kg)  Height: 5\' 3"  (1.6 m)    GENERAL: The patient is a well-nourished female, in no acute distress. The vital signs are documented above. CARDIAC: There is a regular rate and rhythm.    VASCULAR: Nonpalpable pedal pulses PULMONARY: Nonlabored respirations ABDOMEN: Soft and non-tender.  No evidence of hernia MUSCULOSKELETAL: There are no major deformities or cyanosis. NEUROLOGIC: No focal weakness or paresthesias are detected. SKIN: There are no ulcers or rashes noted. PSYCHIATRIC: The patient has a normal affect.  STUDIES:   I have reviewed the following: CAROTID: Right Carotid: Velocities in the right ICA are consistent with a 1-39% stenosis.                Non-hemodynamically significant plaque <50% noted in the CCA.  Left Carotid: Velocities in the left ICA are consistent with a 1-39% stenosis.               Non-hemodynamically significant plaque <50% noted in the CCA.  Vertebrals:  Bilateral vertebral arteries demonstrate antegrade flow. Subclavians: Normal flow hemodynamics were seen in bilateral subclavian              arteries. +-------+-----------+-----------+------------+------------+ ABI/TBIToday's ABIToday's TBIPrevious ABIPrevious TBI +-------+-----------+-----------+------------+------------+ Right  0.67       0.48       1.03                     +-------+-----------+-----------+------------+------------+ Left   0.77       0          1.00                     +-------+-----------+-----------+------------+------------+ LEFT Toe: 0 Right Toe: 81  LE DUPLEX: Right: Atherosclerosis in the common femoral, femoral, popliteal and tibial arteries. Superficial femoral artery appears to be occluded with reconstitution of flow in the distal SFA/above knee popliteal. 50-74% stenosis in the tibioperoneal trunk. Three vessel run off.  Left: Atherosclerosis in the common femoral, femoral, popliteal and tibial arteries. 50-74% stenosis in the common femoral artery. Low flow velocities in the superficial femoral artery; probable high grade stenosis versus occlusion. Three vessel run off.  AORTO-ILIAC DUPLEX: Patent aorto-bifemoral  bypass graft with no evidence of a significant stenosis. ASSESSMENT and PLAN   Atherosclerotic vascular disease: The patient reports symptoms in her feet that could be consistent with arterial insufficiency, or possibly neuropathy.  She has had a decline in her ABIs since her last study which was in 2013.  Her superficial femoral artery on the right is occluded.  She has a stenosis in the left distal common femoral artery.  She tells me that she would not be here if she could tolerate her symptoms.  Therefore I think we need further evaluation.  I believe the next step is to proceed with angiography to define her anatomy and the extent of her disease.  I will plan on a left femoral approach.  This will more than likely be a diagnostic study.  She wants to schedule this after her next chemo dose and so we have selected February 23   Annamarie Major, Dorothy Puffer, MD, [facility] Vascular and Vein Specialists of [facility] [phone] Pager [phone]

## 2019-12-12 NOTE — Progress Notes (Signed)
Vascular and Vein Specialist of [place]  Patient name: [name] MRN: [id number] DOB: December 05, 1945 Sex: female   REQUESTING PROVIDER:    Dr. Redmond School   REASON FOR CONSULT:    PAD  HISTORY OF PRESENT ILLNESS:   [name] is a 74 y.o. female, who is status post aorto-bifemoral bypass in on 05-16-2011 for iiliac occlusion and claudication.  She has not been seen in our office for over 7 years.  She has recently began noticing pain and kneeling in her feet which wakes her up at night.  She is also getting cramps with ambulation.  She does not have any open wounds.  Patient is currently undergoing treatment for bladder cancer.  She is medically managed for hypertension.  She takes a statin for hypercholesterolemia.  She is on dual antiplatelet therapy.  PAST MEDICAL HISTORY    Past Medical History:  Diagnosis Date  . Abdominal wall hernia   . Blood transfusion    30+ yrs. ago  . Cancer (HCC)    Right Breast  . Hyperlipidemia   . Hypertension   . Peripheral vascular disease ([place])   . Seasonal allergies   . TIA (transient ischemic attack)      FAMILY HISTORY   Family History  Problem Relation Age of Onset  . Heart attack Sister   . Heart attack Brother   . Stroke Sister   . Cancer Mother        BREAST, BRAIN  . Heart disease Mother   . Cancer Paternal Aunt        breast ca  . Cancer Paternal Grandmother        unknown ca    SOCIAL HISTORY:   Social History   Socioeconomic History  . Marital status: Married    Spouse name: Not on file  . Number of children: Not on file  . Years of education: Not on file  . Highest education level: Not on file  Occupational History  . Not on file  Tobacco Use  . Smoking status: Current Every Day Smoker    Packs/day: 0.50    Years: 30.00    Pack years: 15.00    Types: Cigarettes  . Smokeless tobacco: Never Used  . Tobacco comment: pt states that she uses the smokeless cig  and is trying to quit  Substance and Sexual Activity  . Alcohol use: No    Comment: occasional alcohol intake  . Drug use: No  . Sexual activity: Yes  Other Topics Concern  . Not on file  Social History Narrative  . Not on file   Social Determinants of Health   Financial Resource Strain:   . Difficulty of Paying Living Expenses: Not on file  Food Insecurity:   . Worried About Charity fundraiser in the Last Year: Not on file  . Ran Out of Food in the Last Year: Not on file  Transportation Needs:   . Lack of Transportation (Medical): Not on file  . Lack of Transportation (Non-Medical): Not on file  Physical Activity:   . Days of Exercise per Week: Not on file  . Minutes of Exercise per Session: Not on file  Stress:   . Feeling of Stress : Not on file  Social Connections:   . Frequency of Communication with Friends and Family: Not on file  . Frequency of Social Gatherings with Friends and Family: Not on file  . Attends Religious Services: Not on file  . Active Member  of Clubs or Organizations: Not on file  . Attends Archivist Meetings: Not on file  . Marital Status: Not on file  Intimate Partner Violence:   . Fear of Current or Ex-Partner: Not on file  . Emotionally Abused: Not on file  . Physically Abused: Not on file  . Sexually Abused: Not on file    ALLERGIES:    Allergies  Allergen Reactions  . Latex Rash    CURRENT MEDICATIONS:    Current Outpatient Medications  Medication Sig Dispense Refill  . aspirin EC 81 MG tablet Take 81 mg by mouth every evening.     [name] atorvastatin (LIPITOR) 20 MG tablet TAKE 1 TABLET BY MOUTH ONCE DAILY (Patient taking differently: Take 20 mg by mouth every evening. ) 90 tablet 3  . cholecalciferol (VITAMIN D) 1000 units tablet Take 1,000 Units by mouth every evening.     . clopidogrel (PLAVIX) 75 MG tablet Take 1 tablet (75 mg total) by mouth daily. Resume once you no longer have any bleeding in your urine or in your  stool and you have completed all your scheduled procedures. 90 tablet 3  . hydrochlorothiazide (HYDRODIURIL) 25 MG tablet TAKE 1 TABLET BY MOUTH EVERY DAY 90 tablet 0  . hydroxypropyl methylcellulose / hypromellose (ISOPTO TEARS / GONIOVISC) 2.5 % ophthalmic solution Place 1 drop into both eyes daily as needed for dry eyes.    [name] triamcinolone cream (KENALOG) 0.1 % Apply 1 application topically 2 (two) times daily as needed (rash).      No current facility-administered medications for this visit.    REVIEW OF SYSTEMS:   [X]  denotes positive finding, [ ]  denotes negative finding Cardiac  Comments:  Chest pain or chest pressure:    Shortness of breath upon exertion:    Short of breath when lying flat:    Irregular heart rhythm:        Vascular    Pain in calf, thigh, or hip brought on by ambulation: x   Pain in feet at night that wakes you up from your sleep:  x   Blood clot in your veins:    Leg swelling:         Pulmonary    Oxygen at home:    Productive cough:     Wheezing:         Neurologic    Sudden weakness in arms or legs:     Sudden numbness in arms or legs:     Sudden onset of difficulty speaking or slurred speech:    Temporary loss of vision in one eye:     Problems with dizziness:         Gastrointestinal    Blood in stool:      Vomited blood:         Genitourinary    Burning when urinating:     Blood in urine:        Psychiatric    Major depression:         Hematologic    Bleeding problems:    Problems with blood clotting too easily:        Skin    Rashes or ulcers:        Constitutional    Fever or chills:     PHYSICAL EXAM:   Vitals:   12/12/19 0957  BP: 115/71  Pulse: 90  Resp: 20  Temp: 97.6 F (36.4 C)  SpO2: 100%  Weight: 133 lb 12.8 oz (60.7 kg)  Height: 5\' 3"  (1.6 m)    GENERAL: The patient is a well-nourished female, in no acute distress. The vital signs are documented above. CARDIAC: There is a regular rate and rhythm.    VASCULAR: Nonpalpable pedal pulses PULMONARY: Nonlabored respirations ABDOMEN: Soft and non-tender.  No evidence of hernia MUSCULOSKELETAL: There are no major deformities or cyanosis. NEUROLOGIC: No focal weakness or paresthesias are detected. SKIN: There are no ulcers or rashes noted. PSYCHIATRIC: The patient has a normal affect.  STUDIES:   I have reviewed the following: CAROTID: Right Carotid: Velocities in the right ICA are consistent with a 1-39% stenosis.                Non-hemodynamically significant plaque <50% noted in the CCA.  Left Carotid: Velocities in the left ICA are consistent with a 1-39% stenosis.               Non-hemodynamically significant plaque <50% noted in the CCA.  Vertebrals:  Bilateral vertebral arteries demonstrate antegrade flow. Subclavians: Normal flow hemodynamics were seen in bilateral subclavian              arteries. +-------+-----------+-----------+------------+------------+ ABI/TBIToday's ABIToday's TBIPrevious ABIPrevious TBI +-------+-----------+-----------+------------+------------+ Right  0.67       0.48       1.03                     +-------+-----------+-----------+------------+------------+ Left   0.77       0          1.00                     +-------+-----------+-----------+------------+------------+ LEFT Toe: 0 Right Toe: 33  LE DUPLEX: Right: Atherosclerosis in the common femoral, femoral, popliteal and tibial arteries. Superficial femoral artery appears to be occluded with reconstitution of flow in the distal SFA/above knee popliteal. 50-74% stenosis in the tibioperoneal trunk. Three vessel run off.  Left: Atherosclerosis in the common femoral, femoral, popliteal and tibial arteries. 50-74% stenosis in the common femoral artery. Low flow velocities in the superficial femoral artery; probable high grade stenosis versus occlusion. Three vessel run off.  AORTO-ILIAC DUPLEX: Patent aorto-bifemoral  bypass graft with no evidence of a significant stenosis. ASSESSMENT and PLAN   Atherosclerotic vascular disease: The patient reports symptoms in her feet that could be consistent with arterial insufficiency, or possibly neuropathy.  She has had a decline in her ABIs since her last study which was in 2013.  Her superficial femoral artery on the right is occluded.  She has a stenosis in the left distal common femoral artery.  She tells me that she would not be here if she could tolerate her symptoms.  Therefore I think we need further evaluation.  I believe the next step is to proceed with angiography to define her anatomy and the extent of her disease.  I will plan on a left femoral approach.  This will more than likely be a diagnostic study.  She wants to schedule this after her next chemo dose and so we have selected February 23   Annamarie Major, Dorothy Puffer, MD, [facility] Vascular and Vein Specialists of [facility] [phone] Pager [phone]

## 2019-12-12 NOTE — Telephone Encounter (Signed)
Discussed all instructions for procedure and nasal swab. (See letter) Letter of all instructions mailed to patient.

## 2019-12-13 DIAGNOSIS — Z5111 Encounter for antineoplastic chemotherapy: Secondary | ICD-10-CM | POA: Diagnosis not present

## 2019-12-13 DIAGNOSIS — C672 Malignant neoplasm of lateral wall of bladder: Secondary | ICD-10-CM | POA: Diagnosis not present

## 2019-12-20 DIAGNOSIS — Z5111 Encounter for antineoplastic chemotherapy: Secondary | ICD-10-CM | POA: Diagnosis not present

## 2019-12-20 DIAGNOSIS — C672 Malignant neoplasm of lateral wall of bladder: Secondary | ICD-10-CM | POA: Diagnosis not present

## 2020-01-06 ENCOUNTER — Other Ambulatory Visit (HOSPITAL_COMMUNITY)
Admission: RE | Admit: 2020-01-06 | Discharge: 2020-01-06 | Disposition: A | Discharge status: Home / Self Care | Payer: Medicare HMO | Source: Ambulatory Visit | Attending: Surgery | Admitting: Surgery

## 2020-01-06 DIAGNOSIS — Z01812 Encounter for preprocedural laboratory examination: Secondary | ICD-10-CM | POA: Diagnosis not present

## 2020-01-06 DIAGNOSIS — Z20822 Contact with and (suspected) exposure to covid-19: Secondary | ICD-10-CM | POA: Insufficient documentation

## 2020-01-06 LAB — SARS CORONAVIRUS 2 (TAT 6-24 HRS): SARS Coronavirus 2: NEGATIVE

## 2020-01-10 ENCOUNTER — Ambulatory Visit (HOSPITAL_COMMUNITY)
Admission: RE | Admit: 2020-01-10 | Discharge: 2020-01-10 | Disposition: A | Discharge status: Home / Self Care | Payer: Medicare HMO | Attending: Surgery | Admitting: Surgery

## 2020-01-10 ENCOUNTER — Encounter (HOSPITAL_COMMUNITY)
Admission: RE | Disposition: A | Discharge status: Home / Self Care | Payer: Self-pay | Source: Home / Self Care | Attending: Surgery

## 2020-01-10 ENCOUNTER — Other Ambulatory Visit: Payer: Self-pay

## 2020-01-10 DIAGNOSIS — Z853 Personal history of malignant neoplasm of breast: Secondary | ICD-10-CM | POA: Insufficient documentation

## 2020-01-10 DIAGNOSIS — I771 Stricture of artery: Secondary | ICD-10-CM | POA: Diagnosis not present

## 2020-01-10 DIAGNOSIS — I10 Essential (primary) hypertension: Secondary | ICD-10-CM | POA: Insufficient documentation

## 2020-01-10 DIAGNOSIS — Z823 Family history of stroke: Secondary | ICD-10-CM | POA: Insufficient documentation

## 2020-01-10 DIAGNOSIS — E7800 Pure hypercholesterolemia, unspecified: Secondary | ICD-10-CM | POA: Insufficient documentation

## 2020-01-10 DIAGNOSIS — F17210 Nicotine dependence, cigarettes, uncomplicated: Secondary | ICD-10-CM | POA: Diagnosis not present

## 2020-01-10 DIAGNOSIS — Z7902 Long term (current) use of antithrombotics/antiplatelets: Secondary | ICD-10-CM | POA: Insufficient documentation

## 2020-01-10 DIAGNOSIS — I6523 Occlusion and stenosis of bilateral carotid arteries: Secondary | ICD-10-CM | POA: Insufficient documentation

## 2020-01-10 DIAGNOSIS — Z79899 Other long term (current) drug therapy: Secondary | ICD-10-CM | POA: Insufficient documentation

## 2020-01-10 DIAGNOSIS — M79605 Pain in left leg: Secondary | ICD-10-CM | POA: Diagnosis not present

## 2020-01-10 DIAGNOSIS — C679 Malignant neoplasm of bladder, unspecified: Secondary | ICD-10-CM | POA: Diagnosis not present

## 2020-01-10 DIAGNOSIS — I70223 Atherosclerosis of native arteries of extremities with rest pain, bilateral legs: Secondary | ICD-10-CM | POA: Insufficient documentation

## 2020-01-10 DIAGNOSIS — Z8249 Family history of ischemic heart disease and other diseases of the circulatory system: Secondary | ICD-10-CM | POA: Diagnosis not present

## 2020-01-10 DIAGNOSIS — Z8673 Personal history of transient ischemic attack (TIA), and cerebral infarction without residual deficits: Secondary | ICD-10-CM | POA: Diagnosis not present

## 2020-01-10 DIAGNOSIS — Z7982 Long term (current) use of aspirin: Secondary | ICD-10-CM | POA: Diagnosis not present

## 2020-01-10 DIAGNOSIS — Z803 Family history of malignant neoplasm of breast: Secondary | ICD-10-CM | POA: Insufficient documentation

## 2020-01-10 DIAGNOSIS — Z809 Family history of malignant neoplasm, unspecified: Secondary | ICD-10-CM | POA: Insufficient documentation

## 2020-01-10 DIAGNOSIS — E785 Hyperlipidemia, unspecified: Secondary | ICD-10-CM | POA: Diagnosis not present

## 2020-01-10 DIAGNOSIS — M79604 Pain in right leg: Secondary | ICD-10-CM | POA: Diagnosis not present

## 2020-01-10 HISTORY — PX: ABDOMINAL AORTOGRAM W/LOWER EXTREMITY: CATH118223

## 2020-01-10 LAB — POCT I-STAT, CHEM 8
BUN: 12 mg/dL (ref 8–23)
Calcium, Ion: 1.2 mmol/L (ref 1.15–1.40)
Chloride: 95 mmol/L — ABNORMAL LOW (ref 98–111)
Creatinine, Ser: 0.8 mg/dL (ref 0.44–1.00)
Glucose, Bld: 86 mg/dL (ref 70–99)
HCT: 40 % (ref 36.0–46.0)
Hemoglobin: 13.6 g/dL (ref 12.0–15.0)
Potassium: 3.3 mmol/L — ABNORMAL LOW (ref 3.5–5.1)
Sodium: 139 mmol/L (ref 135–145)
TCO2: 35 mmol/L — ABNORMAL HIGH (ref 22–32)

## 2020-01-10 SURGERY — ABDOMINAL AORTOGRAM W/LOWER EXTREMITY
Anesthesia: LOCAL

## 2020-01-10 MED ORDER — LABETALOL HCL 5 MG/ML IV SOLN: 10.0000 mg | INTRAVENOUS | Status: DC | PRN

## 2020-01-10 MED ORDER — MIDAZOLAM HCL 2 MG/2ML IJ SOLN
INTRAMUSCULAR | Status: DC | PRN
  Administered 2020-01-10: 1 mg via INTRAVENOUS

## 2020-01-10 MED ORDER — FENTANYL CITRATE (PF) 100 MCG/2ML IJ SOLN
INTRAMUSCULAR | Status: DC | PRN
  Administered 2020-01-10: 50 ug via INTRAVENOUS

## 2020-01-10 MED ORDER — FENTANYL CITRATE (PF) 100 MCG/2ML IJ SOLN
INTRAMUSCULAR | Status: AC
  Filled 2020-01-10: qty 2

## 2020-01-10 MED ORDER — ONDANSETRON HCL 4 MG/2ML IJ SOLN: 4.0000 mg | Freq: Four times a day (QID) | INTRAMUSCULAR | Status: DC | PRN

## 2020-01-10 MED ORDER — HEPARIN (PORCINE) IN NACL 1000-0.9 UT/500ML-% IV SOLN
INTRAVENOUS | Status: DC | PRN
  Administered 2020-01-10: 500 mL

## 2020-01-10 MED ORDER — LIDOCAINE HCL (PF) 1 % IJ SOLN
INTRAMUSCULAR | Status: DC | PRN
  Administered 2020-01-10: 15 mL

## 2020-01-10 MED ORDER — SODIUM CHLORIDE 0.9 % IV SOLN: INTRAVENOUS | Status: DC

## 2020-01-10 MED ORDER — HEPARIN (PORCINE) IN NACL 1000-0.9 UT/500ML-% IV SOLN
INTRAVENOUS | Status: AC
  Filled 2020-01-10: qty 1000

## 2020-01-10 MED ORDER — ACETAMINOPHEN 325 MG PO TABS: 650.0000 mg | ORAL_TABLET | ORAL | Status: DC | PRN

## 2020-01-10 MED ORDER — SODIUM CHLORIDE 0.9 % IV SOLN: 250.0000 mL | INTRAVENOUS | Status: DC | PRN

## 2020-01-10 MED ORDER — MIDAZOLAM HCL 2 MG/2ML IJ SOLN
INTRAMUSCULAR | Status: AC
  Filled 2020-01-10: qty 2

## 2020-01-10 MED ORDER — ASPIRIN EC 81 MG PO TBEC: 81.0000 mg | DELAYED_RELEASE_TABLET | Freq: Every day | ORAL | Status: DC

## 2020-01-10 MED ORDER — SODIUM CHLORIDE 0.9% FLUSH: 3.0000 mL | INTRAVENOUS | Status: DC | PRN

## 2020-01-10 MED ORDER — SODIUM CHLORIDE 0.9% FLUSH: 3.0000 mL | Freq: Two times a day (BID) | INTRAVENOUS | Status: DC

## 2020-01-10 MED ORDER — LIDOCAINE HCL (PF) 1 % IJ SOLN
INTRAMUSCULAR | Status: AC
  Filled 2020-01-10: qty 30

## 2020-01-10 MED ORDER — SODIUM CHLORIDE 0.9 % WEIGHT BASED INFUSION: 1.0000 mL/kg/h | INTRAVENOUS | Status: DC

## 2020-01-10 MED ORDER — HYDRALAZINE HCL 20 MG/ML IJ SOLN: 5.0000 mg | INTRAMUSCULAR | Status: DC | PRN

## 2020-01-10 SURGICAL SUPPLY — 10 items
CATH OMNI FLUSH 5F 65CM (CATHETERS) ×1 IMPLANT
DRAPE ZERO GRAVITY STERILE (DRAPES) ×1 IMPLANT
KIT MICROPUNCTURE NIT STIFF (SHEATH) ×1 IMPLANT
KIT PV (KITS) ×2 IMPLANT
SHEATH PINNACLE 5F 10CM (SHEATH) ×1 IMPLANT
SHEATH PROBE COVER 6X72 (BAG) ×1 IMPLANT
SYR MEDRAD MARK V 150ML (SYRINGE) ×1 IMPLANT
TRANSDUCER W/STOPCOCK (MISCELLANEOUS) ×2 IMPLANT
TRAY PV CATH (CUSTOM PROCEDURE TRAY) ×2 IMPLANT
WIRE BENTSON .035X145CM (WIRE) ×1 IMPLANT

## 2020-01-10 NOTE — Discharge Instructions (Signed)
   Vascular and Vein Specialists of [facility]  Discharge Instructions  Lower Extremity Angiogram; Angioplasty/Stenting  Please refer to the following instructions for your post-procedure care. Your surgeon or physician assistant will discuss any changes with you.  Activity  Avoid lifting more than 8 pounds (1 gallons of milk) for 72 hours (3 days) after your procedure. You may walk as much as you can tolerate. It's OK to drive after 72 hours.  Bathing/Showering  You may shower the day after your procedure. If you have a bandage, you may remove it at 24- 48 hours. Clean your incision site with mild soap and water. Pat the area dry with a clean towel.  Diet  Drink plenty of fluid to stay hydrated the first 2-3 days after procedure. Resume your pre-procedure diet. There are no special food restrictions following this procedure. All patients with peripheral vascular disease should follow a low fat/low cholesterol diet. In order to heal from your surgery, it is CRITICAL to get adequate nutrition. Your body requires vitamins, minerals, and protein. Vegetables are the best source of vitamins and minerals. Vegetables also provide the perfect balance of protein. Processed food has little nutritional value, so try to avoid this.  Medications  Resume taking all of your medications unless your doctor tells you not to. If your incision is causing pain, you may take over-the-counter pain relievers such as acetaminophen (Tylenol)  Follow Up  Follow up will be arranged at the time of your procedure. You may have an office visit scheduled or may be scheduled for surgery. Ask your surgeon if you have any questions.  Please call us immediately for any of the following conditions: .Severe or worsening pain your legs or feet at rest or with walking. .Increased pain, redness, drainage at your groin puncture site. .Fever of 101 degrees or higher. .If you have any mild or slow bleeding from your puncture  site: lie down, apply firm constant pressure over the area with a piece of gauze or a clean wash cloth for 30 minutes- no peeking!, call 911 right away if you are still bleeding after 30 minutes, or if the bleeding is heavy and unmanageable.  Reduce your risk factors of vascular disease:  Stop smoking. If you would like help call [facility] at 1-800-QUIT-NOW 365 518 2579) or [place] at ([phone]. Manage your cholesterol Maintain a desired weight Control your diabetes Keep your blood pressure down  If you have any questions, please call the office at [phone]

## 2020-01-10 NOTE — Interval H&P Note (Signed)
History and Physical Interval Note:  01/10/2020 8:25 AM  [name]  has presented today for surgery, with the diagnosis of pvd.  The various methods of treatment have been discussed with the patient and family. After consideration of risks, benefits and other options for treatment, the patient has consented to  Procedure(s): LOWER EXTREMITY ANGIOGRAPHY (N/A) as a surgical intervention.  The patient's history has been reviewed, patient examined, no change in status, stable for surgery.  I have reviewed the patient's chart and labs.  Questions were answered to the patient's satisfaction.     Annamarie Major

## 2020-01-10 NOTE — Progress Notes (Signed)
Site area: Left groin a 5 french arterial sheath was removed  Site Prior to Removal:  Level 0  Pressure Applied For 15 MINUTES    Bedrest Beginning at  1040am  Manual:   Yes.    Patient Status During Pull:  stable  Post Pull Groin Site:  Level 0  Post Pull Instructions Given:  Yes.    Post Pull Pulses Present:  Yes.    Dressing Applied:  Yes.    Comments:

## 2020-01-10 NOTE — Op Note (Signed)
    Patient name: [name] MRN: [id number] DOB: 07-25-1946 Sex: female  01/10/2020 Pre-operative Diagnosis: Bilateral leg pain Post-operative diagnosis:  Same Surgeon:  Annamarie Major Procedure Performed:  1.  Ultrasound-guided access, left femoral artery  2.  Abdominal aortogram  3.  Bilateral lower extremity runoff  4.  Conscious sedation, 25 minutes   Indications: The patient is experiencing bilateral leg pain.  She has a history of an aortobifemoral bypass graft 7 years ago.  Her leg symptoms are not classic for claudication however she has had a decrease in her ABIs.  Therefore angiography was indicated.  Procedure:  The patient was identified in the holding area and taken to room 8.  The patient was then placed supine on the table and prepped and draped in the usual sterile fashion.  A time out was called.  Conscious sedation was administered with the use of IV fentanyl and Versed under continuous physician and nurse monitoring.  Heart rate, blood pressure, and oxygen saturation were continuously monitored.  Total sedation time was 25 minutes.  Ultrasound was used to evaluate the left common femoral artery.  It was patent .  A digital ultrasound image was acquired.  A micropuncture needle was used to access the left common femoral artery under ultrasound guidance.  An 018 wire was advanced without resistance and a micropuncture sheath was placed.  The 018 wire was removed and a benson wire was placed.  The micropuncture sheath was exchanged for a 5 french sheath.  An omniflush catheter was advanced over the wire to the level of L-1.  An abdominal angiogram was obtained.  Next, the cath was pulled out of aorta bifurcation bilateral was performed  Findings:   Aortogram: No significant renal artery stenosis was identified.  There is a size mismatch between the native aorta and her aortobifemoral bypass graft however there is no significant stenosis.  The limbs of the graft are widely  patent.  The distal aspect of the left limb into the common femoral artery shows approximately 70% stenosis.  Right Lower Extremity: Right common femoral profundofemoral artery are patent throughout the course.  Superficial femoral arteries occluded.  There is reconstitution of the mid thigh.  There is three-vessel runoff.  Left Lower Extremity: There is a 70% stenosis within the graft near the femoral anastomosis.  The profundofemoral artery is patent throughout its course.  The common femoral artery is patent as well.  There is a short segment occlusion of the proximal superficial femoral artery.  After this occlusion, the superficial femoral artery does appear to be patent throughout its course as well as the popliteal artery and three-vessel runoff.  Intervention: None  Impression:  #1  Patent aortobifemoral bypass graft  #2  Stenosis within the left femoral anastomosis approximately 70%.  There is also a short segment occlusion of the proximal left superficial femoral artery.  #3  Occluded right superficial femoral artery  #4  Three-vessel runoff bilaterally  #5  The patient will be brought back for consideration of a left femoral endarterectomy extending into the superficial femoral artery.  After that, we will consider whether or not she needs a right femoral-popliteal bypass   V. Annamarie Major, M.D., [facility] [place] Office: [phone] Pager:  [phone]

## 2020-01-16 ENCOUNTER — Encounter: Payer: Self-pay | Admitting: Surgery

## 2020-01-16 ENCOUNTER — Other Ambulatory Visit: Payer: Self-pay

## 2020-01-16 ENCOUNTER — Ambulatory Visit (INDEPENDENT_AMBULATORY_CARE_PROVIDER_SITE_OTHER)
Admission: RE | Admit: 2020-01-16 | Discharge: 2020-01-16 | Disposition: A | Discharge status: Home / Self Care | Payer: Medicare HMO | Source: Home / Self Care | Attending: Surgery | Admitting: Surgery

## 2020-01-16 ENCOUNTER — Ambulatory Visit: Payer: Medicare HMO | Admitting: Surgery

## 2020-01-16 VITALS — BP 114/74 | HR 82 | Temp 97.4°F | Resp 20 | Ht 63.0 in | Wt 129.0 lb

## 2020-01-16 DIAGNOSIS — I6523 Occlusion and stenosis of bilateral carotid arteries: Secondary | ICD-10-CM | POA: Diagnosis not present

## 2020-01-16 DIAGNOSIS — I10 Essential (primary) hypertension: Secondary | ICD-10-CM | POA: Diagnosis not present

## 2020-01-16 DIAGNOSIS — I70223 Atherosclerosis of native arteries of extremities with rest pain, bilateral legs: Secondary | ICD-10-CM | POA: Diagnosis not present

## 2020-01-16 DIAGNOSIS — I739 Peripheral vascular disease, unspecified: Secondary | ICD-10-CM

## 2020-01-16 DIAGNOSIS — C679 Malignant neoplasm of bladder, unspecified: Secondary | ICD-10-CM | POA: Diagnosis not present

## 2020-01-16 DIAGNOSIS — E785 Hyperlipidemia, unspecified: Secondary | ICD-10-CM | POA: Diagnosis not present

## 2020-01-16 DIAGNOSIS — Z7982 Long term (current) use of aspirin: Secondary | ICD-10-CM | POA: Diagnosis not present

## 2020-01-16 DIAGNOSIS — I771 Stricture of artery: Secondary | ICD-10-CM | POA: Diagnosis not present

## 2020-01-16 DIAGNOSIS — I70213 Atherosclerosis of native arteries of extremities with intermittent claudication, bilateral legs: Secondary | ICD-10-CM | POA: Diagnosis not present

## 2020-01-16 DIAGNOSIS — E7800 Pure hypercholesterolemia, unspecified: Secondary | ICD-10-CM | POA: Diagnosis not present

## 2020-01-16 DIAGNOSIS — F17210 Nicotine dependence, cigarettes, uncomplicated: Secondary | ICD-10-CM | POA: Diagnosis not present

## 2020-01-16 NOTE — H&P (View-Only) (Signed)
[facility] [place]  Patient name: [name] MRN: [id number] DOB: 1946/01/31 Sex: female   REASON FOR VISIT:    Follow-up  HISOTRY OF PRESENT ILLNESS:    [name] is a 74 y.o. female, who is status post aorto-bifemoral bypass in on 05-16-2011 for iiliac occlusion and claudication.  She has not been seen in our office for over 7 years.  She has recently began noticing pain and kneeling in her feet which wakes her up at night.  She is also getting cramps with ambulation.  She does not have any open wounds.  She went for angiography on 01/10/2020.  I found a 70% stenosis at the left femoral anastomosis as well as a short segment occlusion of the proximal left superficial femoral artery.  She also has an occluded right superficial femoral artery.  She is back today to discuss surgical options.  Patient is currently undergoing treatment for bladder cancer.  She is medically managed for hypertension.  She takes a statin for hypercholesterolemia.  She is on dual antiplatelet therapy.  PAST MEDICAL HISTORY:   Past Medical History:  Diagnosis Date  . Abdominal wall hernia   . Blood transfusion    30+ yrs. ago  . Cancer (HCC)    Right Breast  . Hyperlipidemia   . Hypertension   . Peripheral vascular disease ([place])   . Seasonal allergies   . TIA (transient ischemic attack)      FAMILY HISTORY:   Family History  Problem Relation Age of Onset  . Heart attack Sister   . Heart attack Brother   . Stroke Sister   . Cancer Mother        BREAST, BRAIN  . Heart disease Mother   . Cancer Paternal Aunt        breast ca  . Cancer Paternal Grandmother        unknown ca    SOCIAL HISTORY:   Social History   Tobacco Use  . Smoking status: Current Every Day Smoker    Packs/day: 0.50    Years: 30.00    Pack years: 15.00    Types: Cigarettes  . Smokeless tobacco: Never Used  . Tobacco comment: pt states that she  uses the smokeless cig and is trying to quit  Substance Use Topics  . Alcohol use: No    Comment: occasional alcohol intake     ALLERGIES:   Allergies  Allergen Reactions  . Latex Rash     CURRENT MEDICATIONS:   Current Outpatient Medications  Medication Sig Dispense Refill  . acetaminophen (TYLENOL) 500 MG tablet Take 1,000 mg by mouth every 6 (six) hours as needed for mild pain or moderate pain.    [name] aspirin EC 81 MG tablet Take 81 mg by mouth every evening.     [name] atorvastatin (LIPITOR) 20 MG tablet TAKE 1 TABLET BY MOUTH ONCE DAILY (Patient taking differently: Take 20 mg by mouth every evening. ) 90 tablet 3  . Cholecalciferol (VITAMIN D) 50 MCG (2000 UT) tablet Take 2,000 Units by mouth every evening.     . clopidogrel (PLAVIX) 75 MG tablet Take 1 tablet (75 mg total) by mouth daily. Resume once you no longer have any bleeding in your urine or in your stool and you have completed all your scheduled procedures. 90 tablet 3  . hydrochlorothiazide (HYDRODIURIL) 25 MG tablet TAKE 1 TABLET BY MOUTH EVERY DAY (Patient taking differently: Take 25 mg by mouth every evening. )  90 tablet 0  . hydroxypropyl methylcellulose / hypromellose (ISOPTO TEARS / GONIOVISC) 2.5 % ophthalmic solution Place 1 drop into both eyes daily as needed for dry eyes.    [name] OVER THE COUNTER MEDICATION Take 15 mLs by mouth at bedtime. Nature's Plus Source of Life Gold Liquid5      No current facility-administered medications for this visit.    REVIEW OF SYSTEMS:   [X]  denotes positive finding, [ ]  denotes negative finding Cardiac  Comments:  Chest pain or chest pressure:    Shortness of breath upon exertion:    Short of breath when lying flat:    Irregular heart rhythm:        Vascular    Pain in calf, thigh, or hip brought on by ambulation: x   Pain in feet at night that wakes you up from your sleep:     Blood clot in your veins:    Leg swelling:         Pulmonary    Oxygen at home:    Productive  cough:     Wheezing:         Neurologic    Sudden weakness in arms or legs:     Sudden numbness in arms or legs:     Sudden onset of difficulty speaking or slurred speech:    Temporary loss of vision in one eye:     Problems with dizziness:         Gastrointestinal    Blood in stool:     Vomited blood:         Genitourinary    Burning when urinating:     Blood in urine:        Psychiatric    Major depression:         Hematologic    Bleeding problems:    Problems with blood clotting too easily:        Skin    Rashes or ulcers:        Constitutional    Fever or chills:      PHYSICAL EXAM:   Vitals:   01/16/20 1428  BP: 114/74  Pulse: 82  Resp: 20  Temp: (!) 97.4 F (36.3 C)  SpO2: 98%  Weight: 129 lb (58.5 kg)  Height: 5\' 3"  (1.6 m)    GENERAL: The patient is a well-nourished female, in no acute distress. The vital signs are documented above. CARDIAC: There is a regular rate and rhythm.  VASCULAR: Palpable femoral pulses PULMONARY: Non-labored respirations ABDOMEN: Soft and non-tender with normal pitched bowel sounds.  MUSCULOSKELETAL: There are no major deformities or cyanosis. NEUROLOGIC: No focal weakness or paresthesias are detected. SKIN: There are no ulcers or rashes noted. PSYCHIATRIC: The patient has a normal affect.  STUDIES:   I have reviewed the following carotid duplex: Right Carotid: Velocities in the right ICA are consistent with a 1-39%  stenosis.   Left Carotid: Velocities in the left ICA are consistent with a 1-39%  stenosis.   Vertebrals: Bilateral vertebral arteries demonstrate antegrade flow.  Subclavians: Normal flow hemodynamics were seen in bilateral subclavian  MEDICAL ISSUES:   Bilateral claudication left greater than right: I reviewed the patient's arteriogram with her.  My biggest concern is her left leg.  She has a 70% anastomotic stenosis and a short segment occlusion of her superficial femoral artery.  Although I do not  think her symptoms warrant surgical intervention my concern is that without addressing the stenosis in her left  groin she is putting her aortobifemoral bypass graft at risk.  I discussed proceeding with redo endarterectomy of the left groin extending onto the superficial femoral artery.  We discussed the risks of the operation including the risk of wound complications.  She will need to be off her Plavix for 5 days prior to her operation.  I am going to speak with Dr. Louis Meckel to determine the most appropriate time to schedule this given that she is getting chemo cycles every 3 months.    Leia Alf, MD, FACS [facility] [phone] Pager [phone]

## 2020-01-16 NOTE — Progress Notes (Signed)
[facility] [place]  Patient name: [name] MRN: [id number] DOB: 11/06/1946 Sex: female   REASON FOR VISIT:    Follow-up  HISOTRY OF PRESENT ILLNESS:    [name] is a 74 y.o. female, who is status post aorto-bifemoral bypass in on 05-16-2011 for iiliac occlusion and claudication.  She has not been seen in our office for over 7 years.  She has recently began noticing pain and kneeling in her feet which wakes her up at night.  She is also getting cramps with ambulation.  She does not have any open wounds.  She went for angiography on 01/10/2020.  I found a 70% stenosis at the left femoral anastomosis as well as a short segment occlusion of the proximal left superficial femoral artery.  She also has an occluded right superficial femoral artery.  She is back today to discuss surgical options.  Patient is currently undergoing treatment for bladder cancer.  She is medically managed for hypertension.  She takes a statin for hypercholesterolemia.  She is on dual antiplatelet therapy.  PAST MEDICAL HISTORY:   Past Medical History:  Diagnosis Date  . Abdominal wall hernia   . Blood transfusion    30+ yrs. ago  . Cancer (HCC)    Right Breast  . Hyperlipidemia   . Hypertension   . Peripheral vascular disease ([place])   . Seasonal allergies   . TIA (transient ischemic attack)      FAMILY HISTORY:   Family History  Problem Relation Age of Onset  . Heart attack Sister   . Heart attack Brother   . Stroke Sister   . Cancer Mother        BREAST, BRAIN  . Heart disease Mother   . Cancer Paternal Aunt        breast ca  . Cancer Paternal Grandmother        unknown ca    SOCIAL HISTORY:   Social History   Tobacco Use  . Smoking status: Current Every Day Smoker    Packs/day: 0.50    Years: 30.00    Pack years: 15.00    Types: Cigarettes  . Smokeless tobacco: Never Used  . Tobacco comment: pt states that she  uses the smokeless cig and is trying to quit  Substance Use Topics  . Alcohol use: No    Comment: occasional alcohol intake     ALLERGIES:   Allergies  Allergen Reactions  . Latex Rash     CURRENT MEDICATIONS:   Current Outpatient Medications  Medication Sig Dispense Refill  . acetaminophen (TYLENOL) 500 MG tablet Take 1,000 mg by mouth every 6 (six) hours as needed for mild pain or moderate pain.    [name] aspirin EC 81 MG tablet Take 81 mg by mouth every evening.     [name] atorvastatin (LIPITOR) 20 MG tablet TAKE 1 TABLET BY MOUTH ONCE DAILY (Patient taking differently: Take 20 mg by mouth every evening. ) 90 tablet 3  . Cholecalciferol (VITAMIN D) 50 MCG (2000 UT) tablet Take 2,000 Units by mouth every evening.     . clopidogrel (PLAVIX) 75 MG tablet Take 1 tablet (75 mg total) by mouth daily. Resume once you no longer have any bleeding in your urine or in your stool and you have completed all your scheduled procedures. 90 tablet 3  . hydrochlorothiazide (HYDRODIURIL) 25 MG tablet TAKE 1 TABLET BY MOUTH EVERY DAY (Patient taking differently: Take 25 mg by mouth every evening. )  90 tablet 0  . hydroxypropyl methylcellulose / hypromellose (ISOPTO TEARS / GONIOVISC) 2.5 % ophthalmic solution Place 1 drop into both eyes daily as needed for dry eyes.    [name] OVER THE COUNTER MEDICATION Take 15 mLs by mouth at bedtime. Nature's Plus Source of Life Gold Liquid5      No current facility-administered medications for this visit.    REVIEW OF SYSTEMS:   [X]  denotes positive finding, [ ]  denotes negative finding Cardiac  Comments:  Chest pain or chest pressure:    Shortness of breath upon exertion:    Short of breath when lying flat:    Irregular heart rhythm:        Vascular    Pain in calf, thigh, or hip brought on by ambulation: x   Pain in feet at night that wakes you up from your sleep:     Blood clot in your veins:    Leg swelling:         Pulmonary    Oxygen at home:    Productive  cough:     Wheezing:         Neurologic    Sudden weakness in arms or legs:     Sudden numbness in arms or legs:     Sudden onset of difficulty speaking or slurred speech:    Temporary loss of vision in one eye:     Problems with dizziness:         Gastrointestinal    Blood in stool:     Vomited blood:         Genitourinary    Burning when urinating:     Blood in urine:        Psychiatric    Major depression:         Hematologic    Bleeding problems:    Problems with blood clotting too easily:        Skin    Rashes or ulcers:        Constitutional    Fever or chills:      PHYSICAL EXAM:   Vitals:   01/16/20 1428  BP: 114/74  Pulse: 82  Resp: 20  Temp: (!) 97.4 F (36.3 C)  SpO2: 98%  Weight: 129 lb (58.5 kg)  Height: 5\' 3"  (1.6 m)    GENERAL: The patient is a well-nourished female, in no acute distress. The vital signs are documented above. CARDIAC: There is a regular rate and rhythm.  VASCULAR: Palpable femoral pulses PULMONARY: Non-labored respirations ABDOMEN: Soft and non-tender with normal pitched bowel sounds.  MUSCULOSKELETAL: There are no major deformities or cyanosis. NEUROLOGIC: No focal weakness or paresthesias are detected. SKIN: There are no ulcers or rashes noted. PSYCHIATRIC: The patient has a normal affect.  STUDIES:   I have reviewed the following carotid duplex: Right Carotid: Velocities in the right ICA are consistent with a 1-39%  stenosis.   Left Carotid: Velocities in the left ICA are consistent with a 1-39%  stenosis.   Vertebrals: Bilateral vertebral arteries demonstrate antegrade flow.  Subclavians: Normal flow hemodynamics were seen in bilateral subclavian  MEDICAL ISSUES:   Bilateral claudication left greater than right: I reviewed the patient's arteriogram with her.  My biggest concern is her left leg.  She has a 70% anastomotic stenosis and a short segment occlusion of her superficial femoral artery.  Although I do not  think her symptoms warrant surgical intervention my concern is that without addressing the stenosis in her left  groin she is putting her aortobifemoral bypass graft at risk.  I discussed proceeding with redo endarterectomy of the left groin extending onto the superficial femoral artery.  We discussed the risks of the operation including the risk of wound complications.  She will need to be off her Plavix for 5 days prior to her operation.  I am going to speak with Dr. Louis Meckel to determine the most appropriate time to schedule this given that she is getting chemo cycles every 3 months.    Leia Alf, MD, FACS [facility] [phone] Pager [phone]

## 2020-01-18 ENCOUNTER — Other Ambulatory Visit: Payer: Self-pay

## 2020-01-24 NOTE — Progress Notes (Signed)
[facility] [phone] - [place], [place] - [address] [place] [address] Phone: [phone] Fax: [phone]      Your procedure is scheduled on Friday January 27, 2020.  Report to [facility] Main Entrance "A" at 07:45 A.M., and check in at the Admitting office.  Call this number if you have problems the morning of surgery:  [phone]  Call [phone] if you have any questions prior to your surgery date Monday-Friday 8am-4pm    Remember:  Do not eat or drink after midnight the night before your surgery   Take these medicines as needed the morning of surgery with A SIP OF WATER:  acetaminophen (TYLENOL) - as needed  simethicone (MYLICON) - as needed  Phenazopyridine HCl (AZO STANDARD MAXIMUM STRENGTH PO) - as needed  >>Follow your surgeon's instructions on when to stop Aspirin and clopidogrel (PLAVIX).  If no instructions were given by your surgeon then you will need to call the office to get those instructions.     As of today, STOP taking any Aleve, Naproxen, Ibuprofen, Motrin, Advil, Goody's, BC's, all herbal medications, fish oil, and all vitamins.    The Morning of Surgery  Do not wear jewelry, make-up or nail polish.  Do not wear lotions, powders, perfumes, or deodorant  Do not shave 48 hours prior to surgery.   Do not bring valuables to the hospital.  [facility] is not responsible for any belongings or valuables.  If you are a smoker, DO NOT Smoke 24 hours prior to surgery  If you wear a CPAP at night please bring your mask the morning of surgery   Remember that you must have someone to transport you home after your surgery, and remain with you for 24 hours if you are discharged the same day.   Please bring cases for contacts, glasses, hearing aids, dentures or bridgework because it cannot be worn into surgery.    Leave your suitcase in the car.  After surgery it may be brought to  your room.  For patients admitted to the hospital, discharge time will be determined by your treatment team.  Patients discharged the day of surgery will not be allowed to drive home.    Special instructions:   [place]- Preparing For Surgery  Before surgery, you can play an important role. Because skin is not sterile, your skin needs to be as free of germs as possible. You can reduce the number of germs on your skin by washing with CHG (chlorahexidine gluconate) Soap before surgery.  CHG is an antiseptic cleaner which kills germs and bonds with the skin to continue killing germs even after washing.    Oral Hygiene is also important to reduce your risk of infection.  Remember - BRUSH YOUR TEETH THE MORNING OF SURGERY WITH YOUR REGULAR TOOTHPASTE  Please do not use if you have an allergy to CHG or antibacterial soaps. If your skin becomes reddened/irritated stop using the CHG.  Do not shave (including legs and underarms) for at least 48 hours prior to first CHG shower. It is OK to shave your face.  Please follow these instructions carefully.   1. Shower the NIGHT BEFORE SURGERY and the MORNING OF SURGERY with CHG Soap.   2. If you chose to wash your hair, wash your hair first as usual with your normal shampoo.  3. After you shampoo, rinse your hair and body thoroughly to remove the shampoo.  4.  Use CHG as you would any other liquid soap. You can apply CHG directly to the skin and wash gently with a scrungie or a clean washcloth.   5. Apply the CHG Soap to your body ONLY FROM THE NECK DOWN.  Do not use on open wounds or open sores. Avoid contact with your eyes, ears, mouth and genitals (private parts). Wash Face and genitals (private parts)  with your normal soap.   6. Wash thoroughly, paying special attention to the area where your surgery will be performed.  7. Thoroughly rinse your body with warm water from the neck down.  8. DO NOT shower/wash with your normal soap after using  and rinsing off the CHG Soap.  9. Pat yourself dry with a CLEAN TOWEL.  10. Wear CLEAN PAJAMAS to bed the night before surgery, wear comfortable clothes the morning of surgery  11. Place CLEAN SHEETS on your bed the night of your first shower and DO NOT SLEEP WITH PETS.    Day of Surgery:  Please shower the morning of surgery with the CHG soap Do not apply any deodorants/lotions. Please wear clean clothes to the hospital/surgery center.   Remember to brush your teeth WITH YOUR REGULAR TOOTHPASTE.   Please read over the following fact sheets that you were given.

## 2020-01-25 ENCOUNTER — Other Ambulatory Visit: Payer: Self-pay

## 2020-01-25 ENCOUNTER — Encounter (HOSPITAL_COMMUNITY)
Admission: RE | Admit: 2020-01-25 | Discharge: 2020-01-25 | Disposition: A | Discharge status: Home / Self Care | Payer: Medicare HMO | Source: Ambulatory Visit | Attending: Surgery | Admitting: Surgery

## 2020-01-25 ENCOUNTER — Encounter (HOSPITAL_COMMUNITY): Payer: Self-pay

## 2020-01-25 DIAGNOSIS — Y832 Surgical operation with anastomosis, bypass or graft as the cause of abnormal reaction of the patient, or of later complication, without mention of misadventure at the time of the procedure: Secondary | ICD-10-CM | POA: Diagnosis present

## 2020-01-25 DIAGNOSIS — Z79899 Other long term (current) drug therapy: Secondary | ICD-10-CM | POA: Diagnosis not present

## 2020-01-25 DIAGNOSIS — Z823 Family history of stroke: Secondary | ICD-10-CM | POA: Diagnosis not present

## 2020-01-25 DIAGNOSIS — I771 Stricture of artery: Secondary | ICD-10-CM | POA: Diagnosis not present

## 2020-01-25 DIAGNOSIS — Z853 Personal history of malignant neoplasm of breast: Secondary | ICD-10-CM | POA: Diagnosis not present

## 2020-01-25 DIAGNOSIS — E7800 Pure hypercholesterolemia, unspecified: Secondary | ICD-10-CM | POA: Diagnosis present

## 2020-01-25 DIAGNOSIS — C679 Malignant neoplasm of bladder, unspecified: Secondary | ICD-10-CM | POA: Diagnosis present

## 2020-01-25 DIAGNOSIS — Z8249 Family history of ischemic heart disease and other diseases of the circulatory system: Secondary | ICD-10-CM | POA: Diagnosis not present

## 2020-01-25 DIAGNOSIS — Z7982 Long term (current) use of aspirin: Secondary | ICD-10-CM | POA: Diagnosis not present

## 2020-01-25 DIAGNOSIS — F17210 Nicotine dependence, cigarettes, uncomplicated: Secondary | ICD-10-CM | POA: Diagnosis present

## 2020-01-25 DIAGNOSIS — I739 Peripheral vascular disease, unspecified: Secondary | ICD-10-CM | POA: Diagnosis present

## 2020-01-25 DIAGNOSIS — I6529 Occlusion and stenosis of unspecified carotid artery: Secondary | ICD-10-CM | POA: Diagnosis not present

## 2020-01-25 DIAGNOSIS — Z803 Family history of malignant neoplasm of breast: Secondary | ICD-10-CM | POA: Diagnosis not present

## 2020-01-25 DIAGNOSIS — T82858A Stenosis of vascular prosthetic devices, implants and grafts, initial encounter: Secondary | ICD-10-CM | POA: Diagnosis present

## 2020-01-25 DIAGNOSIS — Z7902 Long term (current) use of antithrombotics/antiplatelets: Secondary | ICD-10-CM | POA: Diagnosis not present

## 2020-01-25 DIAGNOSIS — Z91040 Latex allergy status: Secondary | ICD-10-CM | POA: Diagnosis not present

## 2020-01-25 DIAGNOSIS — I10 Essential (primary) hypertension: Secondary | ICD-10-CM | POA: Diagnosis present

## 2020-01-25 DIAGNOSIS — J302 Other seasonal allergic rhinitis: Secondary | ICD-10-CM | POA: Diagnosis present

## 2020-01-25 DIAGNOSIS — E785 Hyperlipidemia, unspecified: Secondary | ICD-10-CM | POA: Diagnosis present

## 2020-01-25 DIAGNOSIS — Z20822 Contact with and (suspected) exposure to covid-19: Secondary | ICD-10-CM | POA: Diagnosis present

## 2020-01-25 DIAGNOSIS — G459 Transient cerebral ischemic attack, unspecified: Secondary | ICD-10-CM | POA: Diagnosis not present

## 2020-01-25 DIAGNOSIS — Z8673 Personal history of transient ischemic attack (TIA), and cerebral infarction without residual deficits: Secondary | ICD-10-CM | POA: Diagnosis not present

## 2020-01-25 LAB — URINALYSIS, ROUTINE W REFLEX MICROSCOPIC
Bilirubin Urine: NEGATIVE
Glucose, UA: NEGATIVE mg/dL
Ketones, ur: 5 mg/dL — AB
Leukocytes,Ua: NEGATIVE
Nitrite: NEGATIVE
Protein, ur: 30 mg/dL — AB
Specific Gravity, Urine: 1.027 (ref 1.005–1.030)
pH: 5 (ref 5.0–8.0)

## 2020-01-25 LAB — COMPREHENSIVE METABOLIC PANEL
ALT: 28 U/L (ref 0–44)
AST: 34 U/L (ref 15–41)
Albumin: 3.5 g/dL (ref 3.5–5.0)
Alkaline Phosphatase: 102 U/L (ref 38–126)
Anion gap: 12 (ref 5–15)
BUN: 14 mg/dL (ref 8–23)
CO2: 30 mmol/L (ref 22–32)
Calcium: 9.4 mg/dL (ref 8.9–10.3)
Chloride: 100 mmol/L (ref 98–111)
Creatinine, Ser: 0.89 mg/dL (ref 0.44–1.00)
GFR calc Af Amer: >60 mL/min (ref >60)
GFR calc non Af Amer: >60 mL/min (ref >60)
Glucose, Bld: 92 mg/dL (ref 70–99)
Potassium: 3.6 mmol/L (ref 3.5–5.1)
Sodium: 142 mmol/L (ref 135–145)
Total Bilirubin: 0.5 mg/dL (ref 0.3–1.2)
Total Protein: 7.5 g/dL (ref 6.5–8.1)

## 2020-01-25 LAB — CBC
HCT: 37.3 % (ref 36.0–46.0)
Hemoglobin: 11.6 g/dL — ABNORMAL LOW (ref 12.0–15.0)
MCH: 27.8 pg (ref 26.0–34.0)
MCHC: 31.1 g/dL (ref 30.0–36.0)
MCV: 89.4 fL (ref 80.0–100.0)
Platelets: 217 10*3/uL (ref 150–400)
RBC: 4.17 MIL/uL (ref 3.87–5.11)
RDW: 17.2 % — ABNORMAL HIGH (ref 11.5–15.5)
WBC: 7 10*3/uL (ref 4.0–10.5)
nRBC: 0 % (ref 0.0–0.2)

## 2020-01-25 LAB — PROTIME-INR
INR: 0.9 (ref 0.8–1.2)
Prothrombin Time: 11.7 seconds (ref 11.4–15.2)

## 2020-01-25 LAB — TYPE AND SCREEN
ABO/RH(D): B POS
Antibody Screen: NEGATIVE

## 2020-01-25 LAB — SURGICAL PCR SCREEN
MRSA, PCR: NEGATIVE
Staphylococcus aureus: NEGATIVE

## 2020-01-25 LAB — APTT: aPTT: 30 seconds (ref 24–36)

## 2020-01-25 NOTE — Progress Notes (Signed)
PCP - Jill Alexanders, MD Cardiologist - pt denies   Chest x-ray - n/a EKG - 05/25/19 Stress Test - pt denies ECHO - 2014 Cardiac Cath - pt denies  Blood Thinner Instructions: see below Aspirin Instructions: see below  Follow your surgeon's instructions on when to stop Aspirin and Plavix.  If no instructions were given by your surgeon then you will need to call the office to get those instructions.    Per pt, per surgeons instructions, her last dose of Plavix was 01/22/20. "No one told me anything about stopping Aspirin, but I can call to ask."     COVID TEST- pt states she plans to go 01/26/20. Pt verbalizes understanding of self-quarantine after covid testing.    Anesthesia review: n/a  Patient denies shortness of breath, fever, cough and chest pain at PAT appointment   All instructions explained to the patient, with a verbal understanding of the material. Patient agrees to go over the instructions while at home for a better understanding. Patient also instructed to self quarantine after being tested for COVID-19. The opportunity to ask questions was provided.

## 2020-01-26 ENCOUNTER — Other Ambulatory Visit (HOSPITAL_COMMUNITY)
Admission: RE | Admit: 2020-01-26 | Discharge: 2020-01-26 | Disposition: A | Discharge status: Home / Self Care | Payer: Medicare HMO | Source: Ambulatory Visit | Attending: Surgery | Admitting: Surgery

## 2020-01-26 ENCOUNTER — Encounter (HOSPITAL_COMMUNITY): Payer: Self-pay | Admitting: Surgery

## 2020-01-26 LAB — SARS CORONAVIRUS 2 (TAT 6-24 HRS): SARS Coronavirus 2: NEGATIVE

## 2020-01-26 NOTE — Anesthesia Preprocedure Evaluation (Addendum)
Anesthesia Evaluation  Patient identified by MRN, date of birth, ID band Patient awake    Reviewed: Allergy & Precautions, NPO status , Patient's Chart, lab work & pertinent test results  Airway Mallampati: II       Dental   Pulmonary former smoker,    breath sounds clear to auscultation       Cardiovascular hypertension, + Peripheral Vascular Disease   Rhythm:Regular Rate:Normal     Neuro/Psych TIA   GI/Hepatic negative GI ROS, Neg liver ROS,   Endo/Other  negative endocrine ROS  Renal/GU negative Renal ROS     Musculoskeletal   Abdominal   Peds  Hematology   Anesthesia Other Findings   Reproductive/Obstetrics                            Anesthesia Physical Anesthesia Plan  ASA: III  Anesthesia Plan: General   Post-op Pain Management:    Induction: Intravenous  PONV Risk Score and Plan: 2 and Ondansetron and Dexamethasone  Airway Management Planned: Oral ETT  Additional Equipment:   Intra-op Plan:   Post-operative Plan: Possible Post-op intubation/ventilation  Informed Consent: I have reviewed the patients History and Physical, chart, labs and discussed the procedure including the risks, benefits and alternatives for the proposed anesthesia with the patient or authorized representative who has indicated his/her understanding and acceptance.     Dental advisory given  Plan Discussed with: Anesthesiologist and CRNA  Anesthesia Plan Comments:        Anesthesia Quick Evaluation

## 2020-01-27 ENCOUNTER — Inpatient Hospital Stay (HOSPITAL_COMMUNITY): Payer: Medicare HMO | Admitting: Certified Registered Nurse Anesthetist

## 2020-01-27 ENCOUNTER — Encounter (HOSPITAL_COMMUNITY): Payer: Self-pay | Admitting: Surgery

## 2020-01-27 ENCOUNTER — Other Ambulatory Visit: Payer: Self-pay

## 2020-01-27 ENCOUNTER — Inpatient Hospital Stay (HOSPITAL_COMMUNITY)
Admission: RE | Admit: 2020-01-27 | Discharge: 2020-01-28 | DRG: 254 | Disposition: A | Discharge status: Home / Self Care | Payer: Medicare HMO | Attending: Surgery | Admitting: Surgery

## 2020-01-27 ENCOUNTER — Encounter (HOSPITAL_COMMUNITY)
Admission: RE | Disposition: A | Discharge status: Home / Self Care | Payer: Self-pay | Source: Home / Self Care | Attending: Surgery

## 2020-01-27 DIAGNOSIS — Z823 Family history of stroke: Secondary | ICD-10-CM

## 2020-01-27 DIAGNOSIS — Z7982 Long term (current) use of aspirin: Secondary | ICD-10-CM | POA: Diagnosis not present

## 2020-01-27 DIAGNOSIS — E785 Hyperlipidemia, unspecified: Secondary | ICD-10-CM | POA: Diagnosis present

## 2020-01-27 DIAGNOSIS — Y832 Surgical operation with anastomosis, bypass or graft as the cause of abnormal reaction of the patient, or of later complication, without mention of misadventure at the time of the procedure: Secondary | ICD-10-CM | POA: Diagnosis present

## 2020-01-27 DIAGNOSIS — E7800 Pure hypercholesterolemia, unspecified: Secondary | ICD-10-CM | POA: Diagnosis present

## 2020-01-27 DIAGNOSIS — I771 Stricture of artery: Secondary | ICD-10-CM | POA: Diagnosis not present

## 2020-01-27 DIAGNOSIS — I10 Essential (primary) hypertension: Secondary | ICD-10-CM | POA: Diagnosis present

## 2020-01-27 DIAGNOSIS — Z20822 Contact with and (suspected) exposure to covid-19: Secondary | ICD-10-CM | POA: Diagnosis present

## 2020-01-27 DIAGNOSIS — Z79899 Other long term (current) drug therapy: Secondary | ICD-10-CM

## 2020-01-27 DIAGNOSIS — Z803 Family history of malignant neoplasm of breast: Secondary | ICD-10-CM | POA: Diagnosis not present

## 2020-01-27 DIAGNOSIS — F17210 Nicotine dependence, cigarettes, uncomplicated: Secondary | ICD-10-CM | POA: Diagnosis present

## 2020-01-27 DIAGNOSIS — Z8673 Personal history of transient ischemic attack (TIA), and cerebral infarction without residual deficits: Secondary | ICD-10-CM

## 2020-01-27 DIAGNOSIS — J302 Other seasonal allergic rhinitis: Secondary | ICD-10-CM | POA: Diagnosis present

## 2020-01-27 DIAGNOSIS — T82858A Stenosis of vascular prosthetic devices, implants and grafts, initial encounter: Secondary | ICD-10-CM | POA: Diagnosis present

## 2020-01-27 DIAGNOSIS — Z853 Personal history of malignant neoplasm of breast: Secondary | ICD-10-CM

## 2020-01-27 DIAGNOSIS — Z8249 Family history of ischemic heart disease and other diseases of the circulatory system: Secondary | ICD-10-CM | POA: Diagnosis not present

## 2020-01-27 DIAGNOSIS — I739 Peripheral vascular disease, unspecified: Secondary | ICD-10-CM | POA: Diagnosis present

## 2020-01-27 DIAGNOSIS — Z7902 Long term (current) use of antithrombotics/antiplatelets: Secondary | ICD-10-CM | POA: Diagnosis not present

## 2020-01-27 DIAGNOSIS — Z91040 Latex allergy status: Secondary | ICD-10-CM

## 2020-01-27 DIAGNOSIS — C679 Malignant neoplasm of bladder, unspecified: Secondary | ICD-10-CM | POA: Diagnosis present

## 2020-01-27 HISTORY — PX: PATCH ANGIOPLASTY: SHX6230

## 2020-01-27 HISTORY — PX: ENDARTERECTOMY FEMORAL: SHX5804

## 2020-01-27 SURGERY — ENDARTERECTOMY, FEMORAL
Anesthesia: General | Laterality: Left

## 2020-01-27 MED ORDER — FENTANYL CITRATE (PF) 100 MCG/2ML IJ SOLN
25.0000 ug | INTRAMUSCULAR | Status: DC | PRN
  Administered 2020-01-27 (×4): 25 ug via INTRAVENOUS

## 2020-01-27 MED ORDER — SUGAMMADEX SODIUM 200 MG/2ML IV SOLN
INTRAVENOUS | Status: DC | PRN
  Administered 2020-01-27 (×3): 50 mg via INTRAVENOUS

## 2020-01-27 MED ORDER — ONDANSETRON HCL 4 MG/2ML IJ SOLN
INTRAMUSCULAR | Status: DC | PRN
  Administered 2020-01-27: 4 mg via INTRAVENOUS

## 2020-01-27 MED ORDER — SODIUM CHLORIDE 0.9 % IV SOLN: 500.0000 mL | Freq: Once | INTRAVENOUS | Status: DC | PRN

## 2020-01-27 MED ORDER — SODIUM CHLORIDE 0.9 % IV SOLN
INTRAVENOUS | Status: DC | PRN
  Administered 2020-01-27: 500 mL

## 2020-01-27 MED ORDER — PHENYLEPHRINE HCL-NACL 10-0.9 MG/250ML-% IV SOLN
INTRAVENOUS | Status: DC | PRN
  Administered 2020-01-27: 10 ug/min via INTRAVENOUS

## 2020-01-27 MED ORDER — METOPROLOL TARTRATE 5 MG/5ML IV SOLN: 2.0000 mg | INTRAVENOUS | Status: DC | PRN

## 2020-01-27 MED ORDER — BISACODYL 5 MG PO TBEC: 5.0000 mg | DELAYED_RELEASE_TABLET | Freq: Every day | ORAL | Status: DC | PRN

## 2020-01-27 MED ORDER — HEMOSTATIC AGENTS (NO CHARGE) OPTIME
TOPICAL | Status: DC | PRN
  Administered 2020-01-27: 1 via TOPICAL

## 2020-01-27 MED ORDER — MAGNESIUM SULFATE 2 GM/50ML IV SOLN: 2.0000 g | Freq: Every day | INTRAVENOUS | Status: DC | PRN

## 2020-01-27 MED ORDER — HYDRALAZINE HCL 20 MG/ML IJ SOLN: 5.0000 mg | INTRAMUSCULAR | Status: DC | PRN

## 2020-01-27 MED ORDER — ASPIRIN EC 81 MG PO TBEC
81.0000 mg | DELAYED_RELEASE_TABLET | Freq: Every evening | ORAL | Status: DC
  Administered 2020-01-27: 81 mg via ORAL
  Filled 2020-01-27: qty 1

## 2020-01-27 MED ORDER — MIDAZOLAM HCL 2 MG/2ML IJ SOLN
INTRAMUSCULAR | Status: AC
  Filled 2020-01-27: qty 2

## 2020-01-27 MED ORDER — ALUM & MAG HYDROXIDE-SIMETH 200-200-20 MG/5ML PO SUSP: 15.0000 mL | ORAL | Status: DC | PRN

## 2020-01-27 MED ORDER — LACTATED RINGERS IV SOLN: INTRAVENOUS | Status: DC

## 2020-01-27 MED ORDER — SODIUM CHLORIDE 0.9 % IV SOLN
INTRAVENOUS | Status: AC
  Filled 2020-01-27: qty 1.2

## 2020-01-27 MED ORDER — HEPARIN SODIUM (PORCINE) 1000 UNIT/ML IJ SOLN
INTRAMUSCULAR | Status: DC | PRN
  Administered 2020-01-27: 7000 [IU] via INTRAVENOUS

## 2020-01-27 MED ORDER — HYDROCHLOROTHIAZIDE 25 MG PO TABS
25.0000 mg | ORAL_TABLET | Freq: Every day | ORAL | Status: DC
  Administered 2020-01-27 – 2020-01-28 (×2): 25 mg via ORAL
  Filled 2020-01-27 (×2): qty 1

## 2020-01-27 MED ORDER — PROPOFOL 10 MG/ML IV BOLUS
INTRAVENOUS | Status: AC
  Filled 2020-01-27: qty 20

## 2020-01-27 MED ORDER — ACETAMINOPHEN 325 MG RE SUPP: 325.0000 mg | RECTAL | Status: DC | PRN

## 2020-01-27 MED ORDER — PANTOPRAZOLE SODIUM 40 MG PO TBEC
40.0000 mg | DELAYED_RELEASE_TABLET | Freq: Every day | ORAL | Status: DC
  Administered 2020-01-27 – 2020-01-28 (×2): 40 mg via ORAL
  Filled 2020-01-27 (×2): qty 1

## 2020-01-27 MED ORDER — DEXAMETHASONE SODIUM PHOSPHATE 10 MG/ML IJ SOLN
INTRAMUSCULAR | Status: DC | PRN
  Administered 2020-01-27: 10 mg via INTRAVENOUS

## 2020-01-27 MED ORDER — CEFAZOLIN SODIUM-DEXTROSE 2-4 GM/100ML-% IV SOLN
2.0000 g | Freq: Three times a day (TID) | INTRAVENOUS | Status: AC
  Administered 2020-01-27 – 2020-01-28 (×2): 2 g via INTRAVENOUS
  Filled 2020-01-27 (×2): qty 100

## 2020-01-27 MED ORDER — PROTAMINE SULFATE 10 MG/ML IV SOLN
INTRAVENOUS | Status: AC
  Filled 2020-01-27: qty 25

## 2020-01-27 MED ORDER — ROCURONIUM BROMIDE 10 MG/ML (PF) SYRINGE
PREFILLED_SYRINGE | INTRAVENOUS | Status: DC | PRN
  Administered 2020-01-27: 50 mg via INTRAVENOUS
  Administered 2020-01-27: 10 mg via INTRAVENOUS

## 2020-01-27 MED ORDER — CHLORHEXIDINE GLUCONATE CLOTH 2 % EX PADS: 6.0000 | MEDICATED_PAD | Freq: Once | CUTANEOUS | Status: DC

## 2020-01-27 MED ORDER — PHENOL 1.4 % MT LIQD: 1.0000 | OROMUCOSAL | Status: DC | PRN

## 2020-01-27 MED ORDER — PROPOFOL 10 MG/ML IV BOLUS
INTRAVENOUS | Status: DC | PRN
  Administered 2020-01-27: 100 mg via INTRAVENOUS
  Administered 2020-01-27: 50 mg via INTRAVENOUS

## 2020-01-27 MED ORDER — DEXAMETHASONE SODIUM PHOSPHATE 10 MG/ML IJ SOLN
INTRAMUSCULAR | Status: AC
  Filled 2020-01-27: qty 1

## 2020-01-27 MED ORDER — SENNOSIDES-DOCUSATE SODIUM 8.6-50 MG PO TABS: 1.0000 | ORAL_TABLET | Freq: Every evening | ORAL | Status: DC | PRN

## 2020-01-27 MED ORDER — GUAIFENESIN-DM 100-10 MG/5ML PO SYRP: 15.0000 mL | ORAL_SOLUTION | ORAL | Status: DC | PRN

## 2020-01-27 MED ORDER — POTASSIUM CHLORIDE CRYS ER 20 MEQ PO TBCR: 20.0000 meq | EXTENDED_RELEASE_TABLET | Freq: Every day | ORAL | Status: DC | PRN

## 2020-01-27 MED ORDER — CEFAZOLIN SODIUM-DEXTROSE 2-4 GM/100ML-% IV SOLN
2.0000 g | INTRAVENOUS | Status: AC
  Administered 2020-01-27: 2 g via INTRAVENOUS

## 2020-01-27 MED ORDER — OXYCODONE HCL 5 MG PO TABS
5.0000 mg | ORAL_TABLET | ORAL | Status: DC | PRN
  Administered 2020-01-27: 10 mg via ORAL
  Filled 2020-01-27: qty 2

## 2020-01-27 MED ORDER — FENTANYL CITRATE (PF) 100 MCG/2ML IJ SOLN
INTRAMUSCULAR | Status: AC
  Filled 2020-01-27: qty 2

## 2020-01-27 MED ORDER — ROCURONIUM BROMIDE 10 MG/ML (PF) SYRINGE
PREFILLED_SYRINGE | INTRAVENOUS | Status: AC
  Filled 2020-01-27: qty 10

## 2020-01-27 MED ORDER — LIDOCAINE 2% (20 MG/ML) 5 ML SYRINGE
INTRAMUSCULAR | Status: AC
  Filled 2020-01-27: qty 5

## 2020-01-27 MED ORDER — HYPROMELLOSE (GONIOSCOPIC) 2.5 % OP SOLN
1.0000 [drp] | Freq: Four times a day (QID) | OPHTHALMIC | Status: DC | PRN
  Filled 2020-01-27: qty 15

## 2020-01-27 MED ORDER — 0.9 % SODIUM CHLORIDE (POUR BTL) OPTIME
TOPICAL | Status: DC | PRN
  Administered 2020-01-27: 2000 mL

## 2020-01-27 MED ORDER — MORPHINE SULFATE (PF) 2 MG/ML IV SOLN
0.5000 mg | INTRAVENOUS | Status: DC | PRN
  Administered 2020-01-27: 18:00:00 1 mg via INTRAVENOUS
  Filled 2020-01-27: qty 1

## 2020-01-27 MED ORDER — CEFAZOLIN SODIUM-DEXTROSE 2-4 GM/100ML-% IV SOLN
INTRAVENOUS | Status: AC
  Filled 2020-01-27: qty 100

## 2020-01-27 MED ORDER — ONDANSETRON HCL 4 MG/2ML IJ SOLN
INTRAMUSCULAR | Status: AC
  Filled 2020-01-27: qty 2

## 2020-01-27 MED ORDER — ACETAMINOPHEN 325 MG PO TABS: 325.0000 mg | ORAL_TABLET | ORAL | Status: DC | PRN

## 2020-01-27 MED ORDER — LABETALOL HCL 5 MG/ML IV SOLN: 10.0000 mg | INTRAVENOUS | Status: DC | PRN

## 2020-01-27 MED ORDER — LIDOCAINE 2% (20 MG/ML) 5 ML SYRINGE
INTRAMUSCULAR | Status: DC | PRN
  Administered 2020-01-27: 60 mg via INTRAVENOUS

## 2020-01-27 MED ORDER — HEPARIN SODIUM (PORCINE) 5000 UNIT/ML IJ SOLN
5000.0000 [IU] | Freq: Three times a day (TID) | INTRAMUSCULAR | Status: DC
  Administered 2020-01-28: 5000 [IU] via SUBCUTANEOUS
  Filled 2020-01-27: qty 1

## 2020-01-27 MED ORDER — PROTAMINE SULFATE 10 MG/ML IV SOLN
INTRAVENOUS | Status: DC | PRN
  Administered 2020-01-27: 50 mg via INTRAVENOUS

## 2020-01-27 MED ORDER — DOCUSATE SODIUM 100 MG PO CAPS
100.0000 mg | ORAL_CAPSULE | Freq: Every day | ORAL | Status: DC
  Administered 2020-01-28: 100 mg via ORAL
  Filled 2020-01-27 (×2): qty 1

## 2020-01-27 MED ORDER — FENTANYL CITRATE (PF) 250 MCG/5ML IJ SOLN
INTRAMUSCULAR | Status: DC | PRN
  Administered 2020-01-27: 25 ug via INTRAVENOUS
  Administered 2020-01-27: 75 ug via INTRAVENOUS
  Administered 2020-01-27: 50 ug via INTRAVENOUS

## 2020-01-27 MED ORDER — SODIUM CHLORIDE 0.9 % IV SOLN: INTRAVENOUS | Status: DC

## 2020-01-27 MED ORDER — ATORVASTATIN CALCIUM 10 MG PO TABS
20.0000 mg | ORAL_TABLET | Freq: Every day | ORAL | Status: DC
  Administered 2020-01-27 – 2020-01-28 (×2): 20 mg via ORAL
  Filled 2020-01-27 (×2): qty 2

## 2020-01-27 MED ORDER — CLOPIDOGREL BISULFATE 75 MG PO TABS: 75.0000 mg | ORAL_TABLET | Freq: Every evening | ORAL | Status: DC

## 2020-01-27 MED ORDER — ONDANSETRON HCL 4 MG/2ML IJ SOLN: 4.0000 mg | Freq: Four times a day (QID) | INTRAMUSCULAR | Status: DC | PRN

## 2020-01-27 MED ORDER — FENTANYL CITRATE (PF) 250 MCG/5ML IJ SOLN
INTRAMUSCULAR | Status: AC
  Filled 2020-01-27: qty 5

## 2020-01-27 SURGICAL SUPPLY — 47 items
CANISTER SUCT 3000ML PPV (MISCELLANEOUS) ×3 IMPLANT
CLIP VESOCCLUDE MED 24/CT (CLIP) ×3 IMPLANT
CLIP VESOCCLUDE SM WIDE 24/CT (CLIP) ×3 IMPLANT
DERMABOND ADVANCED (GAUZE/BANDAGES/DRESSINGS) ×2
DERMABOND ADVANCED .7 DNX12 (GAUZE/BANDAGES/DRESSINGS) ×1 IMPLANT
DRAIN CHANNEL 15F RND FF W/TCR (WOUND CARE) IMPLANT
DRAPE X-RAY CASS 24X20 (DRAPES) IMPLANT
ELECT REM PT RETURN 9FT ADLT (ELECTROSURGICAL) ×3
ELECTRODE REM PT RTRN 9FT ADLT (ELECTROSURGICAL) ×1 IMPLANT
EVACUATOR SILICONE 100CC (DRAIN) IMPLANT
GLOVE BIOGEL PI IND STRL 6.5 (GLOVE) ×3 IMPLANT
GLOVE BIOGEL PI IND STRL 7.5 (GLOVE) ×3 IMPLANT
GLOVE BIOGEL PI INDICATOR 6.5 (GLOVE) ×6
GLOVE BIOGEL PI INDICATOR 7.5 (GLOVE) ×6
GLOVE SURG SS PI 6.5 STRL IVOR (GLOVE) ×6 IMPLANT
GLOVE SURG SS PI 7.0 STRL IVOR (GLOVE) ×3 IMPLANT
GLOVE SURG SS PI 7.5 STRL IVOR (GLOVE) ×6 IMPLANT
GLOVE SURG SYN 7.5  E (GLOVE) ×3
GLOVE SURG SYN 7.5 E (GLOVE) ×1 IMPLANT
GOWN STRL REUS W/ TWL LRG LVL3 (GOWN DISPOSABLE) ×3 IMPLANT
GOWN STRL REUS W/ TWL XL LVL3 (GOWN DISPOSABLE) ×1 IMPLANT
GOWN STRL REUS W/TWL LRG LVL3 (GOWN DISPOSABLE) ×9
GOWN STRL REUS W/TWL XL LVL3 (GOWN DISPOSABLE) ×3
HEMOSTAT SNOW SURGICEL 2X4 (HEMOSTASIS) ×3 IMPLANT
KIT BASIN OR (CUSTOM PROCEDURE TRAY) ×3 IMPLANT
KIT TURNOVER KIT B (KITS) ×3 IMPLANT
NS IRRIG 1000ML POUR BTL (IV SOLUTION) ×6 IMPLANT
PACK PERIPHERAL VASCULAR (CUSTOM PROCEDURE TRAY) ×3 IMPLANT
PAD ARMBOARD 7.5X6 YLW CONV (MISCELLANEOUS) ×6 IMPLANT
PATCH HEMASHIELD 8X150 (Vascular Products) ×3 IMPLANT
SET COLLECT BLD 21X3/4 12 (NEEDLE) IMPLANT
SET WALTER ACTIVATION W/DRAPE (SET/KITS/TRAYS/PACK) IMPLANT
SPONGE INTESTINAL PEANUT (DISPOSABLE) ×3 IMPLANT
STOPCOCK 4 WAY LG BORE MALE ST (IV SETS) IMPLANT
SUT ETHILON 3 0 PS 1 (SUTURE) IMPLANT
SUT MNCRL AB 4-0 PS2 18 (SUTURE) ×3 IMPLANT
SUT PROLENE 5 0 C 1 24 (SUTURE) ×3 IMPLANT
SUT PROLENE 6 0 BV (SUTURE) ×24 IMPLANT
SUT VIC AB 2-0 CT1 27 (SUTURE) ×3
SUT VIC AB 2-0 CT1 TAPERPNT 27 (SUTURE) ×1 IMPLANT
SUT VIC AB 3-0 SH 27 (SUTURE) ×3
SUT VIC AB 3-0 SH 27X BRD (SUTURE) ×1 IMPLANT
SUT VIC AB 3-0 X1 27 (SUTURE) ×3 IMPLANT
TOWEL GREEN STERILE (TOWEL DISPOSABLE) ×3 IMPLANT
TUBING EXTENTION W/L.L. (IV SETS) IMPLANT
UNDERPAD 30X30 (UNDERPADS AND DIAPERS) ×3 IMPLANT
WATER STERILE IRR 1000ML POUR (IV SOLUTION) ×3 IMPLANT

## 2020-01-27 NOTE — Anesthesia Postprocedure Evaluation (Signed)
Anesthesia Post Note  Patient: [name]  Procedure(s) Performed: REDO OF LEFT ENDARTERECTOMY FEMORAL (Left ) PATCH ANGIOPLASTY USING HEMASHIELD PLATINUM FINESSE PATCH (Left )     Patient location during evaluation: PACU Anesthesia Type: General Level of consciousness: awake Vital Signs Assessment: post-procedure vital signs reviewed and stable Respiratory status: spontaneous breathing Cardiovascular status: stable Postop Assessment: no apparent nausea or vomiting Anesthetic complications: no    Last Vitals:  Vitals:   01/27/20 0756 01/27/20 1219  BP: (!) 149/61 134/70  Pulse: 67 89  Resp: 20 18  Temp: 36.8 C (P) 37.1 C  SpO2: 100% 100%    Last Pain:  Vitals:   01/27/20 0808  TempSrc:   PainSc: 0-No pain                 Jaelah Hauth

## 2020-01-27 NOTE — Interval H&P Note (Signed)
History and Physical Interval Note:  01/27/2020 9:07 AM  [name]  has presented today for surgery, with the diagnosis of [place].  The various methods of treatment have been discussed with the patient and family. After consideration of risks, benefits and other options for treatment, the patient has consented to  Procedure(s): REDO OF LEFT ENDARTERECTOMY FEMORAL (Left) as a surgical intervention.  The patient's history has been reviewed, patient examined, no change in status, stable for surgery.  I have reviewed the patient's chart and labs.  Questions were answered to the patient's satisfaction.     Annamarie Major

## 2020-01-27 NOTE — Anesthesia Procedure Notes (Signed)
Procedure Name: Intubation Date/Time: 01/27/2020 9:36 AM Performed by: Verdie Drown, CRNA Pre-anesthesia Checklist: Patient identified, Emergency Drugs available, Suction available and Patient being monitored Patient Re-evaluated:Patient Re-evaluated prior to induction Oxygen Delivery Method: Circle System Utilized Preoxygenation: Pre-oxygenation with 100% oxygen Induction Type: IV induction Ventilation: Mask ventilation without difficulty Laryngoscope Size: Miller and 3 Grade View: Grade I Tube type: Oral Tube size: 7.0 mm Number of attempts: 1 Airway Equipment and Method: Stylet Placement Confirmation: ETT inserted through vocal cords under direct vision,  positive ETCO2 and breath sounds checked- equal and bilateral Secured at: 21 cm Tube secured with: Tape Dental Injury: Teeth and Oropharynx as per pre-operative assessment  Comments: Performed by Martinique Ingram SRNA

## 2020-01-27 NOTE — Transfer of Care (Signed)
Immediate Anesthesia Transfer of Care Note  Patient: [name]  Procedure(s) Performed: REDO OF LEFT ENDARTERECTOMY FEMORAL (Left ) PATCH ANGIOPLASTY USING HEMASHIELD PLATINUM FINESSE PATCH (Left )  Patient Location: PACU  Anesthesia Type:General  Level of Consciousness: Response to stimulation  Airway & Oxygen Therapy: Patient Spontanous Breathing and Patient connected to face mask  Post-op Assessment: Report given to RN, Post -op Vital signs reviewed and stable, Patient moving all extremities and Patient able to stick tongue midline  Post vital signs: Reviewed and stable  Last Vitals:  Vitals Value Taken Time  BP 134/70 01/27/20 1219  Temp    Pulse 89 01/27/20 1222  Resp 30 01/27/20 1222  SpO2 100 % 01/27/20 1222  Vitals shown include unvalidated device data.  Last Pain:  Vitals:   01/27/20 0808  TempSrc:   PainSc: 0-No pain         Complications: No apparent anesthesia complications

## 2020-01-27 NOTE — Anesthesia Postprocedure Evaluation (Signed)
Anesthesia Post Note  Patient: [name]  Procedure(s) Performed: REDO OF LEFT ENDARTERECTOMY FEMORAL (Left ) PATCH ANGIOPLASTY USING HEMASHIELD PLATINUM FINESSE PATCH (Left )     Patient location during evaluation: PACU Anesthesia Type: General Level of consciousness: awake Pain management: pain level controlled Vital Signs Assessment: post-procedure vital signs reviewed and stable Respiratory status: spontaneous breathing Cardiovascular status: stable Postop Assessment: no apparent nausea or vomiting Anesthetic complications: no    Last Vitals:  Vitals:   01/27/20 1440 01/27/20 1500  BP:  136/71  Pulse:  71  Resp:  13  Temp: (!) 36.1 C 37.1 C  SpO2:  98%    Last Pain:  Vitals:   01/27/20 1500  TempSrc: Oral  PainSc: 0-No pain                 Katty Fretwell

## 2020-01-27 NOTE — Op Note (Signed)
    Patient name: [name] MRN: [id number] DOB: 1946-08-27 Sex: female  01/27/2020 Pre-operative Diagnosis: Stenosis at the femoral anastomosis of her left aortobifemoral bypass Post-operative diagnosis:  Same Surgeon:  Annamarie Major Assistants: Gae Gallop, Laurence Slate Procedure:   #1: Redo left femoral artery exposure   #2: Left common femoral profundofemoral and superficial femoral artery endarterectomy with dacryon patch angioplasty Anesthesia:  \General Blood Loss: 150 cc Specimens: None  Findings: Near occlusive stenosis at the distal anastomosis extending into the superficial femoral artery.  I performed endarterectomy of the superficial femoral artery for approximately 3 cm.  The entire artery appeared to be heavily calcified.  I was able to get an adequate endpoint.  Dacryon patch was placed over top of the distal portion of the left limb of the graft extending down onto the superficial femoral artery  Indications: The patient has a history of a aortobifemoral bypass graft many years ago.  She has had a return of her symptoms more severe on the left leg.  Angiography revealed a high-grade stenosis at the distal anastomosis on the left.  She comes in today for repair.  Procedure:  The patient was identified in the holding area and taken to [place] [facility]  The patient was then placed supine on the table. general anesthesia was administered.  The patient was prepped and draped in the usual sterile fashion.  A time out was called and antibiotics were administered.  The previous vertical left groin incision was reopened with a 10 blade.  Cautery was used about subcutaneous tissue.  A combination of sharp and cautery dissection were used to isolate the aortobifemoral limb, the common femoral artery, as well as the profunda and superficial femoral artery.  There was heavily calcified plaque at the origin of the superficial femoral artery.  I ended up dissecting out approximately 4 cm of  the superficial femoral artery as well as several profunda branches.  Once adequate exposure was obtained, the patient was fully heparinized.  After the heparin circulated vascular clamps were used to occlude all of the vessels.  I used a #11 blade to open the hood of the left limb of the aortobifemoral graft and extended this down onto the superficial femoral artery for approximately 3 cm.  A Marlene Bast elevator was used to perform endarterectomy.  There was near occlusive plaque in the distal common femoral artery and proximal superficial femoral artery.  The superficial femoral artery appeared to be diffusely diseased throughout.  Therefore I got an adequate endpoint about 2-1/2 cm distal on the superficial femoral artery and truncated at this level.  There was good backbleeding from the profundofemoral artery.  All potential embolic debris was removed.  A dacryon patch was brought onto the field and patch angioplasty was performed using a running 6-0 Prolene.  Prior to completion the appropriate flushing maneuvers were performed and the anastomosis was completed.  There were brisk Doppler signals in the superficial femoral and profundofemoral artery.  The patient's heparin was then reversed with 50 mg of protamine.  Once hemostasis was satisfactory, the femoral sheath was reapproximated with 2-0 Vicryl.  The subcutaneous tissue was then closed with additional layers of 2-0 and 3-0 Vicryl and the skin was closed with 4-0 Vicryl.  Dermabond was applied.  There were no immediate complications.   Disposition: To PACU stable.   Theotis Burrow, M.D., [facility] [place] Office: [phone] Pager:  [phone]

## 2020-01-28 LAB — CBC
HCT: 30.8 % — ABNORMAL LOW (ref 36.0–46.0)
Hemoglobin: 9.8 g/dL — ABNORMAL LOW (ref 12.0–15.0)
MCH: 27.5 pg (ref 26.0–34.0)
MCHC: 31.8 g/dL (ref 30.0–36.0)
MCV: 86.5 fL (ref 80.0–100.0)
Platelets: 178 10*3/uL (ref 150–400)
RBC: 3.56 MIL/uL — ABNORMAL LOW (ref 3.87–5.11)
RDW: 17 % — ABNORMAL HIGH (ref 11.5–15.5)
WBC: 8 10*3/uL (ref 4.0–10.5)
nRBC: 0 % (ref 0.0–0.2)

## 2020-01-28 LAB — BASIC METABOLIC PANEL
Anion gap: 12 (ref 5–15)
BUN: 8 mg/dL (ref 8–23)
CO2: 28 mmol/L (ref 22–32)
Calcium: 9 mg/dL (ref 8.9–10.3)
Chloride: 99 mmol/L (ref 98–111)
Creatinine, Ser: 0.78 mg/dL (ref 0.44–1.00)
GFR calc Af Amer: >60 mL/min (ref >60)
GFR calc non Af Amer: >60 mL/min (ref >60)
Glucose, Bld: 130 mg/dL — ABNORMAL HIGH (ref 70–99)
Potassium: 3.7 mmol/L (ref 3.5–5.1)
Sodium: 139 mmol/L (ref 135–145)

## 2020-01-28 MED ORDER — OXYCODONE HCL 5 MG PO TABS: 5.0000 mg | ORAL_TABLET | Freq: Four times a day (QID) | ORAL | 0 refills | Status: DC | PRN

## 2020-01-28 NOTE — Progress Notes (Signed)
Pt bathed/walking in room independently. No c/o of significant pain. Will continue to monitor.

## 2020-01-28 NOTE — Progress Notes (Signed)
Patient and spouse given discharge instructions medication list with next dose due,  and follow up appointments. Patient verbalized understanding. All questions answered. IV and tele removed will discharge home as ordered. Transported to exit via wheel chair and nursing staff. Nelda Bucks, Drexel Iha

## 2020-01-28 NOTE — Progress Notes (Addendum)
[facility] [place]  Subjective  - Comfortable and is ready to go home without new complaints.   Objective 106/66 75 98.7 F (37.1 C) (Oral) 16 100%  Intake/Output Summary (Last 24 hours) at 01/28/2020 0825 Last data filed at 01/28/2020 0132 Gross per 24 hour  Intake 316.77 ml  Output 300 ml  Net 16.77 ml    Left groin incision healing well without hematoma. Palpable DP left foot Lungs non labored breathing   Assessment/Planning: POD # 1  #1: Redo left femoral artery exposure                         #2: Left common femoral profundofemoral and superficial femoral artery endarterectomy with dacryon patch angioplasty Patent left LE arterial flow Stable disposition for discharge F/U in 2-3 weeks with Dr. Cristi Loron 01/28/2020 8:25 AM --  Laboratory Lab Results: Recent Labs    01/25/20 0941 01/28/20 0001  WBC 7.0 8.0  HGB 11.6* 9.8*  HCT 37.3 30.8*  PLT 217 178   BMET Recent Labs    01/25/20 0941 01/28/20 0001  NA 142 139  K 3.6 3.7  CL 100 99  CO2 30 28  GLUCOSE 92 130*  BUN 14 8  CREATININE 0.89 0.78  CALCIUM 9.4 9.0    COAG Lab Results  Component Value Date   INR 0.9 01/25/2020   INR 0.95 10/19/2012   INR 1.06 05/15/2011   No results found for: PTT  I agree with the above.  I have seen and evaluated the patient.  She is postoperative day #1 status post redo left femoral endarterectomy with patch angioplasty.  Her incision is healing nicely.  Her pain is manageable.  She has brisk Doppler signals in her feet.  She is stable for discharge.  I will see her back in the office in 2 to 3 weeks  Wells Payne Garske

## 2020-01-28 NOTE — Discharge Instructions (Signed)
 [facility] [place]  Discharge instructions  Lower Extremity Bypass Surgery  Please refer to the following instruction for your post-procedure care. Your surgeon or physician assistant will discuss any changes with you.  Activity  You are encouraged to walk as much as you can. You can slowly return to normal activities during the month after your surgery. Avoid strenuous activity and heavy lifting until your doctor tells you it's OK. Avoid activities such as vacuuming or swinging a golf club. Do not drive until your doctor give the OK and you are no longer taking prescription pain medications. It is also normal to have difficulty with sleep habits, eating and bowel movement after surgery. These will go away with time.  Bathing/Showering  You may shower after you go home. Do not soak in a bathtub, hot tub, or swim until the incision heals completely.  Incision Care  Clean your incision with mild soap and water. Shower every day. Pat the area dry with a clean towel. You do not need a bandage unless otherwise instructed. Do not apply any ointments or creams to your incision. If you have open wounds you will be instructed how to care for them or a visiting nurse may be arranged for you. If you have staples or sutures along your incision they will be removed at your post-op appointment. You may have skin glue on your incision. Do not peel it off. It will come off on its own in about one week. If you have a great deal of moisture in your groin, use a gauze help keep this area dry.  Diet  Resume your normal diet. There are no special food restrictions following this procedure. A low fat/ low cholesterol diet is recommended for all patients with vascular disease. In order to heal from your surgery, it is CRITICAL to get adequate nutrition. Your body requires vitamins, minerals, and protein. Vegetables are the best source of vitamins and minerals. Vegetables also provide the  perfect balance of protein. Processed food has little nutritional value, so try to avoid this.  Medications  Resume taking all your medications unless your doctor or nurse practitioner tells you not to. If your incision is causing pain, you may take over-the-counter pain relievers such as acetaminophen (Tylenol). If you were prescribed a stronger pain medication, please aware these medication can cause nausea and constipation. Prevent nausea by taking the medication with a snack or meal. Avoid constipation by drinking plenty of fluids and eating foods with high amount of fiber, such as fruits, vegetables, and grains. Take Colase 100 mg (an over-the-counter stool softener) twice a day as needed for constipation. Do not take Tylenol if you are taking prescription pain medications.  Follow Up  Our office will schedule a follow up appointment 2-3 weeks following discharge.  Please call us immediately for any of the following conditions  Severe or worsening pain in your legs or feet while at rest or while walking Increase pain, redness, warmth, or drainage (pus) from your incision site(s) Fever of 101 degree or higher The swelling in your leg with the bypass suddenly worsens and becomes more painful than when you were in the hospital If you have been instructed to feel your graft pulse then you should do so every day. If you can no longer feel this pulse, call the office immediately. Not all patients are given this instruction.  Leg swelling is common after leg bypass surgery.  The swelling should improve over a few months   following surgery. To improve the swelling, you may elevate your legs above the level of your heart while you are sitting or resting. Your surgeon or physician assistant may ask you to apply an ACE wrap or wear compression (TED) stockings to help to reduce swelling.  Reduce your risk of vascular disease  Stop smoking. If you would like help call [facility] at 1-800-QUIT-NOW  (1-800-784-8669) or [place] at 336-586-4000.  Manage your cholesterol Maintain a desired weight Control your diabetes weight Control your diabetes Keep your blood pressure down  If you have any questions, please call the office at [phone]   

## 2020-01-28 NOTE — Progress Notes (Signed)
01/27/2020 1500 Received pt to room 4E-05 from PACU.  Pt is A&O, C/O some soreness in the L groin at incision site.  Tele monitor applied and CCMD notified.  CHG bath given.  Oriented pt to room, call light and bed.  Call bell in reach, family at bedside. [place]

## 2020-01-28 NOTE — Progress Notes (Signed)
PT Cancellation Note  Patient Details Name: [name] MRN: [id number] DOB: [birth year]   Cancelled Treatment:    Reason Eval/Treat Not Completed: PT screened, no needs identified, will sign off. Pt screened, ambulating independently in room per OT and nursing. Pt reports she is near her baseline with no PT needs. Acute PT signing off.   Zenaida Niece 01/28/2020, 8:37 AM

## 2020-01-28 NOTE — Evaluation (Addendum)
Occupational Therapy Evaluation and Discharge Patient Details Name: [name] MRN: [id number] DOB: October 20, 1946 Today's Date: 01/28/2020    History of Present Illness Pt is a 74 y/o female now s/p Redo left femoral artery exposure, Left common femoral profundofemoral and superficial femoral artery endarterectomy. PMHx includes CA, HTN, PVD, TIA   Clinical Impression   This 74 y/o female presents with the above. PTA pt reports independence with ADL and functional mobility. Pt completing functional transfers and room level mobility without AD at supervision-mod independent level today. She currently requires minA for LB ADL secondary to LLE discomfort/stiffness given recent procedure. Pt reports plans to return home with spouse/son who can assist with ADL/iADL tasks PRN. She reports feeling comfortable completing ADL/mobility tasks after return home, reports completed bathing ADL earlier this AM without difficulty. Given current status and available family support do not anticipate pt will require follow up OT services after discharge and with no further acute OT needs identified. Education provided and questions answered throughout. Acute OT to sign off, thank you for this referral.     Follow Up Recommendations  No OT follow up;Supervision/Assistance - 24 hour(24hr initially)    Equipment Recommendations  None recommended by OT           Precautions / Restrictions Precautions Precautions: None Restrictions Weight Bearing Restrictions: No      Mobility Bed Mobility Overal bed mobility: Needs Assistance Bed Mobility: Supine to Sit     Supine to sit: Supervision;HOB elevated     General bed mobility comments: for lines and safety  Transfers Overall transfer level: Modified independent                    Balance Overall balance assessment: No apparent balance deficits (not formally assessed)                                         ADL either  performed or assessed with clinical judgement   ADL Overall ADL's : Needs assistance/impaired Eating/Feeding: Modified independent;Sitting   Grooming: Supervision/safety;Standing   Upper Body Bathing: Modified independent;Sitting   Lower Body Bathing: Sit to/from stand;Minimal assistance   Upper Body Dressing : Modified independent;Sitting   Lower Body Dressing: Sit to/from stand;Minimal assistance Lower Body Dressing Details (indicate cue type and reason): minA only due to LLE stiffness/discomfort, reports spouse can assist with ADL task Toilet Transfer: Supervision/safety;Ambulation Toilet Transfer Details (indicate cue type and reason): simulated via transfer to recliner, room level mobility  Toileting- Clothing Manipulation and Hygiene: Supervision/safety;Sit to/from stand       Functional mobility during ADLs: Supervision/safety                           Pertinent Vitals/Pain Pain Assessment: Faces Faces Pain Scale: Hurts a little bit Pain Location: incisional, reports has improved with mobility earlier this AM Pain Descriptors / Indicators: Discomfort;Sore Pain Intervention(s): Limited activity within patient's tolerance;Monitored during session;Repositioned     Hand Dominance     Extremity/Trunk Assessment Upper Extremity Assessment Upper Extremity Assessment: Overall WFL for tasks assessed   Lower Extremity Assessment Lower Extremity Assessment: Overall WFL for tasks assessed       Communication Communication Communication: No difficulties   Cognition Arousal/Alertness: Awake/alert Behavior During Therapy: WFL for tasks assessed/performed Overall Cognitive Status: Within Functional Limits for tasks assessed  General Comments: very pleasant   General Comments  VSS on RA    Exercises     Shoulder Instructions      Home Living Family/patient expects to be discharged to:: Private residence Living  Arrangements: Spouse/significant other Available Help at Discharge: Family Type of Home: House Home Access: Stairs to enter [employer] of Steps: 2   Home Layout: One level     Bathroom Shower/Tub: Tub/shower unit;Walk-in shower   Bathroom Toilet: Standard     Home Equipment: None          Prior Functioning/Environment Level of Independence: Independent                 OT Problem List: Decreased knowledge of precautions;Pain;Decreased activity tolerance;Decreased range of motion      OT Treatment/Interventions:      OT Goals(Current goals can be found in the care plan section) Acute Rehab OT Goals Patient Stated Goal: eager for home today OT Goal Formulation: All assessment and education complete, DC therapy  OT Frequency:     Barriers to D/C:            Co-evaluation              AM-PAC OT "6 Clicks" Daily Activity     Outcome Measure Help from another person eating meals?: None Help from another person taking care of personal grooming?: None Help from another person toileting, which includes using toliet, bedpan, or urinal?: None Help from another person bathing (including washing, rinsing, drying)?: A Little Help from another person to put on and taking off regular upper body clothing?: None Help from another person to put on and taking off regular lower body clothing?: A Little 6 Click Score: 22   End of Session Nurse Communication: Mobility status  Activity Tolerance: Patient tolerated treatment well Patient left: in chair;with call bell/phone within reach  OT Visit Diagnosis: Other abnormalities of gait and mobility (R26.89);Pain Pain - Right/Left: Left Pain - part of body: Leg                Time: 0812-0832 OT Time Calculation (min): 20 min Charges:  OT General Charges $OT Visit: 1 Visit OT Evaluation $OT Eval Moderate Complexity: [place], OT Acute Rehabilitation Services Pager [phone] Office  [place] 01/28/2020, 10:18 AM

## 2020-01-30 NOTE — Discharge Summary (Signed)
[facility] Discharge Summary   Patient ID:  [name] MRN: [id number] DOB/AGE: 06-24-1946 74 y.o.  Admit date: 01/27/2020 Discharge date: 01/28/20 Date of Surgery: 01/27/2020 Surgeon: Surgeon(s): Serafina Mitchell, MD Angelia Mould, MD  Admission Diagnosis: PAD (peripheral artery disease) [facility]) [I73.9]  Discharge Diagnoses:  PAD (peripheral artery disease) ([place]) [I73.9]  Secondary Diagnoses: Past Medical History:  Diagnosis Date  . Abdominal wall hernia   . Blood transfusion    30+ yrs. ago  . Cancer (HCC)    Right Breast  . Hyperlipidemia   . Hypertension   . Peripheral vascular disease ([place])   . Seasonal allergies   . TIA (transient ischemic attack)     Procedure(s): REDO OF LEFT ENDARTERECTOMY FEMORAL PATCH ANGIOPLASTY USING HEMASHIELD PLATINUM FINESSE PATCH  Discharged Condition: good  HPI: [name] a 74 y.o.female, who isstatus post aorto-bifemoral bypass in on 05-16-2011 for iiliac occlusion and claudication. She has not been seen in our office for over 7 years. She has recently began noticing pain and kneeling in her feet which wakes her up at night. She is also getting cramps with ambulation. She does not have any open wounds.  She went for angiography on 01/10/2020.  I found a 70% stenosis at the left femoral anastomosis as well as a short segment occlusion of the proximal left superficial femoral artery.  She also has an occluded right superficial femoral artery.   Hospital Course:  [name] is a 74 y.o. female is S/P Left Procedure(s): REDO OF LEFT ENDARTERECTOMY FEMORAL PATCH ANGIOPLASTY USING HEMASHIELD PLATINUM FINESSE PATCH  Left groin without hematoma, healing well.  Palpable left DP pulse.  Pain well controlled.  She was discharged in stable condition.      Significant Diagnostic Studies: CBC Lab Results  Component Value Date   WBC 8.0 01/28/2020   HGB 9.8 (L) 01/28/2020   HCT 30.8 (L)  01/28/2020   MCV 86.5 01/28/2020   PLT 178 01/28/2020    BMET    Component Value Date/Time   NA 139 01/28/2020 0001   NA 136 09/22/2019 1200   NA 139 05/02/2013 0949   K 3.7 01/28/2020 0001   K 3.2 (L) 05/02/2013 0949   CL 99 01/28/2020 0001   CL 101 05/02/2013 0949   CO2 28 01/28/2020 0001   CO2 28 05/02/2013 0949   GLUCOSE 130 (H) 01/28/2020 0001   GLUCOSE 77 05/02/2013 0949   BUN 8 01/28/2020 0001   BUN 8 09/22/2019 1200   BUN 12.1 05/02/2013 0949   CREATININE 0.78 01/28/2020 0001   CREATININE 0.92 11/29/2015 0949   CREATININE 0.9 05/02/2013 0949   CALCIUM 9.0 01/28/2020 0001   CALCIUM 9.3 05/02/2013 0949   GFRNONAA >60 01/28/2020 0001   GFRAA >60 01/28/2020 0001   COAG Lab Results  Component Value Date   INR 0.9 01/25/2020   INR 0.95 10/19/2012   INR 1.06 05/15/2011     Disposition:  Discharge to :Home Discharge Instructions    Call MD for:  redness, tenderness, or signs of infection (pain, swelling, bleeding, redness, odor or green/yellow discharge around incision site)   Complete by: As directed    Call MD for:  severe or increased pain, loss or decreased feeling  in affected limb(s)   Complete by: As directed    Call MD for:  temperature >100.5   Complete by: As directed    Resume previous diet   Complete by: As directed  Allergies as of 01/28/2020      Reactions   Latex Rash      Medication List    TAKE these medications   acetaminophen 500 MG tablet Commonly known as: TYLENOL Take 1,000 mg by mouth every 6 (six) hours as needed for mild pain or moderate pain.   aspirin EC 81 MG tablet Take 81 mg by mouth every evening.   atorvastatin 20 MG tablet Commonly known as: LIPITOR TAKE 1 TABLET BY MOUTH ONCE DAILY What changed: when to take this   AZO STANDARD MAXIMUM STRENGTH PO Take 1 tablet by mouth 3 (three) times daily as needed (urinary discomfort with bladder treatment).   BENADRYL ITCH STOPPING EX Apply 1 application topically 4  (four) times daily as needed (skin rash/irritation.).   clopidogrel 75 MG tablet Commonly known as: PLAVIX Take 1 tablet (75 mg total) by mouth daily. Resume once you no longer have any bleeding in your urine or in your stool and you have completed all your scheduled procedures. What changed: when to take this   hydrochlorothiazide 25 MG tablet Commonly known as: HYDRODIURIL TAKE 1 TABLET BY MOUTH EVERY DAY What changed: when to take this   hydroxypropyl methylcellulose / hypromellose 2.5 % ophthalmic solution Commonly known as: ISOPTO TEARS / GONIOVISC Place 1 drop into both eyes 4 (four) times daily as needed for dry eyes (dry/irritated eyes.).   multivitamin Liqd Take 15 mLs by mouth 2 (two) times a week. Nature's Plus Source of Life Gold Liquid Notes to patient: Take as you were prior to admission   oxyCODONE 5 MG immediate release tablet Commonly known as: Oxy IR/ROXICODONE Take 1 tablet (5 mg total) by mouth every 6 (six) hours as needed for moderate pain.   simethicone 125 MG chewable tablet Commonly known as: MYLICON Chew 0000000 mg by mouth every 6 (six) hours as needed for flatulence.   Vicks Vaporub 4.73-1.2-2.6 % Oint Apply 1 application topically 3 (three) times daily as needed (leg discomfort).   Vitamin D 50 MCG (2000 UT) tablet Take 2,000 Units by mouth every evening. Notes to patient: Take as you were prior to admission      Verbal and written Discharge instructions given to the patient. Wound care per Discharge AVS Follow-up Information    Serafina Mitchell, MD Follow up in 2 week(s).   Specialties: Vascular Surgery, Cardiology Why: office will call Contact information: [address] [place] [address] [phone]           Signed: Roxy Horseman 01/30/2020, 12:12 PM

## 2020-02-06 ENCOUNTER — Encounter: Payer: Self-pay | Admitting: Surgery

## 2020-02-06 ENCOUNTER — Ambulatory Visit (INDEPENDENT_AMBULATORY_CARE_PROVIDER_SITE_OTHER): Payer: Medicare HMO | Admitting: Surgery

## 2020-02-06 ENCOUNTER — Other Ambulatory Visit: Payer: Self-pay

## 2020-02-06 VITALS — BP 109/68 | HR 81 | Temp 97.2°F | Resp 20 | Ht 63.0 in | Wt 136.0 lb

## 2020-02-06 DIAGNOSIS — I70213 Atherosclerosis of native arteries of extremities with intermittent claudication, bilateral legs: Secondary | ICD-10-CM

## 2020-02-06 NOTE — Progress Notes (Signed)
Patient name: [name] MRN: [id number] DOB: 10/18/1946 Sex: female  REASON FOR VISIT:     post op  HISTORY OF PRESENT ILLNESS:    [name] a 74 y.o.female, who isstatus post aorto-bifemoral bypass in on 05-16-2011 for iiliac occlusion and claudication. She has not been seen in our office for over 7 years. She has recently began noticing pain and kneeling in her feet which wakes her up at night. She is also getting cramps with ambulation. She does not have any open wounds.  She went for angiography on 01/10/2020.  I found a 70% stenosis at the left femoral anastomosis as well as a short segment occlusion of the proximal left superficial femoral artery.  She also has an occluded right superficial femoral artery.  On 01/27/2020 she underwent left common femoral profundofemoral and superficial femoral endarterectomy with dacryon patch angioplasty for near occlusive stenosis at the distal anastomosis.  Her postoperative course was uncomplicated.  She is back today for follow-up.  Patient is currently undergoing treatment for bladder cancer. She is medically managed for hypertension. She takes a statin for hypercholesterolemia. She is on dual antiplatelet therapy.  CURRENT MEDICATIONS:    Current Outpatient Medications  Medication Sig Dispense Refill  . acetaminophen (TYLENOL) 500 MG tablet Take 1,000 mg by mouth every 6 (six) hours as needed for mild pain or moderate pain.    [name] aspirin EC 81 MG tablet Take 81 mg by mouth every evening.     [name] atorvastatin (LIPITOR) 20 MG tablet TAKE 1 TABLET BY MOUTH ONCE DAILY (Patient taking differently: Take 20 mg by mouth every evening. ) 90 tablet 3  . Camphor-Eucalyptus-Menthol (VICKS VAPORUB) 4.73-1.2-2.6 % OINT Apply 1 application topically 3 (three) times daily as needed (leg discomfort).    . Cholecalciferol (VITAMIN D) 50 MCG (2000 UT) tablet Take 2,000 Units by mouth every evening.     .  clopidogrel (PLAVIX) 75 MG tablet Take 1 tablet (75 mg total) by mouth daily. Resume once you no longer have any bleeding in your urine or in your stool and you have completed all your scheduled procedures. (Patient taking differently: Take 75 mg by mouth every evening. Resume once you no longer have any bleeding in your urine or in your stool and you have completed all your scheduled procedures.) 90 tablet 3  . diphenhydrAMINE HCl (BENADRYL ITCH STOPPING EX) Apply 1 application topically 4 (four) times daily as needed (skin rash/irritation.).    [name] hydrochlorothiazide (HYDRODIURIL) 25 MG tablet TAKE 1 TABLET BY MOUTH EVERY DAY (Patient taking differently: Take 25 mg by mouth every evening. ) 90 tablet 0  . hydroxypropyl methylcellulose / hypromellose (ISOPTO TEARS / GONIOVISC) 2.5 % ophthalmic solution Place 1 drop into both eyes 4 (four) times daily as needed for dry eyes (dry/irritated eyes.).     [name] Multiple Vitamin (MULTIVITAMIN) LIQD Take 15 mLs by mouth 2 (two) times a week. Nature's Plus Source of Life Gold Liquid    . oxyCODONE (OXY IR/ROXICODONE) 5 MG immediate release tablet Take 1 tablet (5 mg total) by mouth every 6 (six) hours as needed for moderate pain. 10 tablet 0  . Phenazopyridine HCl (AZO STANDARD MAXIMUM STRENGTH PO) Take 1 tablet by mouth 3 (three) times daily as needed (urinary discomfort with bladder treatment).    . simethicone (MYLICON) 0000000 MG chewable tablet Chew 125 mg by mouth every 6 (six) hours as needed for flatulence.     No current facility-administered medications for this visit.  REVIEW OF SYSTEMS:   [X]  denotes positive finding, [ ]  denotes negative finding Cardiac  Comments:  Chest pain or chest pressure:    Shortness of breath upon exertion:    Short of breath when lying flat:    Irregular heart rhythm:    Constitutional    Fever or chills:      PHYSICAL EXAM:   Vitals:   02/06/20 1358  BP: 109/68  Pulse: 81  Resp: 20  Temp: (!) 97.2 F (36.2 C)    SpO2: 99%  Weight: 136 lb (61.7 kg)  Height: 5\' 3"  (1.6 m)    GENERAL: The patient is a well-nourished female, in no acute distress. The vital signs are documented above. CARDIOVASCULAR: There is a regular rate and rhythm. PULMONARY: Non-labored respirations Left groin incision is healing nicely. Palpable left femoral pulse Nonpalpable pedal pulses  STUDIES:   None   MEDICAL ISSUES:   Doing very well status post left femoral endarterectomy and patch angioplasty.  She does have some shooting pains on the medial left thigh which are neuropathic in origin given her redo groin exposure and likely nerve irritation.  I have her scheduled for follow-up in 4 months with surveillance ultrasound.  Leia Alf, MD, FACS [facility] [phone] Pager [phone]

## 2020-02-08 ENCOUNTER — Telehealth: Payer: Self-pay | Admitting: Family Medicine

## 2020-02-08 ENCOUNTER — Other Ambulatory Visit: Payer: Self-pay | Admitting: *Deleted

## 2020-02-08 DIAGNOSIS — I739 Peripheral vascular disease, unspecified: Secondary | ICD-10-CM

## 2020-02-08 DIAGNOSIS — I70213 Atherosclerosis of native arteries of extremities with intermittent claudication, bilateral legs: Secondary | ICD-10-CM

## 2020-02-08 NOTE — Telephone Encounter (Signed)
ok 

## 2020-02-08 NOTE — Telephone Encounter (Signed)
Pt called and is wanting to know if it is safe for her to get the COVID shot she wants to get the Wynetta Emery and johnson shot since it is a one time shot, states she is getting cancer treatment so she wants to make sure it ok pt can be reached at [phone]

## 2020-02-08 NOTE — Telephone Encounter (Signed)
Pt called and is wanting to know if it is safe for her to get the johnson and johnson covid shot since she is getting cancer treatments, pt only wants to do the one time shot pt can be reached at [phone]

## 2020-02-09 NOTE — Telephone Encounter (Signed)
Pt was advised K[facility] 

## 2020-02-17 ENCOUNTER — Ambulatory Visit: Payer: Medicare HMO | Attending: Internal Medicine

## 2020-02-17 DIAGNOSIS — Z23 Encounter for immunization: Secondary | ICD-10-CM

## 2020-02-17 NOTE — Progress Notes (Signed)
   Covid-19 Vaccination Clinic  Name:  [name]    MRN: [id number] DOB: 07-Sep-1946  02/17/2020  [name] was observed post Covid-19 immunization for 15 minutes without incident. She was provided with Vaccine Information Sheet and instruction to access the V-Safe system.   [name] was instructed to call 911 with any severe reactions post vaccine: [name] Difficulty breathing  . Swelling of face and throat  . A fast heartbeat  . A bad rash all over body  . Dizziness and weakness   Immunizations Administered    Name Date Dose VIS Date Route   Pfizer COVID-19 Vaccine 02/17/2020  9:55 AM 0.3 mL 10/28/2019 Intramuscular   Manufacturer: [place]   Lot: [id number]   [place]: [id number]

## 2020-02-21 ENCOUNTER — Other Ambulatory Visit: Payer: Self-pay | Admitting: Family Medicine

## 2020-02-21 DIAGNOSIS — I10 Essential (primary) hypertension: Secondary | ICD-10-CM

## 2020-03-06 DIAGNOSIS — C672 Malignant neoplasm of lateral wall of bladder: Secondary | ICD-10-CM | POA: Diagnosis not present

## 2020-03-12 ENCOUNTER — Ambulatory Visit: Payer: Medicare HMO | Attending: Internal Medicine

## 2020-03-12 DIAGNOSIS — Z23 Encounter for immunization: Secondary | ICD-10-CM

## 2020-03-12 NOTE — Progress Notes (Signed)
   Covid-19 Vaccination Clinic  Name:  [name]    MRN: [id number] DOB: 11-15-1946  03/12/2020  [name] was observed post Covid-19 immunization for 15 minutes without incident. She was provided with Vaccine Information Sheet and instruction to access the V-Safe system.   [name] was instructed to call 911 with any severe reactions post vaccine: [name] Difficulty breathing  . Swelling of face and throat  . A fast heartbeat  . A bad rash all over body  . Dizziness and weakness   Immunizations Administered    Name Date Dose VIS Date Route   Pfizer COVID-19 Vaccine 03/12/2020 11:42 AM 0.3 mL 01/11/2019 Intramuscular   Manufacturer: [place]   Lot: [id number]   [place]: [id number]

## 2020-03-13 ENCOUNTER — Other Ambulatory Visit: Payer: Self-pay | Admitting: Family Medicine

## 2020-03-13 DIAGNOSIS — I10 Essential (primary) hypertension: Secondary | ICD-10-CM

## 2020-03-27 DIAGNOSIS — C678 Malignant neoplasm of overlapping sites of bladder: Secondary | ICD-10-CM | POA: Diagnosis not present

## 2020-03-27 DIAGNOSIS — Z5111 Encounter for antineoplastic chemotherapy: Secondary | ICD-10-CM | POA: Diagnosis not present

## 2020-04-03 DIAGNOSIS — Z5111 Encounter for antineoplastic chemotherapy: Secondary | ICD-10-CM | POA: Diagnosis not present

## 2020-04-03 DIAGNOSIS — C672 Malignant neoplasm of lateral wall of bladder: Secondary | ICD-10-CM | POA: Diagnosis not present

## 2020-04-08 ENCOUNTER — Emergency Department (HOSPITAL_COMMUNITY): Payer: Medicare HMO

## 2020-04-08 ENCOUNTER — Encounter (HOSPITAL_COMMUNITY): Payer: Self-pay | Admitting: Family Medicine

## 2020-04-08 ENCOUNTER — Other Ambulatory Visit: Payer: Self-pay

## 2020-04-08 ENCOUNTER — Inpatient Hospital Stay (HOSPITAL_COMMUNITY)
Admission: EM | Admit: 2020-04-08 | Discharge: 2020-04-11 | DRG: 378 | Disposition: A | Discharge status: Home / Self Care | Payer: Medicare HMO | Attending: Family Medicine | Admitting: Family Medicine

## 2020-04-08 DIAGNOSIS — D509 Iron deficiency anemia, unspecified: Secondary | ICD-10-CM | POA: Diagnosis not present

## 2020-04-08 DIAGNOSIS — K921 Melena: Secondary | ICD-10-CM | POA: Diagnosis not present

## 2020-04-08 DIAGNOSIS — I739 Peripheral vascular disease, unspecified: Secondary | ICD-10-CM | POA: Diagnosis not present

## 2020-04-08 DIAGNOSIS — K922 Gastrointestinal hemorrhage, unspecified: Secondary | ICD-10-CM | POA: Diagnosis present

## 2020-04-08 DIAGNOSIS — Z20822 Contact with and (suspected) exposure to covid-19: Secondary | ICD-10-CM | POA: Diagnosis present

## 2020-04-08 DIAGNOSIS — R519 Headache, unspecified: Secondary | ICD-10-CM | POA: Diagnosis not present

## 2020-04-08 DIAGNOSIS — I951 Orthostatic hypotension: Secondary | ICD-10-CM | POA: Diagnosis not present

## 2020-04-08 DIAGNOSIS — F17210 Nicotine dependence, cigarettes, uncomplicated: Secondary | ICD-10-CM | POA: Diagnosis present

## 2020-04-08 DIAGNOSIS — C679 Malignant neoplasm of bladder, unspecified: Secondary | ICD-10-CM | POA: Diagnosis not present

## 2020-04-08 DIAGNOSIS — Z8249 Family history of ischemic heart disease and other diseases of the circulatory system: Secondary | ICD-10-CM | POA: Diagnosis not present

## 2020-04-08 DIAGNOSIS — E785 Hyperlipidemia, unspecified: Secondary | ICD-10-CM | POA: Diagnosis present

## 2020-04-08 DIAGNOSIS — I10 Essential (primary) hypertension: Secondary | ICD-10-CM | POA: Diagnosis present

## 2020-04-08 DIAGNOSIS — K5790 Diverticulosis of intestine, part unspecified, without perforation or abscess without bleeding: Secondary | ICD-10-CM | POA: Diagnosis present

## 2020-04-08 DIAGNOSIS — E876 Hypokalemia: Secondary | ICD-10-CM | POA: Diagnosis present

## 2020-04-08 DIAGNOSIS — J302 Other seasonal allergic rhinitis: Secondary | ICD-10-CM | POA: Diagnosis present

## 2020-04-08 DIAGNOSIS — G459 Transient cerebral ischemic attack, unspecified: Secondary | ICD-10-CM | POA: Diagnosis present

## 2020-04-08 DIAGNOSIS — D649 Anemia, unspecified: Secondary | ICD-10-CM | POA: Diagnosis not present

## 2020-04-08 DIAGNOSIS — Z7902 Long term (current) use of antithrombotics/antiplatelets: Secondary | ICD-10-CM

## 2020-04-08 DIAGNOSIS — Z79899 Other long term (current) drug therapy: Secondary | ICD-10-CM | POA: Diagnosis not present

## 2020-04-08 DIAGNOSIS — K31811 Angiodysplasia of stomach and duodenum with bleeding: Secondary | ICD-10-CM | POA: Diagnosis not present

## 2020-04-08 DIAGNOSIS — Z86010 Personal history of colonic polyps: Secondary | ICD-10-CM | POA: Diagnosis not present

## 2020-04-08 DIAGNOSIS — Z8673 Personal history of transient ischemic attack (TIA), and cerebral infarction without residual deficits: Secondary | ICD-10-CM

## 2020-04-08 DIAGNOSIS — D62 Acute posthemorrhagic anemia: Secondary | ICD-10-CM | POA: Diagnosis present

## 2020-04-08 DIAGNOSIS — Z803 Family history of malignant neoplasm of breast: Secondary | ICD-10-CM

## 2020-04-08 DIAGNOSIS — Z90710 Acquired absence of both cervix and uterus: Secondary | ICD-10-CM | POA: Diagnosis not present

## 2020-04-08 DIAGNOSIS — Z91040 Latex allergy status: Secondary | ICD-10-CM

## 2020-04-08 DIAGNOSIS — Z7982 Long term (current) use of aspirin: Secondary | ICD-10-CM

## 2020-04-08 DIAGNOSIS — Z79891 Long term (current) use of opiate analgesic: Secondary | ICD-10-CM | POA: Diagnosis not present

## 2020-04-08 DIAGNOSIS — Z8551 Personal history of malignant neoplasm of bladder: Secondary | ICD-10-CM | POA: Diagnosis not present

## 2020-04-08 DIAGNOSIS — Z823 Family history of stroke: Secondary | ICD-10-CM | POA: Diagnosis not present

## 2020-04-08 DIAGNOSIS — K5730 Diverticulosis of large intestine without perforation or abscess without bleeding: Secondary | ICD-10-CM | POA: Diagnosis not present

## 2020-04-08 LAB — CBC WITH DIFFERENTIAL/PLATELET
Abs Immature Granulocytes: 0.01 10*3/uL (ref 0.00–0.07)
Basophils Absolute: 0 10*3/uL (ref 0.0–0.1)
Basophils Relative: 0 %
Eosinophils Absolute: 0 10*3/uL (ref 0.0–0.5)
Eosinophils Relative: 0 %
HCT: 19.8 % — ABNORMAL LOW (ref 36.0–46.0)
Hemoglobin: 5.9 g/dL — CL (ref 12.0–15.0)
Immature Granulocytes: 0 %
Lymphocytes Relative: 40 %
Lymphs Abs: 2.9 10*3/uL (ref 0.7–4.0)
MCH: 24.4 pg — ABNORMAL LOW (ref 26.0–34.0)
MCHC: 29.8 g/dL — ABNORMAL LOW (ref 30.0–36.0)
MCV: 81.8 fL (ref 80.0–100.0)
Monocytes Absolute: 0.5 10*3/uL (ref 0.1–1.0)
Monocytes Relative: 7 %
Neutro Abs: 3.8 10*3/uL (ref 1.7–7.7)
Neutrophils Relative %: 53 %
Platelets: 314 10*3/uL (ref 150–400)
RBC: 2.42 MIL/uL — ABNORMAL LOW (ref 3.87–5.11)
RDW: 20.6 % — ABNORMAL HIGH (ref 11.5–15.5)
WBC: 7.2 10*3/uL (ref 4.0–10.5)
nRBC: 0.6 % — ABNORMAL HIGH (ref 0.0–0.2)

## 2020-04-08 LAB — POC OCCULT BLOOD, ED: Fecal Occult Bld: POSITIVE — AB

## 2020-04-08 LAB — COMPREHENSIVE METABOLIC PANEL
ALT: 14 U/L (ref 0–44)
AST: 23 U/L (ref 15–41)
Albumin: 3.5 g/dL (ref 3.5–5.0)
Alkaline Phosphatase: 102 U/L (ref 38–126)
Anion gap: 11 (ref 5–15)
BUN: 21 mg/dL (ref 8–23)
CO2: 29 mmol/L (ref 22–32)
Calcium: 8.8 mg/dL — ABNORMAL LOW (ref 8.9–10.3)
Chloride: 95 mmol/L — ABNORMAL LOW (ref 98–111)
Creatinine, Ser: 0.8 mg/dL (ref 0.44–1.00)
GFR calc Af Amer: >60 mL/min (ref >60)
GFR calc non Af Amer: >60 mL/min (ref >60)
Glucose, Bld: 101 mg/dL — ABNORMAL HIGH (ref 70–99)
Potassium: 3.4 mmol/L — ABNORMAL LOW (ref 3.5–5.1)
Sodium: 135 mmol/L (ref 135–145)
Total Bilirubin: 0.4 mg/dL (ref 0.3–1.2)
Total Protein: 7.3 g/dL (ref 6.5–8.1)

## 2020-04-08 LAB — URINALYSIS, ROUTINE W REFLEX MICROSCOPIC
Bilirubin Urine: NEGATIVE
Glucose, UA: NEGATIVE mg/dL
Hgb urine dipstick: NEGATIVE
Ketones, ur: NEGATIVE mg/dL
Leukocytes,Ua: NEGATIVE
Nitrite: NEGATIVE
Protein, ur: NEGATIVE mg/dL
Specific Gravity, Urine: 1.012 (ref 1.005–1.030)
pH: 6 (ref 5.0–8.0)

## 2020-04-08 LAB — IRON AND TIBC
Iron: 30 ug/dL (ref 28–170)
Saturation Ratios: 6 % — ABNORMAL LOW (ref 10.4–31.8)
TIBC: 492 ug/dL — ABNORMAL HIGH (ref 250–450)
UIBC: 462 ug/dL

## 2020-04-08 LAB — RETICULOCYTES
Immature Retic Fract: 42.6 % — ABNORMAL HIGH (ref 2.3–15.9)
RBC.: 2.09 MIL/uL — ABNORMAL LOW (ref 3.87–5.11)
Retic Count, Absolute: 84.2 10*3/uL (ref 19.0–186.0)
Retic Ct Pct: 4 % — ABNORMAL HIGH (ref 0.4–3.1)

## 2020-04-08 LAB — PREPARE RBC (CROSSMATCH)

## 2020-04-08 LAB — VITAMIN B12: Vitamin B-12: 177 pg/mL — ABNORMAL LOW (ref 180–914)

## 2020-04-08 LAB — FERRITIN: Ferritin: 8 ng/mL — ABNORMAL LOW (ref 11–307)

## 2020-04-08 LAB — SARS CORONAVIRUS 2 BY RT PCR (HOSPITAL ORDER, PERFORMED IN ~~LOC~~ HOSPITAL LAB): SARS Coronavirus 2: NEGATIVE

## 2020-04-08 LAB — FOLATE: Folate: 9.2 ng/mL (ref >5.9)

## 2020-04-08 MED ORDER — METOCLOPRAMIDE HCL 5 MG/ML IJ SOLN: 10.0000 mg | Freq: Once | INTRAMUSCULAR | Status: DC

## 2020-04-08 MED ORDER — DIPHENHYDRAMINE HCL 50 MG/ML IJ SOLN: 25.0000 mg | Freq: Once | INTRAMUSCULAR | Status: DC

## 2020-04-08 MED ORDER — ONDANSETRON HCL 4 MG/2ML IJ SOLN: 4.0000 mg | Freq: Four times a day (QID) | INTRAMUSCULAR | Status: DC | PRN

## 2020-04-08 MED ORDER — POTASSIUM CHLORIDE IN NACL 20-0.9 MEQ/L-% IV SOLN
INTRAVENOUS | Status: DC
  Filled 2020-04-08 (×2): qty 1000

## 2020-04-08 MED ORDER — ONDANSETRON HCL 4 MG PO TABS: 4.0000 mg | ORAL_TABLET | Freq: Four times a day (QID) | ORAL | Status: DC | PRN

## 2020-04-08 MED ORDER — ACETAMINOPHEN 325 MG PO TABS
650.0000 mg | ORAL_TABLET | Freq: Four times a day (QID) | ORAL | Status: DC | PRN
  Administered 2020-04-09 – 2020-04-10 (×2): 650 mg via ORAL
  Filled 2020-04-08 (×2): qty 2

## 2020-04-08 MED ORDER — SODIUM CHLORIDE 0.9 % IV BOLUS
1000.0000 mL | Freq: Once | INTRAVENOUS | Status: AC
  Administered 2020-04-08: 1000 mL via INTRAVENOUS

## 2020-04-08 MED ORDER — ACETAMINOPHEN 650 MG RE SUPP: 650.0000 mg | Freq: Four times a day (QID) | RECTAL | Status: DC | PRN

## 2020-04-08 MED ORDER — SODIUM CHLORIDE 0.9 % IV SOLN
80.0000 mg | Freq: Once | INTRAVENOUS | Status: AC
  Administered 2020-04-08: 80 mg via INTRAVENOUS
  Filled 2020-04-08: qty 80

## 2020-04-08 MED ORDER — MECLIZINE HCL 25 MG PO TABS
25.0000 mg | ORAL_TABLET | Freq: Once | ORAL | Status: AC
  Administered 2020-04-08: 25 mg via ORAL
  Filled 2020-04-08: qty 1

## 2020-04-08 MED ORDER — SODIUM CHLORIDE 0.9 % IV SOLN
8.0000 mg/h | INTRAVENOUS | Status: DC
  Administered 2020-04-08 – 2020-04-10 (×4): 8 mg/h via INTRAVENOUS
  Filled 2020-04-08 (×6): qty 80

## 2020-04-08 MED ORDER — SODIUM CHLORIDE 0.9% IV SOLUTION: Freq: Once | INTRAVENOUS | Status: AC

## 2020-04-08 MED ORDER — SODIUM CHLORIDE 0.9% FLUSH
3.0000 mL | Freq: Two times a day (BID) | INTRAVENOUS | Status: DC
  Administered 2020-04-09 – 2020-04-10 (×4): 3 mL via INTRAVENOUS

## 2020-04-08 NOTE — ED Notes (Signed)
Pt returned from CT °

## 2020-04-08 NOTE — ED Provider Notes (Signed)
[facility] DEPT Provider Note   CSN: [id number] Arrival date & time: 04/08/20  1553     History Chief Complaint  Patient presents with  . Headache  . Nausea    [name] is a 74 y.o. female.  HPI   Patient presents for a 6-month complaint of lightheadedness and dizziness whenever she stands up to walk.  She said it is always worse when she first gets up and she has started to sit at the edge of her bed for a few minutes before she stands whenever she gets up in the morning.  She says this has gotten slightly worse over the last week so she decided come in.  She denies any focal neurological deficits no slurring of speech or mental confusion, no asymmetric weakness.  Medical history is complicated by coagulopathy with lower extremity clots followed by vascular, she is on dual antiplatelet therapy for this and takes her medication regularly as prescribed.  Also history of bladder cancer for which she takes intermittent chemo treatments.  She does say that she was told she had mild hematuria at her last cancer appointment and she is notes that her stool has been getting much darker over the last few weeks.  She denies any diarrhea or frank blood, she does say that she has a bowel movement approximately every 3 to 4 days, she takes a daily MiraLAX and to over-the-counter stool softeners and then every 3 to 4 days we will add a docusate or Senokot which produces a bowel movement with no pain.  She denies any no known trauma or head injury, she says she has a mild tension to her back of her head on occasion but denies any current pain right now.  Denies any fevers or no known sick contacts, has had no rash or change in her vision.  She does have chronic vision loss in 1 side of her eye.   Past Medical History:  Diagnosis Date  . Abdominal wall hernia   . Blood transfusion    30+ yrs. ago  . Cancer (HCC)    Right Breast  . Hyperlipidemia   . Hypertension     . Peripheral vascular disease ([place])   . Seasonal allergies   . TIA (transient ischemic attack)     Patient Active Problem List   Diagnosis Date Noted  . PAD (peripheral artery disease) ([place]) 01/27/2020  . Tubular adenoma 05/27/2019  . Diverticulosis 05/24/2019  . Cataract 12/09/2018  . TIA (transient ischemic attack) 01/07/2013  . Carotid stenosis, asymptomatic 01/07/2013  . Atherosclerosis of native artery of extremity with intermittent claudication ([place]) 05/12/2012  . Hypertension   . Tobacco abuse 12/03/2011  . PVD (peripheral vascular disease) ([place]) 08/13/2011  . History of breast cancer 08/13/2011  . Allergic rhinitis due to pollen 08/13/2011    Past Surgical History:  Procedure Laterality Date  . ABDOMINAL AORTOGRAM W/LOWER EXTREMITY N/A 01/10/2020   Procedure: ABDOMINAL AORTOGRAM W/LOWER EXTREMITY;  Surgeon: Serafina Mitchell, MD;  Location: [facility];  Service: Cardiovascular;  Laterality: N/A;  Bilateral   . ABDOMINAL HYSTERECTOMY    . Aortobifemoral BPG  05/15/11   Using a 14 x8 bifurcated Dacron Graft  . BREAST SURGERY     right lumpectomy  . BREAST SURGERY     multiple breast surgeries  . CATARACT EXTRACTION Right 10/2018  . COLONOSCOPY  2012   [facility]  . CYSTOSCOPY W/ RETROGRADES Bilateral 05/27/2019   Procedure: CYSTOSCOPY WITH BILATERAL  RETROGRADE PYELOGRAM;  Surgeon: [name], MD;  Location: [facility];  Service: Urology;  Laterality: Bilateral;  . CYSTOSCOPY WITH BIOPSY N/A 05/27/2019   Procedure: CYSTOSCOPY WITH BLADDER  BIOPSY;  Surgeon: [name], MD;  Location: [facility];  Service: Urology;  Laterality: N/A;  . DILATION AND CURETTAGE OF UTERUS     3-4 in late 20"s  . ENDARTERECTOMY FEMORAL Left 01/27/2020   REDO OF LEFT ENDARTERECTOMY FEMORAL (Left )  . ENDARTERECTOMY FEMORAL Left 01/27/2020   Procedure: REDO OF LEFT ENDARTERECTOMY FEMORAL;  Surgeon: Serafina Mitchell, MD;  Location: [facility];  Service: Vascular;  Laterality: Left;  .  HERNIA REPAIR    . NM MYOCAR PERF WALL MOTION  03/11/2011   mild to moderate perfusion defect in the apical and apical lateral region c/w an infarct/scar  . PATCH ANGIOPLASTY Left 01/27/2020   Procedure: [place];  Surgeon: Serafina Mitchell, MD;  Location: [facility];  Service: Vascular;  Laterality: Left;  [name] VASCULAR SURGERY  05/15/11   aobifemoral bypass graft  . VENTRAL HERNIA REPAIR  12/26/2011   Procedure: LAPAROSCOPIC VENTRAL HERNIA;  Surgeon: Adin Hector, MD;  Location: [facility];  Service: General;  Laterality: N/A;  With Mesh     OB History   No obstetric history on file.     Family History  Problem Relation Age of Onset  . Heart attack Sister   . Heart attack Brother   . Stroke Sister   . Cancer Mother        BREAST, BRAIN  . Heart disease Mother   . Cancer Paternal Aunt        breast ca  . Cancer Paternal Grandmother        unknown ca    Social History   Tobacco Use  . Smoking status: Current Some Day Smoker    Packs/day: 0.50    Years: 30.00    Pack years: 15.00    Types: Cigarettes  . Smokeless tobacco: Never Used  . Tobacco comment: pt states that she uses the smokeless cig and is trying to quit  Substance Use Topics  . Alcohol use: No    Comment: occasional alcohol intake  . Drug use: No    Home Medications Prior to Admission medications   Medication Sig Start Date End Date Taking? Authorizing Provider  acetaminophen (TYLENOL) 500 MG tablet Take 1,000 mg by mouth every 6 (six) hours as needed for mild pain or moderate pain.    [provider]  aspirin EC 81 MG tablet Take 81 mg by mouth every evening.     [provider]  atorvastatin (LIPITOR) 20 MG tablet TAKE 1 TABLET BY MOUTH ONCE DAILY Patient taking differently: Take 20 mg by mouth every evening.  04/04/19   Denita Lung, MD  Camphor-Eucalyptus-Menthol (VICKS VAPORUB) 4.73-1.2-2.6 % OINT Apply 1 application topically 3 (three) times  daily as needed (leg discomfort).    [provider]  Cholecalciferol (VITAMIN D) 50 MCG (2000 UT) tablet Take 2,000 Units by mouth every evening.     [provider]  clopidogrel (PLAVIX) 75 MG tablet Take 1 tablet (75 mg total) by mouth daily. Resume once you no longer have any bleeding in your urine or in your stool and you have completed all your scheduled procedures. Patient taking differently: Take 75 mg by mouth every evening. Resume once you no longer have any bleeding in your urine or in your stool  and you have completed all your scheduled procedures. 12/06/19   Denita Lung, MD  diphenhydrAMINE HCl (BENADRYL ITCH STOPPING EX) Apply 1 application topically 4 (four) times daily as needed (skin rash/irritation.).    [provider]  hydrochlorothiazide (HYDRODIURIL) 25 MG tablet TAKE 1 TABLET BY MOUTH EVERY DAY 03/13/20   Denita Lung, MD  hydroxypropyl methylcellulose / hypromellose (ISOPTO TEARS / GONIOVISC) 2.5 % ophthalmic solution Place 1 drop into both eyes 4 (four) times daily as needed for dry eyes (dry/irritated eyes.).     [provider]  Multiple Vitamin (MULTIVITAMIN) LIQD Take 15 mLs by mouth 2 (two) times a week. Nature's Plus Source of Life Gold Liquid    [provider]  oxyCODONE (OXY IR/ROXICODONE) 5 MG immediate release tablet Take 1 tablet (5 mg total) by mouth every 6 (six) hours as needed for moderate pain. 01/28/20   Ulyses Amor, PA-C  Phenazopyridine HCl (AZO STANDARD MAXIMUM STRENGTH PO) Take 1 tablet by mouth 3 (three) times daily as needed (urinary discomfort with bladder treatment).    [provider]  simethicone (MYLICON) 0000000 MG chewable tablet Chew 125 mg by mouth every 6 (six) hours as needed for flatulence.    [provider]  potassium chloride (K-DUR) 10 MEQ tablet Take 2 tablets (20 mEq total) by mouth 2 (two) times daily. 12/19/11 01/27/12  Michael Boston, MD    Allergies    Latex  Review  of Systems   Review of Systems  Constitutional: Positive for fatigue.  HENT: Negative.   Eyes: Positive for visual disturbance.       Chronic vision loss   Respiratory: Negative.   Cardiovascular: Negative.   Gastrointestinal: Positive for constipation. Negative for abdominal distention, abdominal pain, anal bleeding, blood in stool, diarrhea, nausea, rectal pain and vomiting.       Darkening stool  Genitourinary: Positive for hematuria. Negative for decreased urine volume, difficulty urinating, dyspareunia, dysuria, enuresis, flank pain, frequency, genital sores, menstrual problem, pelvic pain, urgency, vaginal bleeding, vaginal discharge and vaginal pain.       Reported hematuria from urologist  Musculoskeletal: Negative.   Skin: Negative.   Neurological: Positive for dizziness, weakness, light-headedness and headaches. Negative for tremors, seizures, syncope, facial asymmetry, speech difficulty and numbness.  Psychiatric/Behavioral: Negative.     Physical Exam Updated Vital Signs BP (!) 125/92   Pulse 96   Temp 99 F (37.2 C) (Oral)   Resp 13   Ht 5\' 3"  (1.6 m)   Wt 60.8 kg   SpO2 100%   BMI 23.74 kg/m   Physical Exam Vitals and nursing note reviewed.  Constitutional:      Appearance: She is well-developed and normal weight.  HENT:     Head: Normocephalic.  Eyes:     General: No scleral icterus.    Extraocular Movements: Extraocular movements intact.     Right eye: No nystagmus.     Left eye: No nystagmus.  Cardiovascular:     Rate and Rhythm: Normal rate and regular rhythm.     Heart sounds: Normal heart sounds.  Pulmonary:     Effort: Pulmonary effort is normal. No respiratory distress.     Breath sounds: Normal breath sounds. No stridor. No wheezing or rales.  Abdominal:     General: There is no distension.     Palpations: Abdomen is soft. There is no mass.  Musculoskeletal:        General: Normal range of motion.  Skin:  General: Skin is warm.      Capillary Refill: Capillary refill takes less than 2 seconds.  Neurological:     Mental Status: She is alert.     Cranial Nerves: No cranial nerve deficit or facial asymmetry.     Gait: Gait abnormal.  Psychiatric:        Mood and Affect: Mood normal. Mood is not anxious or depressed.        Speech: Speech normal.        Behavior: Behavior normal. Behavior is not agitated.        Cognition and Memory: Cognition is not impaired. Memory is not impaired.     ED Results / Procedures / Treatments   Labs (all labs ordered are listed, but only abnormal results are displayed) Labs Reviewed  CBC WITH DIFFERENTIAL/PLATELET - Abnormal; Notable for the following components:      Result Value   RBC 2.42 (*)    Hemoglobin 5.9 (*)    HCT 19.8 (*)    MCH 24.4 (*)    MCHC 29.8 (*)    RDW 20.6 (*)    nRBC 0.6 (*)    All other components within normal limits  COMPREHENSIVE METABOLIC PANEL - Abnormal; Notable for the following components:   Potassium 3.4 (*)    Chloride 95 (*)    Glucose, Bld 101 (*)    Calcium 8.8 (*)    All other components within normal limits  VITAMIN B12  FOLATE  IRON AND TIBC  FERRITIN  RETICULOCYTES  URINALYSIS, ROUTINE W REFLEX MICROSCOPIC  POC OCCULT BLOOD, ED  TYPE AND SCREEN  PREPARE RBC (CROSSMATCH)    EKG EKG Interpretation  Date/Time:  Sunday Apr 08 2020 17:58:31 EDT Ventricular Rate:  89 PR Interval:    QRS Duration: 85 QT Interval:  390 QTC Calculation: 475 R Axis:   49 Text Interpretation: Sinus rhythm Abnormal R-wave progression, early transition Borderline T abnormalities, lateral leads No significant change since last tracing Confirmed by Wandra Arthurs [phone]) on 04/08/2020 6:39:03 PM   Radiology CT Head Wo Contrast  Result Date: 04/08/2020 CLINICAL DATA:  Headache, dizziness, nausea for 3 days EXAM: CT HEAD WITHOUT CONTRAST TECHNIQUE: Contiguous axial images were obtained from the base of the skull through the vertex without intravenous  contrast. COMPARISON:  02/14/2013 FINDINGS: Brain: No acute infarct or hemorrhage. Lateral ventricles and midline structures are unremarkable. There are chronic small vessel ischemic changes scattered throughout the periventricular white matter. No acute extra-axial fluid collections. No mass effect. Vascular: No hyperdense vessel or unexpected calcification. Skull: Normal. Negative for fracture or focal lesion. Sinuses/Orbits: Chronic polypoid mucosal thickening within a posterior left ethmoid air cell. Remaining sinuses are clear. Other: None. IMPRESSION: 1. No acute intracranial process. Electronically Signed   By: Randa Ngo M.D.   On: 04/08/2020 18:59    Procedures Procedures (including critical care time)  Medications Ordered in ED Medications  pantoprazole (PROTONIX) 80 mg in sodium chloride 0.9 % 100 mL IVPB (has no administration in time range)  pantoprazole (PROTONIX) 80 mg in sodium chloride 0.9 % 100 mL (0.8 mg/mL) infusion (has no administration in time range)  0.9 %  sodium chloride infusion (Manually program via Guardrails IV Fluids) (has no administration in time range)  sodium chloride 0.9 % bolus 1,000 mL (1,000 mLs Intravenous New Bag/Given (Non-Interop) 04/08/20 1827)  meclizine (ANTIVERT) tablet 25 mg (25 mg Oral Given 04/08/20 1809)    ED Course  I have reviewed the triage vital signs  and the nursing notes.  Pertinent labs & imaging results that were available during my care of the patient were reviewed by me and considered in my medical decision making (see chart for details).    MDM Rules/Calculators/A&P                      Vital signs stable, patient does exhibit signs of imbalance and vertigo when standing.  We will do basic lab work, CT head and orthostatic vitals.  Considering orthostatic hypotension, new head mass or bleed, and potential anemia from urologic versus GI bleed.  do not expect trauma in this patient there are no focal neurological  deficits  7:30pm: Patient visited discuss hemoglobin of 5.9.  With her husband present, she consents to blood transfusion which is now ordered.  We will get UA and FOBT along with anemia panel, and start Protonix.  8:30pm spoke with GI covering for Dr. Collene Mares, plan is to continue with Protonix bolus and infusion, continue with PRBC transfusion, make n.p.o. at midnight and Dr. Collene Mares can see in the morning  9pm: Spoke with admitting hospitalist and relayed GIs recommendations, they will come and evaluate for admission   Final Clinical Impression(s) / ED Diagnoses Final diagnoses:  Orthostatic hypotension  Symptomatic anemia    Rx / DC Orders ED Discharge Orders    None       Sherene Sires, DO 04/08/20 2059    Drenda Freeze, MD 04/11/20 1600

## 2020-04-08 NOTE — ED Notes (Signed)
ED Provider at bedside. 

## 2020-04-08 NOTE — ED Notes (Signed)
Date and time results received: 04/08/20 7:20 PM  (use smartphrase ".now" to insert current time)  Test: Hemoglobin Critical Value: 5.9  Name of Provider Notified: Dr.Yao  Orders Received? Or Actions Taken?:

## 2020-04-08 NOTE — ED Notes (Signed)
Pt stuck twice for IV access and blood work, both unsuccessful.  Gennette Pac Rn made aware for ultrasound IV.

## 2020-04-08 NOTE — H&P (Signed)
History and Physical    [name] [id number] DOB: 09-Apr-1946 DOA: 04/08/2020  PCP: Denita Lung, MD   Patient coming from: Home   Chief Complaint: Head pressure, lightheadedness, fatigue, dark stool   HPI: [name] is a 74 y.o. female with medical history significant for hypertension, peripheral arterial disease status post revascularization, bladder cancer undergoing treatment, and history of TIA, now presenting to the emergency department with lightheadedness, headache, fatigue, and dark stool.  Patient reports insidious development of fatigue, lightheadedness, headache over several weeks.  Headache is described as a pressure sensation that is worse when she stands up or is active, and without any associated change in vision or hearing or focal numbness or weakness.  Lightheadedness occurs upon standing and primarily but she has not lost consciousness.  She also has increased fatigue.  She noted that her stool has been much darker beginning several days ago.  Patient takes aspirin and Plavix daily, does not use a PPI or H2 blocker, denies any abdominal pain, nausea, or vomiting, and denies any red or maroon blood in her stool.  ED Course: Upon arrival to the ED, patient is found to be afebrile, saturating well on room air, and with stable blood pressure.  EKG features sinus rhythm.  Noncontrast head CT is negative for acute intracranial abnormality.  Chemistry panel with potassium 3.4.  CBC notable for hemoglobin of 5.9, down from 9.8 in March.  Fecal occult blood testing is positive.  Patient was given a liter of saline, 80 mg IV Protonix, was started on Protonix infusion, and 1 unit of RBC was ordered for transfusion.  Gastroenterology was consulted by the ED physician and recommended medical admission.  Review of Systems:  All other systems reviewed and apart from HPI, are negative.  Past Medical History:  Diagnosis Date  . Abdominal wall hernia   . Blood transfusion      30+ yrs. ago  . Cancer (HCC)    Right Breast  . Hyperlipidemia   . Hypertension   . Peripheral vascular disease ([place])   . Seasonal allergies   . TIA (transient ischemic attack)     Past Surgical History:  Procedure Laterality Date  . ABDOMINAL AORTOGRAM W/LOWER EXTREMITY N/A 01/10/2020   Procedure: ABDOMINAL AORTOGRAM W/LOWER EXTREMITY;  Surgeon: Serafina Mitchell, MD;  Location: [facility];  Service: Cardiovascular;  Laterality: N/A;  Bilateral   . ABDOMINAL HYSTERECTOMY    . Aortobifemoral BPG  05/15/11   Using a 14 x8 bifurcated Dacron Graft  . BREAST SURGERY     right lumpectomy  . BREAST SURGERY     multiple breast surgeries  . CATARACT EXTRACTION Right 10/2018  . COLONOSCOPY  2012   [facility]  . CYSTOSCOPY W/ RETROGRADES Bilateral 05/27/2019   Procedure: CYSTOSCOPY WITH BILATERAL RETROGRADE PYELOGRAM;  Surgeon: [name], MD;  Location: [facility];  Service: Urology;  Laterality: Bilateral;  . CYSTOSCOPY WITH BIOPSY N/A 05/27/2019   Procedure: CYSTOSCOPY WITH BLADDER  BIOPSY;  Surgeon: [name], MD;  Location: [facility];  Service: Urology;  Laterality: N/A;  . DILATION AND CURETTAGE OF UTERUS     3-4 in late 20"s  . ENDARTERECTOMY FEMORAL Left 01/27/2020   REDO OF LEFT ENDARTERECTOMY FEMORAL (Left )  . ENDARTERECTOMY FEMORAL Left 01/27/2020   Procedure: REDO OF LEFT ENDARTERECTOMY FEMORAL;  Surgeon: Serafina Mitchell, MD;  Location: [facility];  Service: Vascular;  Laterality: Left;  . HERNIA REPAIR    . NM  MYOCAR PERF WALL MOTION  03/11/2011   mild to moderate perfusion defect in the apical and apical lateral region c/w an infarct/scar  . PATCH ANGIOPLASTY Left 01/27/2020   Procedure: [name];  Surgeon: Serafina Mitchell, MD;  Location: [facility];  Service: Vascular;  Laterality: Left;  [name] VASCULAR SURGERY  05/15/11   aobifemoral bypass graft  . VENTRAL HERNIA REPAIR  12/26/2011   Procedure: LAPAROSCOPIC VENTRAL HERNIA;   Surgeon: Adin Hector, MD;  Location: [facility];  Service: General;  Laterality: N/A;  With Mesh     reports that she has been smoking cigarettes. She has a 15.00 pack-year smoking history. She has never used smokeless tobacco. She reports that she does not drink alcohol or use drugs.  Allergies  Allergen Reactions  . Latex Rash    Family History  Problem Relation Age of Onset  . Heart attack Sister   . Heart attack Brother   . Stroke Sister   . Cancer Mother        BREAST, BRAIN  . Heart disease Mother   . Cancer Paternal Aunt        breast ca  . Cancer Paternal Grandmother        unknown ca     Prior to Admission medications   Medication Sig Start Date End Date Taking? Authorizing Provider  acetaminophen (TYLENOL) 500 MG tablet Take 1,000 mg by mouth every 6 (six) hours as needed for mild pain or moderate pain.   Yes [provider]  aspirin EC 81 MG tablet Take 81 mg by mouth every evening.    Yes [provider]  atorvastatin (LIPITOR) 20 MG tablet TAKE 1 TABLET BY MOUTH ONCE DAILY Patient taking differently: Take 20 mg by mouth every evening.  04/04/19  Yes Denita Lung, MD  Camphor-Eucalyptus-Menthol (VICKS VAPORUB) 4.73-1.2-2.6 % OINT Apply 1 application topically 3 (three) times daily as needed (leg discomfort).   Yes [provider]  Cholecalciferol (VITAMIN D) 50 MCG (2000 UT) tablet Take 2,000 Units by mouth every evening.    Yes [provider]  clopidogrel (PLAVIX) 75 MG tablet Take 1 tablet (75 mg total) by mouth daily. Resume once you no longer have any bleeding in your urine or in your stool and you have completed all your scheduled procedures. Patient taking differently: Take 75 mg by mouth every evening. Resume once you no longer have any bleeding in your urine or in your stool and you have completed all your scheduled procedures. 12/06/19  Yes Denita Lung, MD  diphenhydrAMINE HCl (BENADRYL ITCH STOPPING EX) Apply 1  application topically 4 (four) times daily as needed (skin rash/irritation.).   Yes [provider]  docusate sodium (COLACE) 100 MG capsule Take 100 mg by mouth 2 (two) times daily.   Yes [provider]  hydrochlorothiazide (HYDRODIURIL) 25 MG tablet TAKE 1 TABLET BY MOUTH EVERY DAY Patient taking differently: Take 25 mg by mouth daily.  03/13/20  Yes Denita Lung, MD  hydroxypropyl methylcellulose / hypromellose (ISOPTO TEARS / GONIOVISC) 2.5 % ophthalmic solution Place 1 drop into both eyes 4 (four) times daily as needed for dry eyes.    Yes [provider]  Loratadine 10 MG CAPS Take 1 capsule by mouth daily.   Yes [provider]  Multiple Vitamin (MULTIVITAMIN) LIQD Take 15 mLs by mouth 2 (two) times a week. Nature's Plus Source of Life Gold Liquid   Yes [provider]  oxyCODONE (OXY IR/ROXICODONE) 5 MG immediate release tablet Take 1 tablet (5 mg total) by mouth every 6 (six) hours as needed for moderate pain. 01/28/20  Yes Ulyses Amor, PA-C  Phenazopyridine HCl (AZO STANDARD MAXIMUM STRENGTH PO) Take 1 tablet by mouth 3 (three) times daily as needed (urinary discomfort with bladder treatment).   Yes [provider]  polyethylene glycol (MIRALAX / GLYCOLAX) 17 g packet Take 17 g by mouth daily.   Yes [provider]  senna (SENOKOT) 8.6 MG tablet Take 1 tablet by mouth daily.   Yes [provider]  simethicone (MYLICON) 0000000 MG chewable tablet Chew 125 mg by mouth every 6 (six) hours as needed for flatulence.   Yes [provider]  potassium chloride (K-DUR) 10 MEQ tablet Take 2 tablets (20 mEq total) by mouth 2 (two) times daily. 12/19/11 01/27/12  Michael Boston, MD    Physical Exam: Vitals:   04/08/20 1756 04/08/20 1800 04/08/20 1930 04/08/20 2105  BP: 131/72 (!) 125/92 120/77 121/69  Pulse: (!) 106 96 92 91  Resp: 20 13 19  (!) 24  Temp: 99 F (37.2 C)     TempSrc: Oral     SpO2: 100% 100% 97% 100%    Weight:      Height:        Constitutional: NAD, calm  Eyes: PERTLA, lids and conjunctivae normal ENMT: Mucous membranes are moist. Posterior pharynx clear of any exudate or lesions.   Neck: normal, supple, no masses, no thyromegaly Respiratory:  no wheezing, no crackles. No accessory muscle use.  Cardiovascular: S1 & S2 heard, regular rate and rhythm. No extremity edema.   Abdomen: No distension, no tenderness, soft. Bowel sounds active.  Musculoskeletal: no clubbing / cyanosis. No joint deformity upper and lower extremities.   Skin: no significant rashes, lesions, ulcers. Warm, dry, well-perfused. Neurologic: No facial asymmetry. Sensation intact. Moving all extremities.  Psychiatric: Alert and oriented to person, place, and situation. Pleasant and cooperative.    Labs and Imaging on Admission: I have personally reviewed following labs and imaging studies  CBC: Recent Labs  Lab 04/08/20 1828  WBC 7.2  NEUTROABS 3.8  HGB 5.9*  HCT 19.8*  MCV 81.8  PLT [id number]   Basic Metabolic Panel: Recent Labs  Lab 04/08/20 1828  NA 135  K 3.4*  CL 95*  CO2 29  GLUCOSE 101*  BUN 21  CREATININE 0.80  CALCIUM 8.8*   GFR: Estimated Creatinine Clearance: 51.8 mL/min (by C-G formula based on SCr of 0.8 mg/dL). Liver Function Tests: Recent Labs  Lab 04/08/20 1828  AST 23  ALT 14  ALKPHOS 102  BILITOT 0.4  PROT 7.3  ALBUMIN 3.5   No results for input(s): LIPASE, AMYLASE in the last 168 hours. No results for input(s): AMMONIA in the last 168 hours. Coagulation Profile: No results for input(s): INR, PROTIME in the last 168 hours. Cardiac Enzymes: No results for input(s): CKTOTAL, CKMB, CKMBINDEX, TROPONINI in the last 168 hours. BNP (last 3 results) No results for input(s): PROBNP in the last 8760 hours. HbA1C: No results for input(s): HGBA1C in the last 72 hours. CBG: No results for input(s): GLUCAP in the last 168 hours. Lipid Profile: No results for input(s): CHOL, HDL,  LDLCALC, TRIG, CHOLHDL, LDLDIRECT in the last 72 hours. Thyroid Function Tests: No results for input(s): TSH, T4TOTAL, FREET4, T3FREE, THYROIDAB in the last 72 hours. Anemia Panel: Recent Labs    04/08/20 2013  RETICCTPCT 4.0*   Urine  analysis:    Component Value Date/Time   COLORURINE YELLOW 04/08/2020 2013   APPEARANCEUR CLEAR 04/08/2020 2013   LABSPEC 1.012 04/08/2020 2013   PHURINE 6.0 04/08/2020 2013   GLUCOSEU NEGATIVE 04/08/2020 2013   HGBUR NEGATIVE 04/08/2020 2013   BILIRUBINUR NEGATIVE 04/08/2020 2013   BILIRUBINUR neg 08/13/2011 [facility] 04/08/2020 2013   PROTEINUR NEGATIVE 04/08/2020 2013   UROBILINOGEN neg 08/13/2011 1229   UROBILINOGEN 0.2 06/02/2011 0942   NITRITE NEGATIVE 04/08/2020 2013   LEUKOCYTESUR NEGATIVE 04/08/2020 2013   Sepsis Labs: @LABRCNTIP (procalcitonin:4,lacticidven:4) )No results found for this or any previous visit (from the past 240 hour(s)).   Radiological Exams on Admission: CT Head Wo Contrast  Result Date: 04/08/2020 CLINICAL DATA:  Headache, dizziness, nausea for 3 days EXAM: CT HEAD WITHOUT CONTRAST TECHNIQUE: Contiguous axial images were obtained from the base of the skull through the vertex without intravenous contrast. COMPARISON:  02/14/2013 FINDINGS: Brain: No acute infarct or hemorrhage. Lateral ventricles and midline structures are unremarkable. There are chronic small vessel ischemic changes scattered throughout the periventricular white matter. No acute extra-axial fluid collections. No mass effect. Vascular: No hyperdense vessel or unexpected calcification. Skull: Normal. Negative for fracture or focal lesion. Sinuses/Orbits: Chronic polypoid mucosal thickening within a posterior left ethmoid air cell. Remaining sinuses are clear. Other: None. IMPRESSION: 1. No acute intracranial process. Electronically Signed   By: Randa Ngo M.D.   On: 04/08/2020 18:59    EKG: Independently reviewed. Sinus rhythm.    Assessment/Plan   1. Symptomatic anemia; GI bleeding  - Presents with progressive lightheadedness and fatigue over weeks, notes several days of dark stool, is found to be FOBT-positive with Hgb 5.9 (down from 9.8 in March)  - She is hemodynamically stable  - Stool appeared normal on DRE per ED resident  - No abdominal pain or vomiting and BUN is normal  - Most likely slow UGIB related to DAPT - She was started on IV PPI in ED and one unit RBC ordered for transfusion  - GI consulting and much appreciated  - Continue IV Protonix, hold antiplatelets, check post-transfusion H&H   2. PAD - Status-post revascularization, no acute ischemia  - Antiplatelets held while addressing GI bleed   3. Hypertension  - BP at goal  - Treat as-needed only for now    4. Bladder cancer  - Undergoing treatment with [facility]    5. Hypokalemia  - KCl added to IVF, repeat chem panel in am     DVT prophylaxis: SCDs Code Status: Full  Family Communication: Husband updated at bedside Disposition Plan:  Patient is from: Home  Anticipated d/c is to: Home  Anticipated d/c date is: 04/10/20 Patient currently: Pending GI consultation and blood transfusion  Consults called: GI consulted by ED physician  Admission status: Inpatient     Vianne Bulls, MD [facility] Pager: See [email or web address]  If 7AM-7PM, please contact the daytime attending [email or web address]  04/08/2020, 9:24 PM

## 2020-04-08 NOTE — ED Triage Notes (Signed)
Patient reports she has had headache, dizziness and nausea for three days. Patient reports pressure in her head when she walks around. Patient denies pain in triage.

## 2020-04-09 DIAGNOSIS — I739 Peripheral vascular disease, unspecified: Secondary | ICD-10-CM

## 2020-04-09 LAB — BASIC METABOLIC PANEL
Anion gap: 8 (ref 5–15)
BUN: 17 mg/dL (ref 8–23)
CO2: 25 mmol/L (ref 22–32)
Calcium: 8.1 mg/dL — ABNORMAL LOW (ref 8.9–10.3)
Chloride: 104 mmol/L (ref 98–111)
Creatinine, Ser: 0.74 mg/dL (ref 0.44–1.00)
GFR calc Af Amer: >60 mL/min (ref >60)
GFR calc non Af Amer: >60 mL/min (ref >60)
Glucose, Bld: 85 mg/dL (ref 70–99)
Potassium: 3.5 mmol/L (ref 3.5–5.1)
Sodium: 137 mmol/L (ref 135–145)

## 2020-04-09 LAB — HEMOGLOBIN AND HEMATOCRIT, BLOOD
HCT: 20.7 % — ABNORMAL LOW (ref 36.0–46.0)
HCT: 24.4 % — ABNORMAL LOW (ref 36.0–46.0)
Hemoglobin: 6.2 g/dL — CL (ref 12.0–15.0)
Hemoglobin: 7.7 g/dL — ABNORMAL LOW (ref 12.0–15.0)

## 2020-04-09 LAB — MAGNESIUM: Magnesium: 2 mg/dL (ref 1.7–2.4)

## 2020-04-09 LAB — PREPARE RBC (CROSSMATCH)

## 2020-04-09 MED ORDER — SODIUM CHLORIDE 0.9% IV SOLUTION: Freq: Once | INTRAVENOUS | Status: DC

## 2020-04-09 MED ORDER — ATORVASTATIN CALCIUM 20 MG PO TABS
20.0000 mg | ORAL_TABLET | Freq: Every day | ORAL | Status: DC
  Administered 2020-04-09 – 2020-04-11 (×3): 20 mg via ORAL
  Filled 2020-04-09 (×3): qty 1

## 2020-04-09 NOTE — H&P (View-Only) (Signed)
Reason for Consult: Severe anemia with guaiac positive stools. Referring Physician: [facility].  [name] is an 74 y.o. female.  HPI: Ms. [name] is a 74 year old black female with multiple medical problems listed below who presented to [facility] emergency room yesterday with a 1 week history of weakness and lightheadedness along with melenic stools.  She gives a history of similar problems over a month ago when she had dark stools for about 3 weeks.  She denies having any syncope or near syncopal events.  In addition to the weakness and fatigue she has had a headache but denies any loss of consciousness or visual problems.  She is on aspirin and Plavix for peripheral artery disease/TIA.  She denies having any abdominal pain, nausea, vomiting, coffee-ground emesis/hematemesis or bright red bleeding per rectum.  In the emergency room she was found to have a hemoglobin of 5.9 g/dL which drifted down from 9.8 g/dL in March of this year and she was FOBT positive. GI consultation was requested. Patient is well-known to me for several years now. She had a colonoscopy on 05/23/2019 when she was noted to have pandiverticulosis and a tubular adenoma was removed. She had a tubular adenoma removed her previous colonoscopy done in 2012. She never had a EGD. A CT scan of the head done in the emergency room no acute abnormalities.  She was also transfused 1 unit of packed red blood cells. She is also being treated for bladder cancer at [facility]. Patient denies use of OTC nonsteroidals. She uses Tylenol occasionally as needed. There is no previous history of ulcer disease.   Past Medical History:  Diagnosis Date  . Abdominal wall hernia   . Blood transfusion    30+ yrs. ago  . Cancer (HCC)    Right Breast  . Hyperlipidemia   . Hypertension   . Peripheral vascular disease ([place])   . Seasonal allergies   . TIA (transient ischemic attack)    Past Surgical History:  Procedure  Laterality Date  . ABDOMINAL AORTOGRAM W/LOWER EXTREMITY N/A 01/10/2020   Procedure: ABDOMINAL AORTOGRAM W/LOWER EXTREMITY;  Surgeon: Serafina Mitchell, MD;  Location: [facility];  Service: Cardiovascular;  Laterality: N/A;  Bilateral   . ABDOMINAL HYSTERECTOMY    . Aortobifemoral BPG  05/15/11   Using a 14 x8 bifurcated Dacron Graft  . BREAST SURGERY     right lumpectomy  . BREAST SURGERY     multiple breast surgeries  . CATARACT EXTRACTION Right 10/2018  . COLONOSCOPY  2012   Darien Kading  . CYSTOSCOPY W/ RETROGRADES Bilateral 05/27/2019   Procedure: CYSTOSCOPY WITH BILATERAL RETROGRADE PYELOGRAM;  Surgeon: [name], MD;  Location: [facility];  Service: Urology;  Laterality: Bilateral;  . CYSTOSCOPY WITH BIOPSY N/A 05/27/2019   Procedure: CYSTOSCOPY WITH BLADDER  BIOPSY;  Surgeon: [name], MD;  Location: [facility];  Service: Urology;  Laterality: N/A;  . DILATION AND CURETTAGE OF UTERUS     3-4 in late 20"s  . ENDARTERECTOMY FEMORAL Left 01/27/2020   REDO OF LEFT ENDARTERECTOMY FEMORAL (Left )  . ENDARTERECTOMY FEMORAL Left 01/27/2020   Procedure: REDO OF LEFT ENDARTERECTOMY FEMORAL;  Surgeon: Serafina Mitchell, MD;  Location: [place];  Service: Vascular;  Laterality: Left;  . HERNIA REPAIR    . NM MYOCAR PERF WALL MOTION  03/11/2011   mild to moderate perfusion defect in the apical and apical lateral region c/w an infarct/scar  . PATCH ANGIOPLASTY Left 01/27/2020  Procedure: [place];  Surgeon: Serafina Mitchell, MD;  Location: [place];  Service: Vascular;  Laterality: Left;  [name] VASCULAR SURGERY  05/15/11   aobifemoral bypass graft  . VENTRAL HERNIA REPAIR  12/26/2011   Procedure: LAPAROSCOPIC VENTRAL HERNIA;  Surgeon: Adin Hector, MD;  Location: [facility];  Service: General;  Laterality: N/A;  With Mesh   Family History  Problem Relation Age of Onset  . Heart attack Sister   . Heart attack Brother   . Stroke Sister   .  Cancer Mother        BREAST, BRAIN  . Heart disease Mother   . Cancer Paternal Aunt        breast ca  . Cancer Paternal Grandmother        unknown ca   Social History:  reports that she has been smoking cigarettes. She has a 15.00 pack-year smoking history. She has never used smokeless tobacco. She reports that she does not drink alcohol or use drugs.  Allergies:  Allergies  Allergen Reactions  . Latex Rash   Medications: I have reviewed the patient's current medications.  Results for orders placed or performed during the hospital encounter of 04/08/20 (from the past 48 hour(s))  CBC with Differential/Platelet     Status: Abnormal   Collection Time: 04/08/20  6:28 PM  Result Value Ref Range   WBC 7.2 4.0 - 10.5 K/uL   RBC 2.42 (L) 3.87 - 5.11 MIL/uL   Hemoglobin 5.9 (LL) 12.0 - 15.0 g/dL    Comment: This critical result has verified and been called to [facility] by Annabella on 05 23 2021 at 1919, and has been read back.    HCT 19.8 (L) 36.0 - 46.0 %   MCV 81.8 80.0 - 100.0 fL   MCH 24.4 (L) 26.0 - 34.0 pg   MCHC 29.8 (L) 30.0 - 36.0 g/dL   RDW 20.6 (H) 11.5 - 15.5 %   Platelets 314 150 - 400 K/uL   nRBC 0.6 (H) 0.0 - 0.2 %   Neutrophils Relative % 53 %   Neutro Abs 3.8 1.7 - 7.7 K/uL   Lymphocytes Relative 40 %   Lymphs Abs 2.9 0.7 - 4.0 K/uL   Monocytes Relative 7 %   Monocytes Absolute 0.5 0.1 - 1.0 K/uL   Eosinophils Relative 0 %   Eosinophils Absolute 0.0 0.0 - 0.5 K/uL   Basophils Relative 0 %   Basophils Absolute 0.0 0.0 - 0.1 K/uL   Immature Granulocytes 0 %   Abs Immature Granulocytes 0.01 0.00 - 0.07 K/uL    Comment: Performed at [facility], [place] [address], [place], [place] [address]  Comprehensive metabolic panel     Status: Abnormal   Collection Time: 04/08/20  6:28 PM  Result Value Ref Range   Sodium 135 135 - 145 mmol/L   Potassium 3.4 (L) 3.5 - 5.1 mmol/L   Chloride 95 (L) 98 - 111 mmol/L   CO2 29 22 - 32 mmol/L   Glucose,  Bld 101 (H) 70 - 99 mg/dL    Comment: Glucose reference range applies only to samples taken after fasting for at least 8 hours.   BUN 21 8 - 23 mg/dL   Creatinine, Ser 0.80 0.44 - 1.00 mg/dL   Calcium 8.8 (L) 8.9 - 10.3 mg/dL   Total Protein 7.3 6.5 - 8.1 g/dL   Albumin 3.5 3.5 - 5.0 g/dL   AST 23 15 -  41 U/L   ALT 14 0 - 44 U/L   Alkaline Phosphatase 102 38 - 126 U/L   Total Bilirubin 0.4 0.3 - 1.2 mg/dL   GFR calc non Af Amer >60 >60 mL/min   GFR calc Af Amer >60 >60 mL/min   Anion gap 11 5 - 15    Comment: Performed at [facility], [place] [address], [place], [place] [address]  Vitamin B12     Status: Abnormal   Collection Time: 04/08/20  8:13 PM  Result Value Ref Range   Vitamin B-12 177 (L) 180 - 914 pg/mL    Comment: (NOTE) This assay is not validated for testing neonatal or myeloproliferative syndrome specimens for Vitamin B12 levels. Performed at [facility], [place] [address] [place], [place] [address]   Folate     Status: None   Collection Time: 04/08/20  8:13 PM  Result Value Ref Range   Folate 9.2 >5.9 ng/mL    Comment: Performed at [facility], [place] [address] [place], [place] [address]  Iron and TIBC     Status: Abnormal   Collection Time: 04/08/20  8:13 PM  Result Value Ref Range   Iron 30 28 - 170 ug/dL   TIBC 492 (H) 250 - 450 ug/dL   Saturation Ratios 6 (L) 10.4 - 31.8 %   UIBC 462 ug/dL    Comment: Performed at [facility], [place] [address] [place], [place] [address]  Ferritin     Status: Abnormal   Collection Time: 04/08/20  8:13 PM  Result Value Ref Range   Ferritin 8 (L) 11 - 307 ng/mL    Comment: Performed at [facility], [place] [address], [place], [place] [address]  Reticulocytes     Status: Abnormal   Collection Time: 04/08/20  8:13 PM  Result Value Ref Range   Retic Ct Pct 4.0 (H) 0.4 - 3.1 %   RBC. 2.09 (L) 3.87 - 5.11 MIL/uL   Retic Count, Absolute  84.2 19.0 - 186.0 K/uL   Immature Retic Fract 42.6 (H) 2.3 - 15.9 %    Comment: Performed at [facility], [place] [address] [place], [place] [address]  Type and screen [place]     Status: None (Preliminary result)   Collection Time: 04/08/20  8:13 PM  Result Value Ref Range   ABO/RH(D) B POS    Antibody Screen NEG    Sample Expiration 04/11/2020,2359    Unit Number [id number]    Blood Component Type RBC LR PHER1    Unit division 00    Status of Unit ISSUED    Transfusion Status OK TO TRANSFUSE    Crossmatch Result Compatible    Unit Number [id number]    Blood Component Type RBC LR PHER2    Unit division 00    Status of Unit ISSUED    Transfusion Status OK TO TRANSFUSE    Crossmatch Result      Compatible Performed at [facility], [place] [address] [place] [address], [place], [place] [address]   Prepare RBC (crossmatch)     Status: None   Collection Time: 04/08/20  8:13 PM  Result Value Ref Range   Order Confirmation      ORDER PROCESSED BY BLOOD BANK Performed at [facility], [place] [address], [place],  [address]   Urinalysis, Routine w reflex microscopic     Status: None   Collection Time: 04/08/20  8:13 PM  Result Value Ref Range   Color, Urine YELLOW YELLOW   APPearance CLEAR CLEAR   Specific Gravity, Urine 1.012 1.005 - 1.030   pH 6.0 5.0 - 8.0   Glucose, UA NEGATIVE NEGATIVE mg/dL   Hgb urine dipstick NEGATIVE NEGATIVE   Bilirubin Urine NEGATIVE NEGATIVE   Ketones, ur NEGATIVE NEGATIVE mg/dL   Protein, ur NEGATIVE NEGATIVE mg/dL   Nitrite NEGATIVE NEGATIVE   Leukocytes,Ua NEGATIVE NEGATIVE    Comment: Performed at [place] [address] [place], [place] [address]  POC occult blood, ED     Status: Abnormal   Collection Time: 04/08/20  8:28 PM  Result Value Ref Range   Fecal Occult Bld POSITIVE (A) NEGATIVE  SARS Coronavirus 2 by RT PCR (hospital order, performed in  [facility] hospital lab) Nasopharyngeal Nasopharyngeal Swab     Status: None   Collection Time: 04/08/20  9:09 PM   Specimen: Nasopharyngeal Swab  Result Value Ref Range   SARS Coronavirus 2 NEGATIVE NEGATIVE    Comment: (NOTE) SARS-CoV-2 target nucleic acids are NOT DETECTED. The SARS-CoV-2 RNA is generally detectable in upper and lower respiratory specimens during the acute phase of infection. The lowest concentration of SARS-CoV-2 viral copies this assay can detect is 250 copies / mL. A negative result does not preclude SARS-CoV-2 infection and should not be used as the sole basis for treatment or other patient management decisions.  A negative result may occur with improper specimen collection / handling, submission of specimen other than nasopharyngeal swab, presence of viral mutation(s) within the areas targeted by this assay, and inadequate number of viral copies (<250 copies / mL). A negative result must be combined with clinical observations, patient history, and epidemiological information. Fact Sheet for Patients:   StrictlyIdeas.no Fact Sheet for Healthcare Providers: BankingDealers.co.za This test is not yet approved or cleared  by the [place] FDA and has been authorized for detection and/or diagnosis of SARS-CoV-2 by FDA under an Emergency Use Authorization (EUA).  This EUA will remain in effect (meaning this test can be used) for the duration of the COVID-19 declaration under Section 564(b)(1) of the Act, 21 U.S.C. section 360bbb-3(b)(1), unless the authorization is terminated or revoked sooner. Performed at [facility], [place] [address], [place], [place] [id number]   Basic metabolic panel     Status: Abnormal   Collection Time: 04/09/20  4:14 AM  Result Value Ref Range   Sodium 137 135 - 145 mmol/L   Potassium 3.5 3.5 - 5.1 mmol/L   Chloride 104 98 - 111 mmol/L   CO2 25 22 - 32 mmol/L    Glucose, Bld 85 70 - 99 mg/dL    Comment: Glucose reference range applies only to samples taken after fasting for at least 8 hours.   BUN 17 8 - 23 mg/dL   Creatinine, Ser 0.74 0.44 - 1.00 mg/dL   Calcium 8.1 (L) 8.9 - 10.3 mg/dL   GFR calc non Af Amer >60 >60 mL/min   GFR calc Af Amer >60 >60 mL/min   Anion gap 8 5 - 15    Comment: Performed at [facility], [place] [address], [place], [place] [address]  Magnesium     Status: None   Collection Time: 04/09/20  4:14 AM  Result Value Ref Range   Magnesium 2.0 1.7 - 2.4 mg/dL    Comment: Performed at [facility], [place] [address], [place], [place] [address]  Hemoglobin  and hematocrit, blood     Status: Abnormal   Collection Time: 04/09/20  4:14 AM  Result Value Ref Range   Hemoglobin 6.2 (LL) 12.0 - 15.0 g/dL    Comment: CRITICAL VALUE NOTED.  VALUE IS CONSISTENT WITH PREVIOUSLY REPORTED AND CALLED VALUE. REPEATED TO VERIFY THIS CRITICAL RESULT HAS VERIFIED AND BEEN CALLED TO KLRICH,T.,RN BY TURNER,SHAWANA ON 05 24 2021 AT 0502, AND HAS BEEN READ BACK.     HCT 20.7 (L) 36.0 - 46.0 %    Comment: Performed at [facility], [place] [address], [place], [place] [address]  Prepare RBC (crossmatch)     Status: None   Collection Time: 04/09/20  6:23 AM  Result Value Ref Range   Order Confirmation      ORDER PROCESSED BY BLOOD BANK Performed at [facility], [place] [address], [place], [place] [address]    CT Head Wo Contrast  Result Date: 04/08/2020 CLINICAL DATA:  Headache, dizziness, nausea for 3 days EXAM: CT HEAD WITHOUT CONTRAST TECHNIQUE: Contiguous axial images were obtained from the base of the skull through the vertex without intravenous contrast. COMPARISON:  02/14/2013 FINDINGS: Brain: No acute infarct or hemorrhage. Lateral ventricles and midline structures are unremarkable. There are chronic small vessel ischemic changes scattered throughout the periventricular  white matter. No acute extra-axial fluid collections. No mass effect. Vascular: No hyperdense vessel or unexpected calcification. Skull: Normal. Negative for fracture or focal lesion. Sinuses/Orbits: Chronic polypoid mucosal thickening within a posterior left ethmoid air cell. Remaining sinuses are clear. Other: None. IMPRESSION: 1. No acute intracranial process. Electronically Signed   By: Randa Ngo M.D.   On: 04/08/2020 18:59   Review of Systems  Constitutional: Negative.   HENT: Negative.   Eyes: Negative.   Gastrointestinal: Positive for anal bleeding and constipation. Negative for abdominal distention, abdominal pain, blood in stool, diarrhea, nausea, rectal pain and vomiting.  Endocrine: Negative.   Musculoskeletal: Positive for arthralgias.  Allergic/Immunologic: Negative.   Neurological: Negative.   Hematological: Negative.   Psychiatric/Behavioral: Negative.    Blood pressure 105/60, pulse 78, temperature 98.3 F (36.8 C), temperature source Oral, resp. rate 18, height 5\' 3"  (1.6 m), weight 60.8 kg, SpO2 100 %. Physical Exam  Constitutional: She is oriented to person, place, and time. She appears well-developed and well-nourished.  HENT:  Head: Normocephalic and atraumatic.  Eyes: Pupils are equal, round, and reactive to light. Conjunctivae and EOM are normal.  Cardiovascular: Normal rate and regular rhythm.  Respiratory: Effort normal and breath sounds normal.  GI: Soft. Bowel sounds are normal.  Musculoskeletal:        General: Normal range of motion.     Cervical back: Normal range of motion and neck supple.  Neurological: She is alert and oriented to person, place, and time.  Skin: Skin is warm and dry.  Psychiatric: She has a normal mood and affect. Her behavior is normal. Judgment and thought content normal.   Assessment/Plan: 1) Acute blood loss anemia-on DAPT-patient's hemoglobin is 7.7 g/dL today. We will plan to do an EGD tomorrow. We will allow her to receive  clear liquids to 4 hours prior to the procedure.  PPIs for now.  Patient is up-to-date on her colonoscopies.  Her last colonoscopy done in the summer 2020 revealed pandiverticulosis. 2) History of TIA/PAD-on aspirin and Plavix. These are currently being held 3) Hypertension/Hyperlipidemia. 4) History of bladder cancer currently under treatment at [facility]. 5) Hypokalemia-KCl added to IV fluids. Juanita Craver 04/09/2020,  9:38 AM

## 2020-04-09 NOTE — Progress Notes (Addendum)
PROGRESS NOTE    [name]  [id number] DOB: [birth year] DOA: 04/08/2020 PCP: Denita Lung, MD    Brief Narrative:  Patient admitted to the hospital with a working diagnosis of symptomatic anemia due to acute blood loss anemia due to suspected upper GI bleed.  74 year old female who presented with lightheadedness, fatigue, head pressure and melena.  He does have significant past medical history for hypertension, peripheral vascular disease status post revascularization, bladder cancer and history of TIA.  Patient reports persistent fatigue, lightheadedness, and headaches for several weeks.  Positive melena for several days.  On her initial physical examination blood pressure 131/72, heart rate 106, respirate 20, temperature 99, oxygen saturation 97%, lungs clear to auscultation, heart S1-S2, present rhythmic, abdomen soft nontender, no lower extremity edema. Sodium 135, potassium 3.4, chloride 95, bicarb 29, glucose 101, BUN 21, creatinine 0.8, white count 7.2, hemoglobin 5.9, hematocrit 19.8, platelets 314.  SARS COVID-19 negative.   Assessment & Plan:   Principal Problem:   Acute GI bleeding Active Problems:   Hypertension   PAD (peripheral artery disease) (HCC)   Symptomatic anemia   Bladder cancer (HCC)   1. Acute blood loss anemia due to upper GI bleed. Patient has received 2 units PRBC with good toleration, follow up Hgb is 7,7.   Will continue medical therapy with IV pantoprazole, IV fluids and as needed antiemetics. Pending GI evaluation, for now will advance diet to clears.   Follow H&H in am, or sooner if macroscopic bleeding. Continue to hold on aspirin and clopidogrel. Patient with high risk for recurrent bleeding.   2. Peripheral vascular disease/ dyslipidemia. Will continue to hold on asa and clopidogrel. Patient had a recent revascularization this year.   Continue with atorvastatin.   3. HTN. Continue blood pressure monitoring, for no hold on  antihypertensive medications. At home on HCTZ.   4. Bladder cancer. No signs of hematuria, will follow up with urology as outpatient. BCG treatment.   5. Iron deficiency. Serum iron at 30, TIBC 492, transferrin saturation 6 and ferritin of 8. Will plan for IV iron on this admission.   Status is: Inpatient  Remains inpatient appropriate because:IV treatments appropriate due to intensity of illness or inability to take PO   Dispo: The patient is from: Home              Anticipated d/c is to: Home              Anticipated d/c date is: 2 days              Patient currently is not medically stable to d/c.   DVT prophylaxis: scd   Code Status:   full  Family Communication:  No family at the bedside      Consultants:   GI     Subjective: Patient continue to feel very weak and deconditioned, no chest pain, no abdominal pain, no nausea or vomiting, no melena or hematochezia.   Objective: Vitals:   04/09/20 0641 04/09/20 0658 04/09/20 0948 04/09/20 1345  BP: 117/63 105/60 108/67 (!) 114/58  Pulse: 75 78 72 70  Resp: 17 18 15 16   Temp: 97.8 F (36.6 C) 98.3 F (36.8 C) 98.2 F (36.8 C) 97.9 F (36.6 C)  TempSrc: Oral Oral Oral Oral  SpO2: 99% 100% 98% 98%  Weight:      Height:        Intake/Output Summary (Last 24 hours) at 04/09/2020 1547 Last data filed at 04/09/2020 0935 Gross  per 24 hour  Intake 1835 ml  Output --  Net 1835 ml   Filed Weights   04/08/20 1616  Weight: 60.8 kg    Examination:   General: Not in pain or dyspnea,. Deconditioned and ill looking appearing  Neurology: Awake and alert, non focal  E ENT: positive pallor, no icterus, oral mucosa moist Cardiovascular: No JVD. S1-S2 present, rhythmic, no gallops, rubs, or murmurs. No lower extremity edema. Pulmonary: positive breath sounds bilaterally, no wheezing, rhonchi or rales. Gastrointestinal. Abdomen with no organomegaly, non tender, no rebound or guarding Skin. No rashes Musculoskeletal: no  joint deformities     Data Reviewed: I have personally reviewed following labs and imaging studies  CBC: Recent Labs  Lab 04/08/20 1828 04/09/20 0414 04/09/20 1122  WBC 7.2  --   --   NEUTROABS 3.8  --   --   HGB 5.9* 6.2* 7.7*  HCT 19.8* 20.7* 24.4*  MCV 81.8  --   --   PLT 314  --   --    Basic Metabolic Panel: Recent Labs  Lab 04/08/20 1828 04/09/20 0414  NA 135 137  K 3.4* 3.5  CL 95* 104  CO2 29 25  GLUCOSE 101* 85  BUN 21 17  CREATININE 0.80 0.74  CALCIUM 8.8* 8.1*  MG  --  2.0   GFR: Estimated Creatinine Clearance: 51.8 mL/min (by C-G formula based on SCr of 0.74 mg/dL). Liver Function Tests: Recent Labs  Lab 04/08/20 1828  AST 23  ALT 14  ALKPHOS 102  BILITOT 0.4  PROT 7.3  ALBUMIN 3.5   No results for input(s): LIPASE, AMYLASE in the last 168 hours. No results for input(s): AMMONIA in the last 168 hours. Coagulation Profile: No results for input(s): INR, PROTIME in the last 168 hours. Cardiac Enzymes: No results for input(s): CKTOTAL, CKMB, CKMBINDEX, TROPONINI in the last 168 hours. BNP (last 3 results) No results for input(s): PROBNP in the last 8760 hours. HbA1C: No results for input(s): HGBA1C in the last 72 hours. CBG: No results for input(s): GLUCAP in the last 168 hours. Lipid Profile: No results for input(s): CHOL, HDL, LDLCALC, TRIG, CHOLHDL, LDLDIRECT in the last 72 hours. Thyroid Function Tests: No results for input(s): TSH, T4TOTAL, FREET4, T3FREE, THYROIDAB in the last 72 hours. Anemia Panel: Recent Labs    04/08/20 2013  VITAMINB12 177*  FOLATE 9.2  FERRITIN 8*  TIBC 492*  IRON 30  RETICCTPCT 4.0*      Radiology Studies: I have reviewed all of the imaging during this hospital visit personally     Scheduled Meds: . sodium chloride   Intravenous Once  . sodium chloride flush  3 mL Intravenous Q12H   Continuous Infusions: . pantoprozole (PROTONIX) infusion 8 mg/hr (04/09/20 0349)     LOS: 1 day         Kainat Pizana Gerome Apley, MD

## 2020-04-09 NOTE — Progress Notes (Signed)
CRITICAL VALUE ALERT  Critical Value:  hmg 6.2  Date & Time Notied:  04/09/20, 0445  Provider Notified: [place]  Orders Received/Actions taken: waiting for orders    Gwendlyn Deutscher, RN

## 2020-04-09 NOTE — Progress Notes (Signed)
PROGRESS NOTE  [name] [id number] DOB: 07/04/1946 DOA: 04/08/2020 PCP: Denita Lung, MD   LOS: 1 day   Brief Narrative / Interim history: [name] is a 74 y.o. femail with a medical history significant for peripheral arterial disease s/p revascularization, breast cancer, bladder cancer currently undergoing treatment and a history of TIA, who presents to the ED with insidious onset over several weeks of lightheadedness, orthostatic hypotension, headache, fatigue and dark stools. Pt describes headache as "pressure", worsened by activity, without vision, hearing changes, numbness or weakness. Pt is lightheaded with posture changes, without LOC. Patient reports worsening of dark stool over several days. Pt on ASA and Plavix daily without PPI or H2 blocker. She denies abdominal pain, nausea, vomiting or hematemesis.   Subjective / 24h Interval events: Patient is resting in bed with husband at bedside on morning rounds. Pt is concerned about her NPO status and requesting breakfast. I informed patient and her husband of pending GI consult with possible endoscopy, as well as blood transfusions currently ongoing, both requiring current NPO status. Pt voiced understanding. Pt also is concerned about missing her bladder cancer treatment appointment tomorrow with [facility] due to continued inpatient status. Patient's husband stated he has contacted Dr. Carlton Adam nurse at [phone] ext [phone] to inform them of need to reschedule. I offered to help contact Dr. Louis Meckel if he is unable to do so.   Assessment & Plan: Principal Problem 1. Symptomatic anemia in the setting of possible chronic upper GI bleed - Patient symptomatic with progressive lightheadedness, orthostatic hypotension and fatigue over several weeks with positive FOBT. GI has been consulted and much appreciated. Antiplatelets held and PPI started in ED. Continue IV Protonix, hold antiplatelets, and repeat post-transfusion  H&H. Transfuse if hemoglobin less than 7. Diet per GI discretion, currently NPO.  Active Problems 2. Bladder cancer - Undergoing treatment with [facility]. Pt is due for third treatment on 5/25, tomorrow. Advised patient of likely continued inpatient stay through tomorrow, which will require re-scheduling treatment tomorrow.  3. PAD - s/p revascularization on DAPT without acute ischemia with ECG on 5/23 in ED. Antiplatelets held while addressing GI bleed. Pt on telemetry.   4. Hypertension - BP stable and at goal. Continue to monitor. Pt on telemetry.  5. Hypokalemia - Potassium 3.4 supplemented in ED. Currently WNL on repeat BMP this am. Continue to monitor.  Scheduled Meds: . sodium chloride   Intravenous Once  . sodium chloride flush  3 mL Intravenous Q12H   Continuous Infusions: . 0.9 % NaCl with KCl 20 mEq / L 100 mL/hr at 04/09/20 0030  . pantoprozole (PROTONIX) infusion 8 mg/hr (04/09/20 0349)   PRN Meds:.acetaminophen **OR** acetaminophen, ondansetron **OR** ondansetron (ZOFRAN) IV  DVT prophylaxis: None Code Status: Full code Family Communication: Husband present at the bedside.  Status is: Inpatient  Remains inpatient appropriate because:Inpatient level of care appropriate due to severity of illness   Dispo: The patient is from: Home              Anticipated d/c is to: Home              Anticipated d/c date is: 2 days              Patient currently is not medically stable to d/c.   Consultants:  GI consulted by ED physician.   Procedures:  2D echo: None Foley: None BiPAP: None HD: None  Microbiology  Covid negative  Antimicrobials: None   Objective:  Vitals:   04/09/20 0134 04/09/20 0347 04/09/20 0641 04/09/20 0658  BP: (!) 104/57 (!) 112/58 117/63 105/60  Pulse: 85 74 75 78  Resp: 18 16 17 18   Temp: 98 F (36.7 C) 97.9 F (36.6 C) 97.8 F (36.6 C) 98.3 F (36.8 C)  TempSrc: Oral Oral Oral Oral  SpO2: 99% 98% 99% 100%  Weight:       Height:        Intake/Output Summary (Last 24 hours) at 04/09/2020 0927 Last data filed at 04/09/2020 0347 Gross per 24 hour  Intake 1495 ml  Output -  Net 1495 ml   Filed Weights   04/08/20 1616  Weight: 60.8 kg    Examination:  Constitutional: NAD Eyes: no scleral icterus. Grossly decreased visual acuity.  ENMT: Mucous membranes are moist.  Neck: normal, supple Respiratory: clear to auscultation bilaterally, no wheezing, no crackles. Normal respiratory effort. No accessory muscle use.  Cardiovascular: Regular rate and rhythm, no murmurs / rubs / gallops. No LE edema. Good peripheral pulses Abdomen: non distended, no tenderness. Bowel sounds positive.  Musculoskeletal: no clubbing / cyanosis.  Skin: no rashes Neurologic: grossly nonfocal  Psychiatric: Normal judgment and insight. Alert and oriented x 3. Normal mood.    Data Reviewed: I have independently reviewed following labs and imaging studies   CBC: Recent Labs  Lab 04/08/20 1828 04/09/20 0414  WBC 7.2  --   NEUTROABS 3.8  --   HGB 5.9* 6.2*  HCT 19.8* 20.7*  MCV 81.8  --   PLT 314  --    Basic Metabolic Panel: Recent Labs  Lab 04/08/20 1828 04/09/20 0414  NA 135 137  K 3.4* 3.5  CL 95* 104  CO2 29 25  GLUCOSE 101* 85  BUN 21 17  CREATININE 0.80 0.74  CALCIUM 8.8* 8.1*  MG  --  2.0   Liver Function Tests: Recent Labs  Lab 04/08/20 1828  AST 23  ALT 14  ALKPHOS 102  BILITOT 0.4  PROT 7.3  ALBUMIN 3.5   Coagulation Profile: No results for input(s): INR, PROTIME in the last 168 hours. HbA1C: No results for input(s): HGBA1C in the last 72 hours. CBG: No results for input(s): GLUCAP in the last 168 hours.  Recent Results (from the past 240 hour(s))  SARS Coronavirus 2 by RT PCR (hospital order, performed in [facility] hospital lab) Nasopharyngeal Nasopharyngeal Swab     Status: None   Collection Time: 04/08/20  9:09 PM   Specimen: Nasopharyngeal Swab  Result Value Ref Range Status    SARS Coronavirus 2 NEGATIVE NEGATIVE Final    Comment: (NOTE) SARS-CoV-2 target nucleic acids are NOT DETECTED. The SARS-CoV-2 RNA is generally detectable in upper and lower respiratory specimens during the acute phase of infection. The lowest concentration of SARS-CoV-2 viral copies this assay can detect is 250 copies / mL. A negative result does not preclude SARS-CoV-2 infection and should not be used as the sole basis for treatment or other patient management decisions.  A negative result may occur with improper specimen collection / handling, submission of specimen other than nasopharyngeal swab, presence of viral mutation(s) within the areas targeted by this assay, and inadequate number of viral copies (<250 copies / mL). A negative result must be combined with clinical observations, patient history, and epidemiological information. Fact Sheet for Patients:   StrictlyIdeas.no Fact Sheet for Healthcare Providers: BankingDealers.co.za This test is not yet approved or cleared  by the [place] FDA and has been authorized  for detection and/or diagnosis of SARS-CoV-2 by FDA under an Emergency Use Authorization (EUA).  This EUA will remain in effect (meaning this test can be used) for the duration of the COVID-19 declaration under Section 564(b)(1) of the Act, 21 U.S.C. section 360bbb-3(b)(1), unless the authorization is terminated or revoked sooner. Performed at [facility], [place] [address] [place], [place] [address]      Radiology Studies: CT Head Wo Contrast  Result Date: 04/08/2020 CLINICAL DATA:  Headache, dizziness, nausea for 3 days EXAM: CT HEAD WITHOUT CONTRAST TECHNIQUE: Contiguous axial images were obtained from the base of the skull through the vertex without intravenous contrast. COMPARISON:  02/14/2013 FINDINGS: Brain: No acute infarct or hemorrhage. Lateral ventricles and midline structures are  unremarkable. There are chronic small vessel ischemic changes scattered throughout the periventricular white matter. No acute extra-axial fluid collections. No mass effect. Vascular: No hyperdense vessel or unexpected calcification. Skull: Normal. Negative for fracture or focal lesion. Sinuses/Orbits: Chronic polypoid mucosal thickening within a posterior left ethmoid air cell. Remaining sinuses are clear. Other: None. IMPRESSION: 1. No acute intracranial process. Electronically Signed   By: Randa Ngo M.D.   On: 04/08/2020 18:59

## 2020-04-09 NOTE — TOC Progression Note (Signed)
Transition of Care [facility]) - Progression Note    Patient Details  Name: [name] MRN: [id number] Date of Birth: 03-06-1946  Transition of Care [facility]) CM/SW Contact  Purcell Mouton, RN Phone Number: 04/09/2020, 3:22 PM  Clinical Narrative:    TOC will continue to follow pt for discharge needs. At present time there are no HH needs.    Expected Discharge Plan: Home/Self Care Barriers to Discharge: No Barriers Identified  Expected Discharge Plan and Services Expected Discharge Plan: Home/Self Care   Discharge Planning Services: CM Consult   Living arrangements for the past 2 months: Single Family Home                                       Social Determinants of Health (SDOH) Interventions    Readmission Risk Interventions No flowsheet data found.

## 2020-04-09 NOTE — Plan of Care (Signed)

## 2020-04-09 NOTE — Consult Note (Signed)
Reason for Consult: Severe anemia with guaiac positive stools. Referring Physician: [facility].  [name] is an 74 y.o. female.  HPI: [name] is a 74 year old black female with multiple medical problems listed below who presented to [facility] emergency room yesterday with a 1 week history of weakness and lightheadedness along with melenic stools.  She gives a history of similar problems over a month ago when she had dark stools for about 3 weeks.  She denies having any syncope or near syncopal events.  In addition to the weakness and fatigue she has had a headache but denies any loss of consciousness or visual problems.  She is on aspirin and Plavix for peripheral artery disease/TIA.  She denies having any abdominal pain, nausea, vomiting, coffee-ground emesis/hematemesis or bright red bleeding per rectum.  In the emergency room she was found to have a hemoglobin of 5.9 g/dL which drifted down from 9.8 g/dL in March of this year and she was FOBT positive. GI consultation was requested. Patient is well-known to me for several years now. She had a colonoscopy on 05/23/2019 when she was noted to have pandiverticulosis and a tubular adenoma was removed. She had a tubular adenoma removed her previous colonoscopy done in 2012. She never had a EGD. A CT scan of the head done in the emergency room no acute abnormalities.  She was also transfused 1 unit of packed red blood cells. She is also being treated for bladder cancer at [facility]. Patient denies use of OTC nonsteroidals. She uses Tylenol occasionally as needed. There is no previous history of ulcer disease.   Past Medical History:  Diagnosis Date  . Abdominal wall hernia   . Blood transfusion    30+ yrs. ago  . Cancer (HCC)    Right Breast  . Hyperlipidemia   . Hypertension   . Peripheral vascular disease ([place])   . Seasonal allergies   . TIA (transient ischemic attack)    Past Surgical History:  Procedure  Laterality Date  . ABDOMINAL AORTOGRAM W/LOWER EXTREMITY N/A 01/10/2020   Procedure: ABDOMINAL AORTOGRAM W/LOWER EXTREMITY;  Surgeon: Serafina Mitchell, MD;  Location: [place] [facility];  Service: Cardiovascular;  Laterality: N/A;  Bilateral   . ABDOMINAL HYSTERECTOMY    . Aortobifemoral BPG  05/15/11   Using a 14 x8 bifurcated Dacron Graft  . BREAST SURGERY     right lumpectomy  . BREAST SURGERY     multiple breast surgeries  . CATARACT EXTRACTION Right 10/2018  . COLONOSCOPY  2012   Caden Fukushima  . CYSTOSCOPY W/ RETROGRADES Bilateral 05/27/2019   Procedure: CYSTOSCOPY WITH BILATERAL RETROGRADE PYELOGRAM;  Surgeon: [name], MD;  Location: [facility];  Service: Urology;  Laterality: Bilateral;  . CYSTOSCOPY WITH BIOPSY N/A 05/27/2019   Procedure: CYSTOSCOPY WITH BLADDER  BIOPSY;  Surgeon: [name], MD;  Location: [facility];  Service: Urology;  Laterality: N/A;  . DILATION AND CURETTAGE OF UTERUS     3-4 in late 20"s  . ENDARTERECTOMY FEMORAL Left 01/27/2020   REDO OF LEFT ENDARTERECTOMY FEMORAL (Left )  . ENDARTERECTOMY FEMORAL Left 01/27/2020   Procedure: REDO OF LEFT ENDARTERECTOMY FEMORAL;  Surgeon: Serafina Mitchell, MD;  Location: [place];  Service: Vascular;  Laterality: Left;  . HERNIA REPAIR    . NM MYOCAR PERF WALL MOTION  03/11/2011   mild to moderate perfusion defect in the apical and apical lateral region c/w an infarct/scar  . PATCH ANGIOPLASTY Left 01/27/2020  Procedure: [place];  Surgeon: Serafina Mitchell, MD;  Location: [place];  Service: Vascular;  Laterality: Left;  [name] VASCULAR SURGERY  05/15/11   aobifemoral bypass graft  . VENTRAL HERNIA REPAIR  12/26/2011   Procedure: LAPAROSCOPIC VENTRAL HERNIA;  Surgeon: Adin Hector, MD;  Location: [facility];  Service: General;  Laterality: N/A;  With Mesh   Family History  Problem Relation Age of Onset  . Heart attack Sister   . Heart attack Brother   . Stroke Sister   .  Cancer Mother        BREAST, BRAIN  . Heart disease Mother   . Cancer Paternal Aunt        breast ca  . Cancer Paternal Grandmother        unknown ca   Social History:  reports that she has been smoking cigarettes. She has a 15.00 pack-year smoking history. She has never used smokeless tobacco. She reports that she does not drink alcohol or use drugs.  Allergies:  Allergies  Allergen Reactions  . Latex Rash   Medications: I have reviewed the patient's current medications.  Results for orders placed or performed during the hospital encounter of 04/08/20 (from the past 48 hour(s))  CBC with Differential/Platelet     Status: Abnormal   Collection Time: 04/08/20  6:28 PM  Result Value Ref Range   WBC 7.2 4.0 - 10.5 K/uL   RBC 2.42 (L) 3.87 - 5.11 MIL/uL   Hemoglobin 5.9 (LL) 12.0 - 15.0 g/dL    Comment: This critical result has verified and been called to [facility] by [facility] on 05 23 2021 at 1919, and has been read back.    HCT 19.8 (L) 36.0 - 46.0 %   MCV 81.8 80.0 - 100.0 fL   MCH 24.4 (L) 26.0 - 34.0 pg   MCHC 29.8 (L) 30.0 - 36.0 g/dL   RDW 20.6 (H) 11.5 - 15.5 %   Platelets 314 150 - 400 K/uL   nRBC 0.6 (H) 0.0 - 0.2 %   Neutrophils Relative % 53 %   Neutro Abs 3.8 1.7 - 7.7 K/uL   Lymphocytes Relative 40 %   Lymphs Abs 2.9 0.7 - 4.0 K/uL   Monocytes Relative 7 %   Monocytes Absolute 0.5 0.1 - 1.0 K/uL   Eosinophils Relative 0 %   Eosinophils Absolute 0.0 0.0 - 0.5 K/uL   Basophils Relative 0 %   Basophils Absolute 0.0 0.0 - 0.1 K/uL   Immature Granulocytes 0 %   Abs Immature Granulocytes 0.01 0.00 - 0.07 K/uL    Comment: Performed at [facility], [place] [address] [place], [place] [address]  Comprehensive metabolic panel     Status: Abnormal   Collection Time: 04/08/20  6:28 PM  Result Value Ref Range   Sodium 135 135 - 145 mmol/L   Potassium 3.4 (L) 3.5 - 5.1 mmol/L   Chloride 95 (L) 98 - 111 mmol/L   CO2 29 22 - 32 mmol/L   Glucose,  Bld 101 (H) 70 - 99 mg/dL    Comment: Glucose reference range applies only to samples taken after fasting for at least 8 hours.   BUN 21 8 - 23 mg/dL   Creatinine, Ser 0.80 0.44 - 1.00 mg/dL   Calcium 8.8 (L) 8.9 - 10.3 mg/dL   Total Protein 7.3 6.5 - 8.1 g/dL   Albumin 3.5 3.5 - 5.0 g/dL   AST 23 15 -  41 U/L   ALT 14 0 - 44 U/L   Alkaline Phosphatase 102 38 - 126 U/L   Total Bilirubin 0.4 0.3 - 1.2 mg/dL   GFR calc non Af Amer >60 >60 mL/min   GFR calc Af Amer >60 >60 mL/min   Anion gap 11 5 - 15    Comment: Performed at [facility], [place] [address] [place], [place] [address]  Vitamin B12     Status: Abnormal   Collection Time: 04/08/20  8:13 PM  Result Value Ref Range   Vitamin B-12 177 (L) 180 - 914 pg/mL    Comment: (NOTE) This assay is not validated for testing neonatal or myeloproliferative syndrome specimens for Vitamin B12 levels. Performed at [facility], [place] [address], [place], [place] [address]   Folate     Status: None   Collection Time: 04/08/20  8:13 PM  Result Value Ref Range   Folate 9.2 >5.9 ng/mL    Comment: Performed at [facility], [place] [address], [place], [place] [address]  Iron and TIBC     Status: Abnormal   Collection Time: 04/08/20  8:13 PM  Result Value Ref Range   Iron 30 28 - 170 ug/dL   TIBC 492 (H) 250 - 450 ug/dL   Saturation Ratios 6 (L) 10.4 - 31.8 %   UIBC 462 ug/dL    Comment: Performed at [facility], [place] [address], [place], [place] [address]  Ferritin     Status: Abnormal   Collection Time: 04/08/20  8:13 PM  Result Value Ref Range   Ferritin 8 (L) 11 - 307 ng/mL    Comment: Performed at [facility], [place] [address], [place], [place] [address]  Reticulocytes     Status: Abnormal   Collection Time: 04/08/20  8:13 PM  Result Value Ref Range   Retic Ct Pct 4.0 (H) 0.4 - 3.1 %   RBC. 2.09 (L) 3.87 - 5.11 MIL/uL   Retic Count, Absolute  84.2 19.0 - 186.0 K/uL   Immature Retic Fract 42.6 (H) 2.3 - 15.9 %    Comment: Performed at [facility], [place] [address] [place], [place] [address]  Type and screen [place]     Status: None (Preliminary result)   Collection Time: 04/08/20  8:13 PM  Result Value Ref Range   ABO/RH(D) B POS    Antibody Screen NEG    Sample Expiration 04/11/2020,2359    Unit Number [id number]    Blood Component Type RBC LR PHER1    Unit division 00    Status of Unit ISSUED    Transfusion Status OK TO TRANSFUSE    Crossmatch Result Compatible    Unit Number [id number]    Blood Component Type RBC LR PHER2    Unit division 00    Status of Unit ISSUED    Transfusion Status OK TO TRANSFUSE    Crossmatch Result      Compatible Performed at [facility], [place] [address] [place], [place] [address]   Prepare RBC (crossmatch)     Status: None   Collection Time: 04/08/20  8:13 PM  Result Value Ref Range   Order Confirmation      ORDER PROCESSED BY BLOOD BANK Performed at [facility], [place] [address], [place], [place] [address]   Urinalysis, Routine w reflex microscopic     Status: None   Collection Time: 04/08/20  8:13 PM  Result Value Ref Range   Color, Urine YELLOW YELLOW   APPearance CLEAR CLEAR   Specific Gravity, Urine 1.012 1.005 - 1.030   pH 6.0 5.0 - 8.0   Glucose, UA NEGATIVE NEGATIVE mg/dL   Hgb urine dipstick NEGATIVE NEGATIVE   Bilirubin Urine NEGATIVE NEGATIVE   Ketones, ur NEGATIVE NEGATIVE mg/dL   Protein, ur NEGATIVE NEGATIVE mg/dL   Nitrite NEGATIVE NEGATIVE   Leukocytes,Ua NEGATIVE NEGATIVE    Comment: Performed at [place] [address], [place], [place] [address]  POC occult blood, ED     Status: Abnormal   Collection Time: 04/08/20  8:28 PM  Result Value Ref Range   Fecal Occult Bld POSITIVE (A) NEGATIVE  SARS Coronavirus 2 by RT PCR (hospital order, performed in  [facility] hospital lab) Nasopharyngeal Nasopharyngeal Swab     Status: None   Collection Time: 04/08/20  9:09 PM   Specimen: Nasopharyngeal Swab  Result Value Ref Range   SARS Coronavirus 2 NEGATIVE NEGATIVE    Comment: (NOTE) SARS-CoV-2 target nucleic acids are NOT DETECTED. The SARS-CoV-2 RNA is generally detectable in upper and lower respiratory specimens during the acute phase of infection. The lowest concentration of SARS-CoV-2 viral copies this assay can detect is 250 copies / mL. A negative result does not preclude SARS-CoV-2 infection and should not be used as the sole basis for treatment or other patient management decisions.  A negative result may occur with improper specimen collection / handling, submission of specimen other than nasopharyngeal swab, presence of viral mutation(s) within the areas targeted by this assay, and inadequate number of viral copies (<250 copies / mL). A negative result must be combined with clinical observations, patient history, and epidemiological information. Fact Sheet for Patients:   StrictlyIdeas.no Fact Sheet for Healthcare Providers: BankingDealers.co.za This test is not yet approved or cleared  by the [place] FDA and has been authorized for detection and/or diagnosis of SARS-CoV-2 by FDA under an Emergency Use Authorization (EUA).  This EUA will remain in effect (meaning this test can be used) for the duration of the COVID-19 declaration under Section 564(b)(1) of the Act, 21 U.S.C. section 360bbb-3(b)(1), unless the authorization is terminated or revoked sooner. Performed at [facility], [place] [address], [place], [place] [id number]   Basic metabolic panel     Status: Abnormal   Collection Time: 04/09/20  4:14 AM  Result Value Ref Range   Sodium 137 135 - 145 mmol/L   Potassium 3.5 3.5 - 5.1 mmol/L   Chloride 104 98 - 111 mmol/L   CO2 25 22 - 32 mmol/L    Glucose, Bld 85 70 - 99 mg/dL    Comment: Glucose reference range applies only to samples taken after fasting for at least 8 hours.   BUN 17 8 - 23 mg/dL   Creatinine, Ser 0.74 0.44 - 1.00 mg/dL   Calcium 8.1 (L) 8.9 - 10.3 mg/dL   GFR calc non Af Amer >60 >60 mL/min   GFR calc Af Amer >60 >60 mL/min   Anion gap 8 5 - 15    Comment: Performed at [facility], [place] [address] [place], [place] [address]  Magnesium     Status: None   Collection Time: 04/09/20  4:14 AM  Result Value Ref Range   Magnesium 2.0 1.7 - 2.4 mg/dL    Comment: Performed at [facility], [place] [address] [place], [place] [address]  Hemoglobin  and hematocrit, blood     Status: Abnormal   Collection Time: 04/09/20  4:14 AM  Result Value Ref Range   Hemoglobin 6.2 (LL) 12.0 - 15.0 g/dL    Comment: CRITICAL VALUE NOTED.  VALUE IS CONSISTENT WITH PREVIOUSLY REPORTED AND CALLED VALUE. REPEATED TO VERIFY THIS CRITICAL RESULT HAS VERIFIED AND BEEN CALLED TO KLRICH,T.,RN BY TURNER,SHAWANA ON 05 24 2021 AT 0502, AND HAS BEEN READ BACK.     HCT 20.7 (L) 36.0 - 46.0 %    Comment: Performed at [facility], [place] [address] [place], [place] [address]  Prepare RBC (crossmatch)     Status: None   Collection Time: 04/09/20  6:23 AM  Result Value Ref Range   Order Confirmation      ORDER PROCESSED BY BLOOD BANK Performed at [facility], [place] [address], [place], [place] [address]    CT Head Wo Contrast  Result Date: 04/08/2020 CLINICAL DATA:  Headache, dizziness, nausea for 3 days EXAM: CT HEAD WITHOUT CONTRAST TECHNIQUE: Contiguous axial images were obtained from the base of the skull through the vertex without intravenous contrast. COMPARISON:  02/14/2013 FINDINGS: Brain: No acute infarct or hemorrhage. Lateral ventricles and midline structures are unremarkable. There are chronic small vessel ischemic changes scattered throughout the periventricular  white matter. No acute extra-axial fluid collections. No mass effect. Vascular: No hyperdense vessel or unexpected calcification. Skull: Normal. Negative for fracture or focal lesion. Sinuses/Orbits: Chronic polypoid mucosal thickening within a posterior left ethmoid air cell. Remaining sinuses are clear. Other: None. IMPRESSION: 1. No acute intracranial process. Electronically Signed   By: Randa Ngo M.D.   On: 04/08/2020 18:59   Review of Systems  Constitutional: Negative.   HENT: Negative.   Eyes: Negative.   Gastrointestinal: Positive for anal bleeding and constipation. Negative for abdominal distention, abdominal pain, blood in stool, diarrhea, nausea, rectal pain and vomiting.  Endocrine: Negative.   Musculoskeletal: Positive for arthralgias.  Allergic/Immunologic: Negative.   Neurological: Negative.   Hematological: Negative.   Psychiatric/Behavioral: Negative.    Blood pressure 105/60, pulse 78, temperature 98.3 F (36.8 C), temperature source Oral, resp. rate 18, height 5\' 3"  (1.6 m), weight 60.8 kg, SpO2 100 %. Physical Exam  Constitutional: She is oriented to person, place, and time. She appears well-developed and well-nourished.  HENT:  Head: Normocephalic and atraumatic.  Eyes: Pupils are equal, round, and reactive to light. Conjunctivae and EOM are normal.  Cardiovascular: Normal rate and regular rhythm.  Respiratory: Effort normal and breath sounds normal.  GI: Soft. Bowel sounds are normal.  Musculoskeletal:        General: Normal range of motion.     Cervical back: Normal range of motion and neck supple.  Neurological: She is alert and oriented to person, place, and time.  Skin: Skin is warm and dry.  Psychiatric: She has a normal mood and affect. Her behavior is normal. Judgment and thought content normal.   Assessment/Plan: 1) Acute blood loss anemia-on DAPT-patient's hemoglobin is 7.7 g/dL today. We will plan to do an EGD tomorrow. We will allow her to receive  clear liquids to 4 hours prior to the procedure.  PPIs for now.  Patient is up-to-date on her colonoscopies.  Her last colonoscopy done in the summer 2020 revealed pandiverticulosis. 2) History of TIA/PAD-on aspirin and Plavix. These are currently being held 3) Hypertension/Hyperlipidemia. 4) History of bladder cancer currently under treatment at [facility]. 5) Hypokalemia-KCl added to IV fluids. Juanita Craver 04/09/2020,  9:38 AM

## 2020-04-10 ENCOUNTER — Encounter (HOSPITAL_COMMUNITY)
Admission: EM | Disposition: A | Discharge status: Home / Self Care | Payer: Self-pay | Source: Home / Self Care | Attending: Internal Medicine

## 2020-04-10 HISTORY — PX: HEMOSTASIS CLIP PLACEMENT: SHX6857

## 2020-04-10 HISTORY — PX: ENTEROSCOPY: SHX5533

## 2020-04-10 HISTORY — PX: SUBMUCOSAL TATTOO INJECTION: SHX6856

## 2020-04-10 HISTORY — PX: HOT HEMOSTASIS: SHX5433

## 2020-04-10 LAB — CBC WITH DIFFERENTIAL/PLATELET
Abs Immature Granulocytes: 0.02 10*3/uL (ref 0.00–0.07)
Basophils Absolute: 0 10*3/uL (ref 0.0–0.1)
Basophils Relative: 0 %
Eosinophils Absolute: 0.1 10*3/uL (ref 0.0–0.5)
Eosinophils Relative: 2 %
HCT: 23.6 % — ABNORMAL LOW (ref 36.0–46.0)
Hemoglobin: 7.5 g/dL — ABNORMAL LOW (ref 12.0–15.0)
Immature Granulocytes: 0 %
Lymphocytes Relative: 29 %
Lymphs Abs: 2 10*3/uL (ref 0.7–4.0)
MCH: 26.4 pg (ref 26.0–34.0)
MCHC: 31.8 g/dL (ref 30.0–36.0)
MCV: 83.1 fL (ref 80.0–100.0)
Monocytes Absolute: 0.5 10*3/uL (ref 0.1–1.0)
Monocytes Relative: 7 %
Neutro Abs: 4.2 10*3/uL (ref 1.7–7.7)
Neutrophils Relative %: 62 %
Platelets: 257 10*3/uL (ref 150–400)
RBC: 2.84 MIL/uL — ABNORMAL LOW (ref 3.87–5.11)
RDW: 18.1 % — ABNORMAL HIGH (ref 11.5–15.5)
WBC: 6.8 10*3/uL (ref 4.0–10.5)
nRBC: 0 % (ref 0.0–0.2)

## 2020-04-10 LAB — TYPE AND SCREEN
ABO/RH(D): B POS
Antibody Screen: NEGATIVE
Unit division: 0
Unit division: 0

## 2020-04-10 LAB — BASIC METABOLIC PANEL
Anion gap: 6 (ref 5–15)
BUN: 12 mg/dL (ref 8–23)
CO2: 26 mmol/L (ref 22–32)
Calcium: 8.7 mg/dL — ABNORMAL LOW (ref 8.9–10.3)
Chloride: 106 mmol/L (ref 98–111)
Creatinine, Ser: 0.7 mg/dL (ref 0.44–1.00)
GFR calc Af Amer: >60 mL/min (ref >60)
GFR calc non Af Amer: >60 mL/min (ref >60)
Glucose, Bld: 85 mg/dL (ref 70–99)
Potassium: 3.3 mmol/L — ABNORMAL LOW (ref 3.5–5.1)
Sodium: 138 mmol/L (ref 135–145)

## 2020-04-10 LAB — BPAM RBC
Blood Product Expiration Date: 202106112359
Blood Product Expiration Date: 202106112359
ISSUE DATE / TIME: 202105240104
ISSUE DATE / TIME: 202105240635
Unit Type and Rh: 7300
Unit Type and Rh: 7300

## 2020-04-10 SURGERY — ENTEROSCOPY
Anesthesia: Moderate Sedation

## 2020-04-10 MED ORDER — SODIUM CHLORIDE 0.9 % IV SOLN
510.0000 mg | Freq: Once | INTRAVENOUS | Status: AC
  Administered 2020-04-10: 510 mg via INTRAVENOUS
  Filled 2020-04-10: qty 510

## 2020-04-10 MED ORDER — MIDAZOLAM HCL (PF) 5 MG/ML IJ SOLN
INTRAMUSCULAR | Status: AC
  Filled 2020-04-10: qty 2

## 2020-04-10 MED ORDER — PHENOL 1.4 % MT LIQD: 1.0000 | OROMUCOSAL | Status: DC | PRN

## 2020-04-10 MED ORDER — SPOT INK MARKER SYRINGE KIT
PACK | SUBMUCOSAL | Status: AC
  Filled 2020-04-10: qty 10

## 2020-04-10 MED ORDER — FENTANYL CITRATE (PF) 100 MCG/2ML IJ SOLN
INTRAMUSCULAR | Status: DC | PRN
  Administered 2020-04-10 (×4): 25 ug via INTRAVENOUS

## 2020-04-10 MED ORDER — LACTATED RINGERS IV SOLN: INTRAVENOUS | Status: DC

## 2020-04-10 MED ORDER — GLUCAGON HCL (RDNA) 1 MG IJ SOLR
INTRAMUSCULAR | Status: DC | PRN
  Administered 2020-04-10 (×2): .5 mg via INTRAVENOUS

## 2020-04-10 MED ORDER — SODIUM CHLORIDE 0.9 % IV SOLN: INTRAVENOUS | Status: DC

## 2020-04-10 MED ORDER — POTASSIUM CHLORIDE CRYS ER 20 MEQ PO TBCR
40.0000 meq | EXTENDED_RELEASE_TABLET | ORAL | Status: AC
  Administered 2020-04-10 (×2): 40 meq via ORAL
  Filled 2020-04-10 (×3): qty 2

## 2020-04-10 MED ORDER — SPOT INK MARKER SYRINGE KIT
PACK | SUBMUCOSAL | Status: DC | PRN
  Administered 2020-04-10: 1 mL via SUBMUCOSAL

## 2020-04-10 MED ORDER — FENTANYL CITRATE (PF) 100 MCG/2ML IJ SOLN
INTRAMUSCULAR | Status: AC
  Filled 2020-04-10: qty 4

## 2020-04-10 MED ORDER — MIDAZOLAM HCL (PF) 10 MG/2ML IJ SOLN
INTRAMUSCULAR | Status: DC | PRN
  Administered 2020-04-10 (×2): 2 mg via INTRAVENOUS
  Administered 2020-04-10: 1 mg via INTRAVENOUS
  Administered 2020-04-10 (×2): 2 mg via INTRAVENOUS

## 2020-04-10 NOTE — Interval H&P Note (Signed)
History and Physical Interval Note:  04/10/2020 3:13 PM  [name]  has presented today for surgery, with the diagnosis of Severe anemia with guaiac positive stools..  The various methods of treatment have been discussed with the patient and family. After consideration of risks, benefits and other options for treatment, the patient has consented to  Procedure(s): ENTEROSCOPY (Left) as a surgical intervention.  The patient's history has been reviewed, patient examined, no change in status, stable for surgery.  I have reviewed the patient's chart and labs.  Questions were answered to the patient's satisfaction.     [name]

## 2020-04-10 NOTE — Progress Notes (Signed)
Patient headed to Endo for procedure. Consent signed. No questions or concerns voiced at this time.

## 2020-04-10 NOTE — Op Note (Signed)
[facility] Patient Name: [name] Procedure Date: 04/10/2020 MRN: [id number] Attending MD: Carol Ada , MD Date of Birth: 1946-04-10 CSN: [id number] Age: 74 Admit Type: Inpatient Procedure:                Small bowel enteroscopy Indications:              Iron deficiency anemia, Melena Providers:                Carol Ada, MD, Josie Dixon, RN, Wynonia Sours, RN, Grace Isaac, RN, Doristine Johns, RN,                            Cletis Athens, Technician Referring MD:              Medicines:                Fentanyl 100 micrograms IV, Midazolam 9 mg IV Complications:            No immediate complications. Estimated Blood Loss:     Estimated blood loss: none. Procedure:                Pre-Anesthesia Assessment:                           - Prior to the procedure, a History and Physical                            was performed, and patient medications and                            allergies were reviewed. The patient's tolerance of                            previous anesthesia was also reviewed. The risks                            and benefits of the procedure and the sedation                            options and risks were discussed with the patient.                            All questions were answered, and informed consent                            was obtained. Prior Anticoagulants: The patient has                            taken Plavix (clopidogrel), last dose was 1 day                            prior to procedure. ASA Grade Assessment: III - A  patient with severe systemic disease. After                            reviewing the risks and benefits, the patient was                            deemed in satisfactory condition to undergo the                            procedure.                           - Sedation was administered by an endoscopy nurse.                            The sedation level  attained was moderate.                           After obtaining informed consent, the endoscope was                            passed under direct vision. Throughout the                            procedure, the patient's blood pressure, pulse, and                            oxygen saturations were monitored continuously. The                            [id number]) Olympus pediatric colonscope                            was introduced through the mouth and advanced to                            the proximal jejunum. The small bowel enteroscopy                            was performed with difficulty. The patient                            tolerated the procedure. Scope In: Scope Out: Findings:      The esophagus was normal.      The stomach was normal.      A single angiodysplastic lesion with bleeding was found in the fourth       portion of the duodenum. Coagulation for hemostasis using monopolar       probe was successful. Estimated blood loss: none. To prevent bleeding       post-intervention, three hemostatic clips were successfully placed (MR       unsafe). There was no bleeding at the end of the procedure. Area was       tattooed with an injection of 1 mL of Spot (carbon black).      There was no evidence of significant pathology  in the entire examined       portion of jejunum.      With moderate sedation the patient was resistant to swallowing the       pediatric colonoscope. An adult EGD scope was used and it was able to       pass the UES. There was no overt signs of bleeding in the proximal upper       GI tract. A retry with the pediatric colonoscope was performed and this       time it was able to pass through the UES. The colonoscope was passed       into the fourth portion of the duodenum versus the proximal jejunum. It       was in this region that an actively bleeding AVM was identified.       Incremental sedation was used to help maintain a reasonable sedation,        but this was a difficult task. The AVM was isolated and it was able to       be ablated with APC. Because of the need to perform extensive APC, three       hemoclips were deployed across the area. On the opposite wall 1 ml of       SPOT was used to mark the area in case rebleeding recurs. Impression:               - Normal esophagus.                           - Normal stomach.                           - A single bleeding angiodysplastic lesion in the                            duodenum. Treated with a monopolar probe. Clips (MR                            unsafe) were placed. Tattooed.                           - The examined portion of the jejunum was normal.                           - No specimens collected. Recommendation:           - Return patient to hospital ward for ongoing care.                           - Clear liquid diet.                           - Monitor HGB and transfuse as necessary. Procedure Code(s):        --- Professional ---                           [phone], Small intestinal endoscopy, enteroscopy                            beyond second portion  of duodenum, not including                            ileum; with control of bleeding (eg, injection,                            bipolar cautery, unipolar cautery, laser, heater                            probe, stapler, plasma coagulator)                           44799, Unlisted procedure, small intestine Diagnosis Code(s):        --- Professional ---                           K31.811, Angiodysplasia of stomach and duodenum                            with bleeding                           D50.9, Iron deficiency anemia, unspecified                           K92.1, Melena (includes Hematochezia) CPT copyright 2019 American Medical Association. All rights reserved. The codes documented in this report are preliminary and upon coder review may  be revised to meet current compliance requirements. Carol Ada, MD Carol Ada, MD 04/10/2020 4:30:05 PM This report has been signed electronically. Number of Addenda: 0

## 2020-04-10 NOTE — Progress Notes (Signed)
PROGRESS NOTE    [name]  [id number] DOB: 14-Nov-1946 DOA: 04/08/2020 PCP: Denita Lung, MD    Brief Narrative:  Patient admitted to the hospital with a working diagnosis of symptomatic anemia due to acute blood loss anemia due to suspected upper GI bleed.  74 year old female who presented with lightheadedness, fatigue, head pressure and melena.  He does have significant past medical history for hypertension, peripheral vascular disease status post revascularization, bladder cancer and history of TIA.  Patient reports persistent fatigue, lightheadedness, and headaches for several weeks.  Positive melena for several days.  On her initial physical examination blood pressure 131/72, heart rate 106, respirate 20, temperature 99, oxygen saturation 97%, lungs clear to auscultation, heart S1-S2, present rhythmic, abdomen soft nontender, no lower extremity edema. Sodium 135, potassium 3.4, chloride 95, bicarb 29, glucose 101, BUN 21, creatinine 0.8, white count 7.2, hemoglobin 5.9, hematocrit 19.8, platelets 314.  SARS COVID-19 negative.  Patient tolerated well 2 units PRBC transfusion, stable hemodynamics. She will have further work up today with upper endoscopy.    Assessment & Plan:   Principal Problem:   Acute GI bleeding Active Problems:   Hypertension   PAD (peripheral artery disease) (HCC)   Symptomatic anemia   Bladder cancer (HCC)    1. Acute blood loss anemia due to upper GI bleed.  Sp 2 units PRBC . Patient continue to have melena, her Hgb this am is 7,5 and Hct at 23,6.   Continue medical therapy with IV pantoprazole drip. She has been NPO past midnight for endoscopic procedure today. Continue to hold antiplatelet therapy.   Continue to monitor cell count.   2. Peripheral vascular disease/ dyslipidemia. Holding on asa and clopidogrel. Patient had a recent revascularization this year.   On atorvastatin.   3. HTN. Holding on HCTZ, to prevent hypotension.   Blood pressure today continue to be low normal at 101/70.   4. Bladder cancer. No signs of hematuria, will follow up with urology as outpatient. BCG treatment.   5. Iron deficiency. Serum iron at 30, TIBC 492, transferrin saturation 6 and ferritin of 8.  Will give patient on dose of IV iron and follow iron stores as outpatient.   6. Hypokalemia. Stable renal function with serum cr at 0,70 with serum bicarbonate at 26. K down to 3,3 and cl at 106.  Will add 40 meq Kcl po x2 and will follow on renal panel in am.   Status is: Inpatient  Remains inpatient appropriate because:IV treatments appropriate due to intensity of illness or inability to take PO   Dispo: The patient is from: Home              Anticipated d/c is to: Home              Anticipated d/c date is: 1 day              Patient currently is not medically stable to d/c.    DVT prophylaxis: scd   Code Status:   full  Family Communication:  I spoke with patient's husband at the bedside, we talked in detail about patient's condition, plan of care and prognosis and all questions were addressed.    Consultants:   GI      Subjective: Patient continue to have melena, no nausea or vomiting, no abdominal pain, she has been npo after midnight,   Objective: Vitals:   04/10/20 1535 04/10/20 1540 04/10/20 1545 04/10/20 1550  BP: (!) 86/62 (!) 93/49  102/76 101/70  Pulse: 82 90 (!) 108 100  Resp: 16 18 (!) 24 (!) 27  Temp:      TempSrc:      SpO2: 100% 100% 100% 99%  Weight:      Height:        Intake/Output Summary (Last 24 hours) at 04/10/2020 1552 Last data filed at 04/10/2020 1300 Gross per 24 hour  Intake 200 ml  Output --  Net 200 ml   Filed Weights   04/08/20 1616  Weight: 60.8 kg    Examination:   General: Not in pain or dyspnea. Deconditioned  Neurology: Awake and alert, non focal  E ENT: positive pallor, no icterus, oral mucosa moist Cardiovascular: No JVD. S1-S2 present, rhythmic, no gallops,  rubs, or murmurs. No lower extremity edema. Pulmonary: positive breath sounds bilaterally, adequate air movement, no wheezing, rhonchi or rales. Gastrointestinal. Abdomen soft with no organomegaly, non tender, no rebound or guarding Skin. No rashes Musculoskeletal: no joint deformities     Data Reviewed: I have personally reviewed following labs and imaging studies  CBC: Recent Labs  Lab 04/08/20 1828 04/09/20 0414 04/09/20 1122 04/10/20 0404  WBC 7.2  --   --  6.8  NEUTROABS 3.8  --   --  4.2  HGB 5.9* 6.2* 7.7* 7.5*  HCT 19.8* 20.7* 24.4* 23.6*  MCV 81.8  --   --  83.1  PLT 314  --   --  99991111   Basic Metabolic Panel: Recent Labs  Lab 04/08/20 1828 04/09/20 0414 04/10/20 0404  NA 135 137 138  K 3.4* 3.5 3.3*  CL 95* 104 106  CO2 29 25 26   GLUCOSE 101* 85 85  BUN 21 17 12   CREATININE 0.80 0.74 0.70  CALCIUM 8.8* 8.1* 8.7*  MG  --  2.0  --    GFR: Estimated Creatinine Clearance: 51.8 mL/min (by C-G formula based on SCr of 0.7 mg/dL). Liver Function Tests: Recent Labs  Lab 04/08/20 1828  AST 23  ALT 14  ALKPHOS 102  BILITOT 0.4  PROT 7.3  ALBUMIN 3.5   No results for input(s): LIPASE, AMYLASE in the last 168 hours. No results for input(s): AMMONIA in the last 168 hours. Coagulation Profile: No results for input(s): INR, PROTIME in the last 168 hours. Cardiac Enzymes: No results for input(s): CKTOTAL, CKMB, CKMBINDEX, TROPONINI in the last 168 hours. BNP (last 3 results) No results for input(s): PROBNP in the last 8760 hours. HbA1C: No results for input(s): HGBA1C in the last 72 hours. CBG: No results for input(s): GLUCAP in the last 168 hours. Lipid Profile: No results for input(s): CHOL, HDL, LDLCALC, TRIG, CHOLHDL, LDLDIRECT in the last 72 hours. Thyroid Function Tests: No results for input(s): TSH, T4TOTAL, FREET4, T3FREE, THYROIDAB in the last 72 hours. Anemia Panel: Recent Labs    04/08/20 2013  VITAMINB12 177*  FOLATE 9.2  FERRITIN 8*    TIBC 492*  IRON 30  RETICCTPCT 4.0*      Radiology Studies: I have reviewed all of the imaging during this hospital visit personally     Scheduled Meds: . [MAR Hold] sodium chloride   Intravenous Once  . [MAR Hold] atorvastatin  20 mg Oral Daily  . [MAR Hold] sodium chloride flush  3 mL Intravenous Q12H   Continuous Infusions: . sodium chloride    . lactated ringers 20 mL/hr at 04/10/20 1434  . pantoprozole (PROTONIX) infusion 8 mg/hr (04/10/20 1425)     LOS: 2 days  Amos Gaber Gerome Apley, MD

## 2020-04-11 ENCOUNTER — Telehealth: Payer: Self-pay

## 2020-04-11 ENCOUNTER — Encounter: Payer: Self-pay | Admitting: *Deleted

## 2020-04-11 LAB — BASIC METABOLIC PANEL
Anion gap: 12 (ref 5–15)
BUN: 13 mg/dL (ref 8–23)
CO2: 21 mmol/L — ABNORMAL LOW (ref 22–32)
Calcium: 8.8 mg/dL — ABNORMAL LOW (ref 8.9–10.3)
Chloride: 105 mmol/L (ref 98–111)
Creatinine, Ser: 0.72 mg/dL (ref 0.44–1.00)
GFR calc Af Amer: >60 mL/min (ref >60)
GFR calc non Af Amer: >60 mL/min (ref >60)
Glucose, Bld: 79 mg/dL (ref 70–99)
Potassium: 4.2 mmol/L (ref 3.5–5.1)
Sodium: 138 mmol/L (ref 135–145)

## 2020-04-11 LAB — CBC WITH DIFFERENTIAL/PLATELET
Abs Immature Granulocytes: 0.04 10*3/uL (ref 0.00–0.07)
Basophils Absolute: 0 10*3/uL (ref 0.0–0.1)
Basophils Relative: 0 %
Eosinophils Absolute: 0.2 10*3/uL (ref 0.0–0.5)
Eosinophils Relative: 2 %
HCT: 25.5 % — ABNORMAL LOW (ref 36.0–46.0)
Hemoglobin: 7.7 g/dL — ABNORMAL LOW (ref 12.0–15.0)
Immature Granulocytes: 0 %
Lymphocytes Relative: 23 %
Lymphs Abs: 2.1 10*3/uL (ref 0.7–4.0)
MCH: 26.2 pg (ref 26.0–34.0)
MCHC: 30.2 g/dL (ref 30.0–36.0)
MCV: 86.7 fL (ref 80.0–100.0)
Monocytes Absolute: 0.6 10*3/uL (ref 0.1–1.0)
Monocytes Relative: 6 %
Neutro Abs: 6.3 10*3/uL (ref 1.7–7.7)
Neutrophils Relative %: 69 %
Platelets: 245 10*3/uL (ref 150–400)
RBC: 2.94 MIL/uL — ABNORMAL LOW (ref 3.87–5.11)
RDW: 18.7 % — ABNORMAL HIGH (ref 11.5–15.5)
WBC: 9.3 10*3/uL (ref 4.0–10.5)
nRBC: 0.2 % (ref 0.0–0.2)

## 2020-04-11 NOTE — Progress Notes (Signed)
PT Cancellation Note  Patient Details Name: [name] MRN: [id number] DOB: [birth year]   Cancelled Treatment:    Reason Eval/Treat Not Completed: PT screened, no needs identified, will sign off   Lelon Mast 04/11/2020, 1:11 PM

## 2020-04-11 NOTE — Telephone Encounter (Signed)
Not until I see her

## 2020-04-11 NOTE — TOC Progression Note (Signed)
Transition of Care [facility]) - Progression Note    Patient Details  Name: [name] MRN: [id number] Date of Birth: 19-Mar-1946  Transition of Care [facility] CM/SW Contact  Purcell Mouton, RN Phone Number: 04/11/2020, 12:51 PM  Clinical Narrative:    Pt at nurses station asking to go home and to eat.    Expected Discharge Plan: Home/Self Care Barriers to Discharge: No Barriers Identified  Expected Discharge Plan and Services Expected Discharge Plan: Home/Self Care   Discharge Planning Services: CM Consult   Living arrangements for the past 2 months: Single Family Home Expected Discharge Date: 04/11/20                                     Social Determinants of Health (SDOH) Interventions    Readmission Risk Interventions No flowsheet data found.

## 2020-04-11 NOTE — Telephone Encounter (Signed)
Pt. Called stating that she just got out of the hospital today and they told her to call and schedule a hospital f/u which I got her scheduled on 04/20/20, but the main reason she called is because they told her to stop taking her plavix and aspirin because she had a GI bleed and her stool was still dark last stool she had and they told her to ask her doctor when she should start back on her Plavix and Aspirin.

## 2020-04-11 NOTE — Care Management Important Message (Signed)
Important Message  Patient Details IM Letter given to Gabriel Earing RN Case Manager to present to the Patient Name: LORI-ANNE ABDALLA MRN: [id number] Date of Birth: 12-Nov-1946   Medicare Important Message Given:  Yes     Kerin Salen 04/11/2020, 10:41 AM

## 2020-04-12 NOTE — Telephone Encounter (Signed)
Pt. Aware to stay off of the Aspirin and Plavix until her hospital f/u apt on 04/20/20.

## 2020-04-13 NOTE — Discharge Summary (Signed)
Physician Discharge Summary  [name] [id number] DOB: 28-Feb-1946 DOA: 04/08/2020  PCP: Denita Lung, MD  Admit date: 04/08/2020 Discharge date: 04/11/2020  Admitted From: Home Disposition: Home   Recommendations for Outpatient Follow-up:  1. Follow up with PCP within a week with repeat CBC. Holding aspirin and plavix until melena completely clears. CBC is stable s/p 2u PRBCs with stable vital signs.  Home Health: None Equipment/Devices: None Discharge Condition: Stable CODE STATUS: Full Diet recommendation: Heart healthy as tolerated  Brief/Interim Summary: [name] is a 74 year old female who presented with lightheadedness, fatigue, head pressure and melena. He does have significant past medical history for hypertension, peripheral vascular disease status post revascularization, bladder cancer and history of TIA. Patient reports persistent fatigue, lightheadedness, and headaches for several weeks. Positive melena for several days. On her initial physical examination blood pressure 131/72, heart rate 106, respirate 20, temperature 99, oxygen saturation 97%, lungs clear to auscultation, heart S1-S2, present rhythmic, abdomen soft nontender, no lower extremity edema. Sodium 135, potassium 3.4, chloride 95, bicarb 29, glucose 101, BUN 21, creatinine 0.8, white count 7.2, hemoglobin 5.9, hematocrit 19.8, platelets 314. SARS COVID-19 negative.  Patient tolerated well 2 units PRBC transfusion, stable hemodynamics. EGD by Dr. Benson Norway 5/25 revealed a bleeding angiodysplastic lesion in the duodenum which was treated with a monopolar probe. Clips (MR unsafe) were placed. Tattooed.   Hgb remained stable and she's tolerated a clear diet. She's having some dark stools but less and is requesting discharge. Aspirin and plavix will be held until PCP follow up.                        Discharge Diagnoses:  Principal Problem:   Acute GI bleeding Active Problems:   Hypertension    PAD (peripheral artery disease) (HCC)   Symptomatic anemia   Bladder cancer (HCC)  Acute blood loss anemia due to upper GI bleed due to duodenal AVM which was treated at time of EGD 5/25. s/p 2u PRBCs, hgb has remained stable at 7.7g/dl.  - Continue to hold antiplatelet therapy during immediate recovery period. This risks clotting though is reasonable in the setting of transfusion-dependent GI bleeding. The risks and benefits were discussed with the patient and her husband.  - Will need close follow up with CBC monitoring to reconsider antiplatelet therapy. Pt knows discharge today is contingent upon close follow up and strict return precautions.  - Start iron supplement at PCP follow up to avoid confusion with dark stools.  Peripheral vascular disease/ dyslipidemia. Holding on asa and clopidogrel. Patient had a recent revascularization this year so will need antiplatelet therapy on an ongoing basis. Continue statin.  HTN: Continue home medications   Bladder cancer. No signs of hematuria, will follow up with urology as outpatient for BCG treatment.   Iron deficiency. Serum iron at 30, TIBC 492, transferrin saturation 6 and ferritin of 8. Given IV iron as inpatient, will continue iron eventually as outpatient as above.   Hypokalemia. Improved with supplementation.  Discharge Instructions Discharge Instructions    Diet - low sodium heart healthy   Complete by: As directed    Discharge instructions   Complete by: As directed    Monitor your stools. Once they are no longer black and as long as you have no red blood in them, you should restart aspirin and plavix until you follow up with your primary doctor early next week. Hold aspirin and plavix until that time.  You will  need to start taking oral iron supplement, but do not start this yet (you received IV iron) as it can make your stools black.  You should seek medical attention right away if you notice red blood in stools or your  symptoms return.   Increase activity slowly   Complete by: As directed      Allergies as of 04/11/2020      Reactions   Latex Rash      Medication List    TAKE these medications   acetaminophen 500 MG tablet Commonly known as: TYLENOL Take 1,000 mg by mouth every 6 (six) hours as needed for mild pain or moderate pain.   aspirin EC 81 MG tablet Take 81 mg by mouth every evening.   atorvastatin 20 MG tablet Commonly known as: LIPITOR TAKE 1 TABLET BY MOUTH ONCE DAILY What changed: when to take this   AZO STANDARD MAXIMUM STRENGTH PO Take 1 tablet by mouth 3 (three) times daily as needed (urinary discomfort with bladder treatment).   BENADRYL ITCH STOPPING EX Apply 1 application topically 4 (four) times daily as needed (skin rash/irritation.).   clopidogrel 75 MG tablet Commonly known as: PLAVIX Take 1 tablet (75 mg total) by mouth daily. Resume once you no longer have any bleeding in your urine or in your stool and you have completed all your scheduled procedures. What changed: when to take this   docusate sodium 100 MG capsule Commonly known as: COLACE Take 100 mg by mouth 2 (two) times daily.   hydrochlorothiazide 25 MG tablet Commonly known as: HYDRODIURIL TAKE 1 TABLET BY MOUTH EVERY DAY   hydroxypropyl methylcellulose / hypromellose 2.5 % ophthalmic solution Commonly known as: ISOPTO TEARS / GONIOVISC Place 1 drop into both eyes 4 (four) times daily as needed for dry eyes.   Loratadine 10 MG Caps Take 1 capsule by mouth daily.   multivitamin Liqd Take 15 mLs by mouth 2 (two) times a week. Nature's Plus Source of Life Gold Liquid   oxyCODONE 5 MG immediate release tablet Commonly known as: Oxy IR/ROXICODONE Take 1 tablet (5 mg total) by mouth every 6 (six) hours as needed for moderate pain.   polyethylene glycol 17 g packet Commonly known as: MIRALAX / GLYCOLAX Take 17 g by mouth daily.   senna 8.6 MG tablet Commonly known as: SENOKOT Take 1 tablet by  mouth daily.   simethicone 125 MG chewable tablet Commonly known as: MYLICON Chew 0000000 mg by mouth every 6 (six) hours as needed for flatulence.   Vicks Vaporub 4.73-1.2-2.6 % Oint Apply 1 application topically 3 (three) times daily as needed (leg discomfort).   Vitamin D 50 MCG (2000 UT) tablet Take 2,000 Units by mouth every evening.      Follow-up Information    Denita Lung, MD. Schedule an appointment as soon as possible for a visit in 1 week(s).   Specialty: Family Medicine Why: with blood work to monitor blood counts Contact information: [place] [address] [phone]        Juanita Craver, MD. Schedule an appointment as soon as possible for a visit in 2 week(s).   Specialty: Gastroenterology Contact information: [address], [place] [address] [id number]          Allergies  Allergen Reactions  . Latex Rash    Consultations:  GI, Dr. Benson Norway  Procedures/Studies: CT Head Wo Contrast  Result Date: 04/08/2020 CLINICAL DATA:  Headache, dizziness, nausea for 3 days EXAM:  CT HEAD WITHOUT CONTRAST TECHNIQUE: Contiguous axial images were obtained from the base of the skull through the vertex without intravenous contrast. COMPARISON:  02/14/2013 FINDINGS: Brain: No acute infarct or hemorrhage. Lateral ventricles and midline structures are unremarkable. There are chronic small vessel ischemic changes scattered throughout the periventricular white matter. No acute extra-axial fluid collections. No mass effect. Vascular: No hyperdense vessel or unexpected calcification. Skull: Normal. Negative for fracture or focal lesion. Sinuses/Orbits: Chronic polypoid mucosal thickening within a posterior left ethmoid air cell. Remaining sinuses are clear. Other: None. IMPRESSION: 1. No acute intracranial process. Electronically Signed   By: Randa Ngo M.D.   On: 04/08/2020 18:59     EGD 04/10/2020 by Dr. Benson Norway: Impression:                - Normal esophagus.                           - Normal stomach.                           - A single bleeding angiodysplastic lesion in the                            duodenum. Treated with a monopolar probe. Clips (MR                            unsafe) were placed. Tattooed.                           - The examined portion of the jejunum was normal.                           - No specimens collected. Recommendation:           - Return patient to hospital ward for ongoing care.                           - Clear liquid diet.                           - Monitor HGB and transfuse as necessary.  Subjective: Feels well, getting around without chest pain, dyspnea, abd pain, palpitations or lightheadedness.  Discharge Exam: Vitals:   04/11/20 0043 04/11/20 0430  BP: 112/60 114/61  Pulse: 70 73  Resp: 18 20  Temp: 98.1 F (36.7 C) 98 F (36.7 C)  SpO2: 100% 100%   General: Pt is alert, awake, not in acute distress Cardiovascular: RRR, S1/S2 +, no rubs, no gallops Respiratory: CTA bilaterally, no wheezing, no rhonchi Abdominal: Soft, NT, ND, bowel sounds + Extremities: No edema, no cyanosis  Labs: BNP (last 3 results) No results for input(s): BNP in the last 8760 hours. Basic Metabolic Panel: Recent Labs  Lab 04/08/20 1828 04/09/20 0414 04/10/20 0404 04/11/20 0350  NA 135 137 138 138  K 3.4* 3.5 3.3* 4.2  CL 95* 104 106 105  CO2 29 25 26  21*  GLUCOSE 101* 85 85 79  BUN 21 17 12 13   CREATININE 0.80 0.74 0.70 0.72  CALCIUM 8.8* 8.1* 8.7* 8.8*  MG  --  2.0  --   --  Liver Function Tests: Recent Labs  Lab 04/08/20 1828  AST 23  ALT 14  ALKPHOS 102  BILITOT 0.4  PROT 7.3  ALBUMIN 3.5   No results for input(s): LIPASE, AMYLASE in the last 168 hours. No results for input(s): AMMONIA in the last 168 hours. CBC: Recent Labs  Lab 04/08/20 1828 04/09/20 0414 04/09/20 1122 04/10/20 0404 04/11/20 0350  WBC 7.2  --   --  6.8 9.3  NEUTROABS 3.8  --   --  4.2 6.3   HGB 5.9* 6.2* 7.7* 7.5* 7.7*  HCT 19.8* 20.7* 24.4* 23.6* 25.5*  MCV 81.8  --   --  83.1 86.7  PLT 314  --   --  257 245   Cardiac Enzymes: No results for input(s): CKTOTAL, CKMB, CKMBINDEX, TROPONINI in the last 168 hours. BNP: Invalid input(s): POCBNP CBG: No results for input(s): GLUCAP in the last 168 hours. D-Dimer No results for input(s): DDIMER in the last 72 hours. Hgb A1c No results for input(s): HGBA1C in the last 72 hours. Lipid Profile No results for input(s): CHOL, HDL, LDLCALC, TRIG, CHOLHDL, LDLDIRECT in the last 72 hours. Thyroid function studies No results for input(s): TSH, T4TOTAL, T3FREE, THYROIDAB in the last 72 hours.  Invalid input(s): FREET3 Anemia work up No results for input(s): VITAMINB12, FOLATE, FERRITIN, TIBC, IRON, RETICCTPCT in the last 72 hours. Urinalysis    Component Value Date/Time   COLORURINE YELLOW 04/08/2020 2013   APPEARANCEUR CLEAR 04/08/2020 2013   LABSPEC 1.012 04/08/2020 2013   PHURINE 6.0 04/08/2020 2013   GLUCOSEU NEGATIVE 04/08/2020 2013   HGBUR NEGATIVE 04/08/2020 2013   BILIRUBINUR NEGATIVE 04/08/2020 2013   BILIRUBINUR neg 08/13/2011 [place] 04/08/2020 2013   PROTEINUR NEGATIVE 04/08/2020 2013   UROBILINOGEN neg 08/13/2011 1229   UROBILINOGEN 0.2 06/02/2011 0942   NITRITE NEGATIVE 04/08/2020 2013   LEUKOCYTESUR NEGATIVE 04/08/2020 2013    Microbiology Recent Results (from the past 240 hour(s))  SARS Coronavirus 2 by RT PCR (hospital order, performed in [facility] hospital lab) Nasopharyngeal Nasopharyngeal Swab     Status: None   Collection Time: 04/08/20  9:09 PM   Specimen: Nasopharyngeal Swab  Result Value Ref Range Status   SARS Coronavirus 2 NEGATIVE NEGATIVE Final    Comment: (NOTE) SARS-CoV-2 target nucleic acids are NOT DETECTED. The SARS-CoV-2 RNA is generally detectable in upper and lower respiratory specimens during the acute phase of infection. The lowest concentration of  SARS-CoV-2 viral copies this assay can detect is 250 copies / mL. A negative result does not preclude SARS-CoV-2 infection and should not be used as the sole basis for treatment or other patient management decisions.  A negative result may occur with improper specimen collection / handling, submission of specimen other than nasopharyngeal swab, presence of viral mutation(s) within the areas targeted by this assay, and inadequate number of viral copies (<250 copies / mL). A negative result must be combined with clinical observations, patient history, and epidemiological information. Fact Sheet for Patients:   StrictlyIdeas.no Fact Sheet for Healthcare Providers: BankingDealers.co.za This test is not yet approved or cleared  by the [place] FDA and has been authorized for detection and/or diagnosis of SARS-CoV-2 by FDA under an Emergency Use Authorization (EUA).  This EUA will remain in effect (meaning this test can be used) for the duration of the COVID-19 declaration under Section 564(b)(1) of the Act, 21 U.S.C. section 360bbb-3(b)(1), unless the authorization is terminated or revoked sooner. Performed at [facility], [place] [address], [place], [place] [address]     Time coordinating discharge: Approximately 40 minutes  Patrecia Pour, MD  [facility] 04/13/2020, 3:12 PM

## 2020-04-17 DIAGNOSIS — Z5111 Encounter for antineoplastic chemotherapy: Secondary | ICD-10-CM | POA: Diagnosis not present

## 2020-04-17 DIAGNOSIS — C678 Malignant neoplasm of overlapping sites of bladder: Secondary | ICD-10-CM | POA: Diagnosis not present

## 2020-04-20 ENCOUNTER — Encounter: Payer: Self-pay | Admitting: Family Medicine

## 2020-04-20 ENCOUNTER — Ambulatory Visit (INDEPENDENT_AMBULATORY_CARE_PROVIDER_SITE_OTHER): Payer: Medicare HMO | Admitting: Family Medicine

## 2020-04-20 ENCOUNTER — Other Ambulatory Visit: Payer: Self-pay

## 2020-04-20 VITALS — BP 110/64 | HR 86 | Temp 96.0°F | Wt 134.6 lb

## 2020-04-20 DIAGNOSIS — K31819 Angiodysplasia of stomach and duodenum without bleeding: Secondary | ICD-10-CM

## 2020-04-20 DIAGNOSIS — D509 Iron deficiency anemia, unspecified: Secondary | ICD-10-CM

## 2020-04-20 DIAGNOSIS — I739 Peripheral vascular disease, unspecified: Secondary | ICD-10-CM

## 2020-04-20 NOTE — Progress Notes (Signed)
   Subjective:    Patient ID: [name], female    DOB: 09/04/1946, 74 y.o.   MRN: [id number]  HPI She is here for a follow-up on recent hospitalization for treatment of anemia that was subsequently found to be a gastric AVM.  She was given 2 units of packed cells.  She was taken off her Plavix and aspirin for the time being.  She has had a recent endarterectomy.  She also has a history of bladder cancer and is being followed for that.  She was also found to be iron deficient.  She states that her stool is almost back to normal.  She has had no nausea, vomiting, abdominal pain other than some slight discomfort that she relates to dealing with her bladder cancer. She does complain of some difficulty with constipation and has been using MiraLAX as well as occasional use of Colace.  Review of Systems     Objective:   Physical Exam Alert and in no distress.  Recent hospitalization including discharge summary, endoscopy and blood work was reviewed. Cardiac exam shows regular rhythm without murmurs or gallops.  Lungs clear to auscultation.      Assessment & Plan:  Gastric AVM - Plan: CBC with Differential/Platelet, Comprehensive metabolic panel  PVD (peripheral vascular disease) (HCC)  Iron deficiency anemia, unspecified iron deficiency anemia type - Plan: CBC with Differential/Platelet I will discuss follow-up after I get the blood work especially in regard to when to start back on Plavix and aspirin due to her underlying PVD.  I will discuss this both with GI and with the vascular surgeons.  Also encouraged her to take a multivitamin with iron.  She has had some difficulty with constipation issues and she will continue on MiraLAX as well as occasional use of Colace.  Cautioned against long-term use of Colace.

## 2020-04-21 LAB — CBC WITH DIFFERENTIAL/PLATELET
Basophils Absolute: 0 10*3/uL (ref 0.0–0.2)
Basos: 0 %
EOS (ABSOLUTE): 0.1 10*3/uL (ref 0.0–0.4)
Eos: 1 %
Hematocrit: 32.9 % — ABNORMAL LOW (ref 34.0–46.6)
Hemoglobin: 10.4 g/dL — ABNORMAL LOW (ref 11.1–15.9)
Immature Grans (Abs): 0 10*3/uL (ref 0.0–0.1)
Immature Granulocytes: 0 %
Lymphocytes Absolute: 2.3 10*3/uL (ref 0.7–3.1)
Lymphs: 33 %
MCH: 27.6 pg (ref 26.6–33.0)
MCHC: 31.6 g/dL (ref 31.5–35.7)
MCV: 87 fL (ref 79–97)
Monocytes Absolute: 0.5 10*3/uL (ref 0.1–0.9)
Monocytes: 7 %
Neutrophils Absolute: 4.1 10*3/uL (ref 1.4–7.0)
Neutrophils: 59 %
Platelets: 290 10*3/uL (ref 150–450)
RBC: 3.77 x10E6/uL (ref 3.77–5.28)
RDW: 18.4 % — ABNORMAL HIGH (ref 11.7–15.4)
WBC: 7 10*3/uL (ref 3.4–10.8)

## 2020-04-21 LAB — COMPREHENSIVE METABOLIC PANEL
ALT: 13 IU/L (ref 0–32)
AST: 23 IU/L (ref 0–40)
Albumin/Globulin Ratio: 1.2 (ref 1.2–2.2)
Albumin: 4.1 g/dL (ref 3.7–4.7)
Alkaline Phosphatase: 130 IU/L — ABNORMAL HIGH (ref 48–121)
BUN/Creatinine Ratio: 12 (ref 12–28)
BUN: 9 mg/dL (ref 8–27)
Bilirubin Total: <0.2 mg/dL (ref 0.0–1.2)
CO2: 26 mmol/L (ref 20–29)
Calcium: 9.3 mg/dL (ref 8.7–10.3)
Chloride: 98 mmol/L (ref 96–106)
Creatinine, Ser: 0.78 mg/dL (ref 0.57–1.00)
GFR calc Af Amer: 87 mL/min/{1.73_m2} (ref >59)
GFR calc non Af Amer: 76 mL/min/{1.73_m2} (ref >59)
Globulin, Total: 3.3 g/dL (ref 1.5–4.5)
Glucose: 73 mg/dL (ref 65–99)
Potassium: 3.6 mmol/L (ref 3.5–5.2)
Sodium: 139 mmol/L (ref 134–144)
Total Protein: 7.4 g/dL (ref 6.0–8.5)

## 2020-04-24 DIAGNOSIS — D509 Iron deficiency anemia, unspecified: Secondary | ICD-10-CM | POA: Diagnosis not present

## 2020-04-24 DIAGNOSIS — K5904 Chronic idiopathic constipation: Secondary | ICD-10-CM | POA: Diagnosis not present

## 2020-04-24 DIAGNOSIS — Z86010 Personal history of colonic polyps: Secondary | ICD-10-CM | POA: Diagnosis not present

## 2020-04-24 DIAGNOSIS — K5730 Diverticulosis of large intestine without perforation or abscess without bleeding: Secondary | ICD-10-CM | POA: Diagnosis not present

## 2020-04-25 ENCOUNTER — Other Ambulatory Visit: Payer: Self-pay

## 2020-04-25 ENCOUNTER — Telehealth: Payer: Self-pay | Admitting: Family Medicine

## 2020-04-25 DIAGNOSIS — D509 Iron deficiency anemia, unspecified: Secondary | ICD-10-CM

## 2020-04-25 NOTE — Telephone Encounter (Signed)
I discussed this with Dr. Benson Norway and he states that she can get back on both thinners and should stay on them as per her normal protocol which is daily use.

## 2020-04-25 NOTE — Telephone Encounter (Signed)
Pt was advised . Pt would like to know if she can stop asa or take it QOD. Please advise [facility]

## 2020-04-25 NOTE — Telephone Encounter (Signed)
Pt called to get labs results, talk about blood thinner and Dr. Collene Mares.  Please call her at [phone]

## 2020-04-25 NOTE — Telephone Encounter (Signed)
See message below. KH 

## 2020-04-26 NOTE — Telephone Encounter (Signed)
Pt husband wil have pt to call back. [place]

## 2020-04-26 NOTE — Telephone Encounter (Signed)
Pt was advised K[facility] 

## 2020-05-21 ENCOUNTER — Other Ambulatory Visit: Payer: Self-pay | Admitting: Family Medicine

## 2020-05-21 DIAGNOSIS — I70213 Atherosclerosis of native arteries of extremities with intermittent claudication, bilateral legs: Secondary | ICD-10-CM

## 2020-05-21 DIAGNOSIS — I739 Peripheral vascular disease, unspecified: Secondary | ICD-10-CM

## 2020-05-25 ENCOUNTER — Encounter: Payer: Self-pay | Admitting: Family Medicine

## 2020-05-25 ENCOUNTER — Other Ambulatory Visit: Payer: Self-pay

## 2020-05-25 ENCOUNTER — Ambulatory Visit (INDEPENDENT_AMBULATORY_CARE_PROVIDER_SITE_OTHER): Payer: Medicare HMO | Admitting: Family Medicine

## 2020-05-25 VITALS — BP 100/62 | HR 90 | Temp 98.7°F | Wt 132.2 lb

## 2020-05-25 DIAGNOSIS — R21 Rash and other nonspecific skin eruption: Secondary | ICD-10-CM | POA: Diagnosis not present

## 2020-05-25 DIAGNOSIS — D509 Iron deficiency anemia, unspecified: Secondary | ICD-10-CM

## 2020-05-25 DIAGNOSIS — K31819 Angiodysplasia of stomach and duodenum without bleeding: Secondary | ICD-10-CM

## 2020-05-25 LAB — CBC WITH DIFFERENTIAL/PLATELET
Basophils Absolute: 0.1 10*3/uL (ref 0.0–0.2)
Basos: 1 %
EOS (ABSOLUTE): 0.2 10*3/uL (ref 0.0–0.4)
Eos: 4 %
Hematocrit: 39.9 % (ref 34.0–46.6)
Hemoglobin: 12.8 g/dL (ref 11.1–15.9)
Immature Grans (Abs): 0 10*3/uL (ref 0.0–0.1)
Immature Granulocytes: 0 %
Lymphocytes Absolute: 2.5 10*3/uL (ref 0.7–3.1)
Lymphs: 38 %
MCH: 27.5 pg (ref 26.6–33.0)
MCHC: 32.1 g/dL (ref 31.5–35.7)
MCV: 86 fL (ref 79–97)
Monocytes Absolute: 0.4 10*3/uL (ref 0.1–0.9)
Monocytes: 6 %
Neutrophils Absolute: 3.3 10*3/uL (ref 1.4–7.0)
Neutrophils: 51 %
Platelets: 197 10*3/uL (ref 150–450)
RBC: 4.66 x10E6/uL (ref 3.77–5.28)
RDW: 17.8 % — ABNORMAL HIGH (ref 11.7–15.4)
WBC: 6.6 10*3/uL (ref 3.4–10.8)

## 2020-05-25 MED ORDER — DOXYCYCLINE HYCLATE 100 MG PO TABS: 100.0000 mg | ORAL_TABLET | Freq: Two times a day (BID) | ORAL | 0 refills | Status: DC

## 2020-05-25 NOTE — Progress Notes (Signed)
   Subjective:    Patient ID: [name], female    DOB: 1946/07/08, 74 y.o.   MRN: [id number]  HPI She complains of a lesion on her left cheek.  That is slightly indurated and causing some slight irritation.  She also states that since her surgery she has had difficulty with intermittent inguinal rash and recently has noted swelling of the left fingertip. She also needs follow-up on her iron deficiency anemia.  Now she does call Review of Systems     Objective:   Physical Exam Alert and in no distress.  Exam of the left cheek does show some swelling and induration near a mole approximately 2 to 3 cm.  Left index finger does show some slight swelling but there is no evidence of paronychia at the present time.  Exam of the inguinal area shows no lesions.       Assessment & Plan:  Rash - Plan: doxycycline (VIBRA-TABS) 100 MG tablet  Iron deficiency anemia, unspecified iron deficiency anemia type - Plan: CBC with Differential  Gastric AVM I placed her on an antibiotic however not sure how much good is going to do.  I think is worthwhile trying but may need to refer her on for the facial lesion and the recurrent inguinal rash.

## 2020-06-04 ENCOUNTER — Ambulatory Visit (INDEPENDENT_AMBULATORY_CARE_PROVIDER_SITE_OTHER): Payer: Self-pay | Admitting: Physician Assistant

## 2020-06-04 ENCOUNTER — Other Ambulatory Visit: Payer: Self-pay

## 2020-06-04 ENCOUNTER — Ambulatory Visit (HOSPITAL_COMMUNITY)
Admission: RE | Admit: 2020-06-04 | Discharge: 2020-06-04 | Disposition: A | Discharge status: Home / Self Care | Payer: Medicare HMO | Source: Ambulatory Visit | Attending: Surgery | Admitting: Surgery

## 2020-06-04 VITALS — BP 127/77 | HR 71 | Temp 97.5°F | Resp 20 | Ht 63.0 in | Wt 131.3 lb

## 2020-06-04 DIAGNOSIS — I70213 Atherosclerosis of native arteries of extremities with intermittent claudication, bilateral legs: Secondary | ICD-10-CM | POA: Insufficient documentation

## 2020-06-04 DIAGNOSIS — I739 Peripheral vascular disease, unspecified: Secondary | ICD-10-CM | POA: Insufficient documentation

## 2020-06-04 DIAGNOSIS — Z95828 Presence of other vascular implants and grafts: Secondary | ICD-10-CM

## 2020-06-04 NOTE — Progress Notes (Signed)
Office Note     CC:  follow up Requesting Provider:  Denita Lung, MD  HPI: [name] is a 74 y.o. (11-10-1946) female who presents for follow up of aorto-bifemoral bypass graft done by Dr. Trula Slade on 05/16/2011 for iliac occlusion and claudication. She was lost to follow up for approximately 7 years but then presented in February 2021 and was seen by Dr. Trula Slade and was having some claudication and rest pain like symptoms but no non healing wounds. She subsequently underwent Angiogram of her BLE that showed 70% stenosis of her left femoral anastomosis  With short segment occlusion of her proximal left SFA and occluded right SFA. She therefore underwent left common femoral, profunda, and SFA endarterectomy of her left leg with patch angioplasty on 01/27/20.  She was last seen in March for post op follow up by Dr. Trula Slade. She was doing well at that time. She was having some shooting pains in left thigh felt to be neuropathic in nature but no claudication or rest pain like symptoms. She was scheduled to have routine follow up with duplex in 4 months  She presents today for her follow up visit and duplex studies. She continues to complain of shooting pains and numbness in left thigh which is what she was experiencing at her last visit. She explains that it sort of comes and goes both during day and at night. No associated aggravating or elevating factors. Says some days it happens 4-5 times other days maybe once. She otherwise denies any pain on ambulation, pain that keeps her up at night or non healing wounds. She says over all her left leg in particular feels much better than it did prior to her surgery.   The pt is on a statin for cholesterol management.  The pt is on a daily aspirin.   Other AC: Plavix The pt is on HCTZ, for hypertension.   The pt is not diabetic.  Tobacco hx:  Current smoker  Past Medical History:  Diagnosis Date  . Abdominal wall hernia   . Blood transfusion     30+ yrs. ago  . Cancer (HCC)    Right Breast  . Hyperlipidemia   . Hypertension   . Peripheral vascular disease ([place])   . Seasonal allergies   . TIA (transient ischemic attack)     Past Surgical History:  Procedure Laterality Date  . ABDOMINAL AORTOGRAM W/LOWER EXTREMITY N/A 01/10/2020   Procedure: ABDOMINAL AORTOGRAM W/LOWER EXTREMITY;  Surgeon: Serafina Mitchell, MD;  Location: [facility];  Service: Cardiovascular;  Laterality: N/A;  Bilateral   . ABDOMINAL HYSTERECTOMY    . Aortobifemoral BPG  05/15/11   Using a 14 x8 bifurcated Dacron Graft  . BREAST SURGERY     right lumpectomy  . BREAST SURGERY     multiple breast surgeries  . CATARACT EXTRACTION Right 10/2018  . COLONOSCOPY  2012   [facility]  . CYSTOSCOPY W/ RETROGRADES Bilateral 05/27/2019   Procedure: CYSTOSCOPY WITH BILATERAL RETROGRADE PYELOGRAM;  Surgeon: [name], MD;  Location: [facility];  Service: Urology;  Laterality: Bilateral;  . CYSTOSCOPY WITH BIOPSY N/A 05/27/2019   Procedure: CYSTOSCOPY WITH BLADDER  BIOPSY;  Surgeon: [name], MD;  Location: [facility];  Service: Urology;  Laterality: N/A;  . DILATION AND CURETTAGE OF UTERUS     3-4 in late 20"s  . ENDARTERECTOMY FEMORAL Left 01/27/2020   REDO OF LEFT ENDARTERECTOMY FEMORAL (Left )  . ENDARTERECTOMY FEMORAL Left 01/27/2020  Procedure: REDO OF LEFT ENDARTERECTOMY FEMORAL;  Surgeon: Serafina Mitchell, MD;  Location: [facility];  Service: Vascular;  Laterality: Left;  . ENTEROSCOPY Left 04/10/2020   Procedure: ENTEROSCOPY;  Surgeon: Carol Ada, MD;  Location: [facility];  Service: Endoscopy;  Laterality: Left;  . HEMOSTASIS CLIP PLACEMENT  04/10/2020   Procedure: HEMOSTASIS CLIP PLACEMENT;  Surgeon: Carol Ada, MD;  Location: [facility];  Service: Endoscopy;;  3 clips  . HERNIA REPAIR    . HOT HEMOSTASIS N/A 04/10/2020   Procedure: HOT HEMOSTASIS (ARGON PLASMA COAGULATION/BICAP);  Surgeon: Carol Ada, MD;  Location: [facility];   Service: Endoscopy;  Laterality: N/A;  . NM MYOCAR PERF WALL MOTION  03/11/2011   mild to moderate perfusion defect in the apical and apical lateral region c/w an infarct/scar  . PATCH ANGIOPLASTY Left 01/27/2020   Procedure: [place];  Surgeon: Serafina Mitchell, MD;  Location: [facility];  Service: Vascular;  Laterality: Left;  . SUBMUCOSAL TATTOO INJECTION  04/10/2020   Procedure: SUBMUCOSAL TATTOO INJECTION;  Surgeon: Carol Ada, MD;  Location: [facility];  Service: Endoscopy;;  . VASCULAR SURGERY  05/15/11   aobifemoral bypass graft  . VENTRAL HERNIA REPAIR  12/26/2011   Procedure: LAPAROSCOPIC VENTRAL HERNIA;  Surgeon: Adin Hector, MD;  Location: [facility];  Service: General;  Laterality: N/A;  With Mesh    Social History   Socioeconomic History  . Marital status: Married    Spouse name: Not on file  . Number of children: Not on file  . Years of education: Not on file  . Highest education level: Not on file  Occupational History  . Not on file  Tobacco Use  . Smoking status: Current Some Day Smoker    Packs/day: 0.50    Years: 30.00    Pack years: 15.00    Types: Cigarettes  . Smokeless tobacco: Never Used  . Tobacco comment: pt states that she uses the smokeless cig and is trying to quit  Vaping Use  . Vaping Use: Never used  Substance and Sexual Activity  . Alcohol use: No    Comment: occasional alcohol intake  . Drug use: No  . Sexual activity: Yes  Other Topics Concern  . Not on file  Social History Narrative  . Not on file   Social Determinants of Health   Financial Resource Strain:   . Difficulty of Paying Living Expenses:   Food Insecurity:   . Worried About Charity fundraiser in the Last Year:   . Arboriculturist in the Last Year:   Transportation Needs:   . Film/video editor (Medical):   [name] Lack of Transportation (Non-Medical):   Physical Activity:   . Days of Exercise per Week:   . Minutes of  Exercise per Session:   Stress:   . Feeling of Stress :   Social Connections:   . Frequency of Communication with Friends and Family:   . Frequency of Social Gatherings with Friends and Family:   . Attends Religious Services:   . Active Member of Clubs or Organizations:   . Attends Archivist Meetings:   [name] Marital Status:   Intimate Partner Violence:   . Fear of Current or Ex-Partner:   . Emotionally Abused:   [name] Physically Abused:   . Sexually Abused:     Family History  Problem Relation Age of Onset  . Heart attack Sister   . Heart attack Brother   .  Stroke Sister   . Cancer Mother        BREAST, BRAIN  . Heart disease Mother   . Cancer Paternal Aunt        breast ca  . Cancer Paternal Grandmother        unknown ca    Current Outpatient Medications  Medication Sig Dispense Refill  . acetaminophen (TYLENOL) 500 MG tablet Take 1,000 mg by mouth every 6 (six) hours as needed for mild pain or moderate pain.    [name] aspirin EC 81 MG tablet Take 81 mg by mouth every evening.     [name] atorvastatin (LIPITOR) 20 MG tablet TAKE 1 TABLET BY MOUTH EVERY DAY 90 tablet 3  . Camphor-Eucalyptus-Menthol (VICKS VAPORUB) 4.73-1.2-2.6 % OINT Apply 1 application topically 3 (three) times daily as needed (leg discomfort).    . Cholecalciferol (VITAMIN D) 50 MCG (2000 UT) tablet Take 2,000 Units by mouth every evening.     . clopidogrel (PLAVIX) 75 MG tablet Take 1 tablet (75 mg total) by mouth daily. Resume once you no longer have any bleeding in your urine or in your stool and you have completed all your scheduled procedures. 90 tablet 3  . diphenhydrAMINE HCl (BENADRYL ITCH STOPPING EX) Apply 1 application topically 4 (four) times daily as needed (skin rash/irritation.).    [name] docusate sodium (COLACE) 100 MG capsule Take 100 mg by mouth 2 (two) times daily.    [name] doxycycline (VIBRA-TABS) 100 MG tablet Take 1 tablet (100 mg total) by mouth 2 (two) times daily. 28 tablet 0  .  hydrochlorothiazide (HYDRODIURIL) 25 MG tablet TAKE 1 TABLET BY MOUTH EVERY DAY (Patient taking differently: Take 25 mg by mouth daily. ) 90 tablet 0  . hydroxypropyl methylcellulose / hypromellose (ISOPTO TEARS / GONIOVISC) 2.5 % ophthalmic solution Place 1 drop into both eyes 4 (four) times daily as needed for dry eyes.     . Multiple Vitamin (MULTIVITAMIN) LIQD Take 15 mLs by mouth 2 (two) times a week. Nature's Plus Source of Life Gold Liquid    . oxyCODONE (OXY IR/ROXICODONE) 5 MG immediate release tablet Take 1 tablet (5 mg total) by mouth every 6 (six) hours as needed for moderate pain. 10 tablet 0  . Phenazopyridine HCl (AZO STANDARD MAXIMUM STRENGTH PO) Take 1 tablet by mouth 3 (three) times daily as needed (urinary discomfort with bladder treatment).    . polyethylene glycol (MIRALAX / GLYCOLAX) 17 g packet Take 17 g by mouth daily.    [name] senna (SENOKOT) 8.6 MG tablet Take 1 tablet by mouth daily.     . simethicone (MYLICON) 737 MG chewable tablet Chew 125 mg by mouth every 6 (six) hours as needed for flatulence.     No current facility-administered medications for this visit.    Allergies  Allergen Reactions  . Latex Rash     REVIEW OF SYSTEMS:  [X]  denotes positive finding, [ ]  denotes negative finding Cardiac  Comments:  Chest pain or chest pressure:    Shortness of breath upon exertion:    Short of breath when lying flat:    Irregular heart rhythm:        Vascular    Pain in calf, thigh, or hip brought on by ambulation:    Pain in feet at night that wakes you up from your sleep:     Blood clot in your veins:    Leg swelling:         Pulmonary    Oxygen at  home:    Productive cough:     Wheezing:         Neurologic    Sudden weakness in arms or legs:     Sudden numbness in arms or legs:     Sudden onset of difficulty speaking or slurred speech:    Temporary loss of vision in one eye:     Problems with dizziness:         Gastrointestinal    Blood in stool:       Vomited blood:         Genitourinary    Burning when urinating:     Blood in urine:        Psychiatric    Major depression:         Hematologic    Bleeding problems:    Problems with blood clotting too easily:        Skin    Rashes or ulcers:        Constitutional    Fever or chills:      PHYSICAL EXAMINATION:  Vitals:   06/04/20 1014  BP: 127/77  Pulse: 71  Resp: 20  Temp: (!) 97.5 F (36.4 C)  TempSrc: Temporal  SpO2: 99%  Weight: 131 lb 4.8 oz (59.6 kg)  Height: 5\' 3"  (1.6 m)    General:  WDWN in NAD; vital signs documented above Gait: Normal HENT: WNL, normocephalic Pulmonary: normal non-labored breathing , without Rales, rhonchi,  wheezing Cardiac: regular HR, without  Murmurs without carotid bruit Abdomen: soft, NT, no masses Vascular Exam/Pulses:  Right Left  Radial 2+ (normal) 2+ (normal)  Femoral 2+ (normal) 2+ (normal)  Popliteal Not palpable Not palpable  DP Not palpable 2+ (normal)  PT 1+ (weak) 2+ (normal)   Extremities: without ischemic changes, without Gangrene , without cellulitis; without open wounds;  Musculoskeletal: no muscle wasting or atrophy  Neurologic: A&O X 3;  No focal weakness or paresthesias are detected Psychiatric:  The pt has Normal affect.   Non-Invasive Vascular Imaging:     Abdominal Aorta Findings:  +--------+-------+----------+----------+--------+--------+--------+  LocationAP (cm)Trans (cm)PSV (cm/s)WaveformThrombusComments  +--------+-------+----------+----------+--------+--------+--------+  Proximal         46                  +--------+-------+----------+----------+--------+--------+--------+  Mid            45                  +--------+-------+----------+----------+--------+--------+--------+  Distal          82                  +--------+-------+----------+----------+--------+--------+--------+    Right Graft #1:  +------------------+--------+--------+----------+--------------+           PSV cm/sStenosisWaveform Comments     +------------------+--------+--------+----------+--------------+  Inflow      81       biphasic          +------------------+--------+--------+----------+--------------+  Prox Anastomosis                       +------------------+--------+--------+----------+--------------+  Proximal Graft  45                     +------------------+--------+--------+----------+--------------+  Mid Graft     28       biphasic          +------------------+--------+--------+----------+--------------+  Distal Graft   50       biphasic          +------------------+--------+--------+----------+--------------+  Distal Anastomosis86       biphasic          +------------------+--------+--------+----------+--------------+  Outflow      10       monophasicCFA contracted  +------------------+--------+--------+----------+--------------+   Small contracted femoral artery with minmal to no flow.   Left Graft #1:  +--------------------+--------+--------+----------+--------+            PSV cm/sStenosisWaveform Comments  +--------------------+--------+--------+----------+--------+  Inflow       44       triphasic       +--------------------+--------+--------+----------+--------+  Proximal Anastomosis                   +--------------------+--------+--------+----------+--------+  Proximal Graft   61       triphasic       +--------------------+--------+--------+----------+--------+  Mid Graft      47       biphasic       +--------------------+--------+--------+----------+--------+  Distal Graft     81       biphasic       +--------------------+--------+--------+----------+--------+  Distal Anastamosis 81       biphasic       +--------------------+--------+--------+----------+--------+  Outflow       147       monophasic      +--------------------+--------+--------+----------+--------+   Left CFA 110 cm/s biphasic waveform. Left profunda 60 cm/s biphasic  waveform. Left SFA 94 cm/s biphasic waveform.   Summary:  Patent aorto-bifemoral bypass graft with no visualized stenosis. Known right femoral artery occlusion disease per aortogram 01/10/2020.The left common femoral / profunda artery appears patent.    ASSESSMENT/PLAN:: 74 y.o. female here for follow up for follow up of her peripheral arterial disease.  She has history of aortobifemoral bypass performed by Dr. Trula Slade 05/16/11 for iliac occlusion and claudication. She more recently is s/p left common femoral, profunda, and SFA endarterectomy of her left leg with patch angioplasty on 01/27/20 by Dr. Trula Slade for claudication and rest pain. She still has some neuropathic pain in left thigh. I reassured her that this may improve with time. This is not disabling to her. She otherwise does not have any claudication, rest pain or non healing wounds. Her aortoiliac duplex today shows patent aortobifemoral bypass. Her CFA on the right is diminished but she does have known SFA occlusion. She has a palpable femoral pulse on exam.  - She will continue her Aspirin, statin and Plavix - Advised her to follow up sooner if she develops new or worsening symptoms - I will have her follow up in 6 months with repeat aortoiliac duplex and ABI's   Karoline Caldwell, PA-C [facility] [phone]  On call MD:   Carlis Abbott

## 2020-06-06 ENCOUNTER — Other Ambulatory Visit: Payer: Self-pay | Admitting: *Deleted

## 2020-06-06 DIAGNOSIS — I739 Peripheral vascular disease, unspecified: Secondary | ICD-10-CM

## 2020-06-06 DIAGNOSIS — I70213 Atherosclerosis of native arteries of extremities with intermittent claudication, bilateral legs: Secondary | ICD-10-CM

## 2020-06-25 ENCOUNTER — Other Ambulatory Visit: Payer: Self-pay

## 2020-08-14 DIAGNOSIS — H25012 Cortical age-related cataract, left eye: Secondary | ICD-10-CM | POA: Diagnosis not present

## 2020-08-14 DIAGNOSIS — Z961 Presence of intraocular lens: Secondary | ICD-10-CM | POA: Diagnosis not present

## 2020-08-14 DIAGNOSIS — H2512 Age-related nuclear cataract, left eye: Secondary | ICD-10-CM | POA: Diagnosis not present

## 2020-08-14 DIAGNOSIS — H35033 Hypertensive retinopathy, bilateral: Secondary | ICD-10-CM | POA: Diagnosis not present

## 2020-08-14 DIAGNOSIS — H26491 Other secondary cataract, right eye: Secondary | ICD-10-CM | POA: Diagnosis not present

## 2020-08-14 LAB — HM DIABETES EYE EXAM

## 2020-08-22 DIAGNOSIS — H25012 Cortical age-related cataract, left eye: Secondary | ICD-10-CM | POA: Diagnosis not present

## 2020-08-22 DIAGNOSIS — H2512 Age-related nuclear cataract, left eye: Secondary | ICD-10-CM | POA: Diagnosis not present

## 2020-08-22 DIAGNOSIS — H25812 Combined forms of age-related cataract, left eye: Secondary | ICD-10-CM | POA: Diagnosis not present

## 2020-09-11 DIAGNOSIS — C678 Malignant neoplasm of overlapping sites of bladder: Secondary | ICD-10-CM | POA: Diagnosis not present

## 2020-09-24 ENCOUNTER — Other Ambulatory Visit: Payer: Self-pay | Admitting: Family Medicine

## 2020-09-24 DIAGNOSIS — I10 Essential (primary) hypertension: Secondary | ICD-10-CM

## 2020-12-22 ENCOUNTER — Other Ambulatory Visit: Payer: Self-pay | Admitting: Family Medicine

## 2020-12-22 DIAGNOSIS — I10 Essential (primary) hypertension: Secondary | ICD-10-CM

## 2021-01-11 DIAGNOSIS — Z1231 Encounter for screening mammogram for malignant neoplasm of breast: Secondary | ICD-10-CM | POA: Diagnosis not present

## 2021-01-11 LAB — HM MAMMOGRAPHY

## 2021-03-04 DIAGNOSIS — C678 Malignant neoplasm of overlapping sites of bladder: Secondary | ICD-10-CM | POA: Diagnosis not present

## 2021-03-07 ENCOUNTER — Other Ambulatory Visit: Payer: Self-pay

## 2021-03-07 ENCOUNTER — Ambulatory Visit (INDEPENDENT_AMBULATORY_CARE_PROVIDER_SITE_OTHER): Payer: Medicare HMO | Admitting: Family Medicine

## 2021-03-07 ENCOUNTER — Encounter: Payer: Self-pay | Admitting: Family Medicine

## 2021-03-07 VITALS — BP 96/62 | HR 79 | Temp 97.8°F | Ht 61.0 in | Wt 126.2 lb

## 2021-03-07 DIAGNOSIS — K5790 Diverticulosis of intestine, part unspecified, without perforation or abscess without bleeding: Secondary | ICD-10-CM

## 2021-03-07 DIAGNOSIS — Z9841 Cataract extraction status, right eye: Secondary | ICD-10-CM | POA: Diagnosis not present

## 2021-03-07 DIAGNOSIS — Z0000 Encounter for general adult medical examination without abnormal findings: Secondary | ICD-10-CM

## 2021-03-07 DIAGNOSIS — D509 Iron deficiency anemia, unspecified: Secondary | ICD-10-CM

## 2021-03-07 DIAGNOSIS — J301 Allergic rhinitis due to pollen: Secondary | ICD-10-CM | POA: Diagnosis not present

## 2021-03-07 DIAGNOSIS — I10 Essential (primary) hypertension: Secondary | ICD-10-CM | POA: Diagnosis not present

## 2021-03-07 DIAGNOSIS — I6523 Occlusion and stenosis of bilateral carotid arteries: Secondary | ICD-10-CM | POA: Diagnosis not present

## 2021-03-07 DIAGNOSIS — K31819 Angiodysplasia of stomach and duodenum without bleeding: Secondary | ICD-10-CM

## 2021-03-07 DIAGNOSIS — Z95828 Presence of other vascular implants and grafts: Secondary | ICD-10-CM | POA: Diagnosis not present

## 2021-03-07 DIAGNOSIS — M8580 Other specified disorders of bone density and structure, unspecified site: Secondary | ICD-10-CM

## 2021-03-07 DIAGNOSIS — I739 Peripheral vascular disease, unspecified: Secondary | ICD-10-CM | POA: Diagnosis not present

## 2021-03-07 DIAGNOSIS — I70213 Atherosclerosis of native arteries of extremities with intermittent claudication, bilateral legs: Secondary | ICD-10-CM

## 2021-03-07 DIAGNOSIS — Z853 Personal history of malignant neoplasm of breast: Secondary | ICD-10-CM | POA: Diagnosis not present

## 2021-03-07 DIAGNOSIS — Z23 Encounter for immunization: Secondary | ICD-10-CM

## 2021-03-07 DIAGNOSIS — C679 Malignant neoplasm of bladder, unspecified: Secondary | ICD-10-CM

## 2021-03-07 DIAGNOSIS — Z9842 Cataract extraction status, left eye: Secondary | ICD-10-CM

## 2021-03-07 DIAGNOSIS — Z720 Tobacco use: Secondary | ICD-10-CM

## 2021-03-07 MED ORDER — HYDROCHLOROTHIAZIDE 25 MG PO TABS: 1.0000 | ORAL_TABLET | Freq: Every day | ORAL | 3 refills | Status: DC

## 2021-03-07 MED ORDER — ATORVASTATIN CALCIUM 20 MG PO TABS: 1.0000 | ORAL_TABLET | Freq: Every day | ORAL | 3 refills | Status: DC

## 2021-03-07 NOTE — Patient Instructions (Addendum)
  Ms. Gadea , Thank you for taking time to come for your Medicare Wellness Visit. I appreciate your ongoing commitment to your health goals. Please review the following plan we discussed and let me know if I can assist you in the future.   These are the goals we discussed:   This is a list of the screening recommended for you and due dates:  Health Maintenance  Topic Date Due  . Pneumonia vaccines (2 of 2 - PCV13) 02/19/2018  . COVID-19 Vaccine (3 - Pfizer risk 4-dose series) 08/19/2021*  . Flu Shot  06/17/2021  . Mammogram  01/11/2023  . Tetanus Vaccine  02/04/2028  . Colon Cancer Screening  05/23/2029  . DEXA scan (bone density measurement)  Completed  .  Hepatitis C: One time screening is recommended by Center for Disease Control  (CDC) for  adults born from [birth year] through 1964.   Completed  . HPV Vaccine  Aged Out  *Topic was postponed. The date shown is not the original due date.  Jocelyn Lamer did get into

## 2021-03-07 NOTE — Progress Notes (Signed)
[name] is a 75 y.o. female who presents for annual wellness visit,CPE and follow-up on chronic medical conditions.  She has no particular concerns or complaints.  She does have underlying PAD and has a history of aortofemoral bypass.  She gets regular follow-up concerning that.  She also has a history of carotid stenosis or recent testing was done and the results were reviewed.  She has had her cataracts removed and is doing well with that.  She also has a history of diverticulosis and tubular adenoma.  She has been seen by [name] with a colonoscopy within the last several years.  She also has a history of gastric AVM and has had an ablation of that.  She has a history of anemia secondary to above. On she follows up regularly urology for her underlying bladder cancer she has another appointment scheduled later this fall.  She continues to smoke and is not ready to quit entirely.  Continues on her blood pressure medications.  Review of the record indicates she does occasionally have difficulty with depression but states that she recovers from that very quickly and has to do a lot with her underlying medical conditions.  Immunizations and Health Maintenance Immunization History  Administered Date(s) Administered  . Fluad Quad(high Dose 65+) 09/22/2019  . Influenza Split 11/02/2014  . Influenza, High Dose Seasonal PF 11/29/2015, 01/18/2018  . Influenza-Unspecified 11/24/2018  . PFIZER(Purple Top)SARS-COV-2 Vaccination 02/17/2020, 03/12/2020  . Pneumococcal Polysaccharide-23 02/19/2017  . Tdap 02/03/2018   Health Maintenance Due  Topic Date Due  . PNA vac Low Risk Adult (2 of 2 - PCV13) 02/19/2018    Last Pap smear: aged out  Last mammogram: 01/11/21 Last colonoscopy: 05/24/2019 Last DEXA: 12/11/15 Dentist: Q six months Ophtho: Q year Exercise: N/A  Other doctors caring for patient include: [name] oncology, [name] GI  Advanced directives: Does Patient Have a Medical Advance  Directive?: No Would patient like information on creating a medical advance directive?: Yes (ED - Information included in AVS)  Depression screen:  See questionnaire below.  Depression screen [facility] 2/9 03/07/2021 05/25/2020 02/19/2017 11/29/2015  Decreased Interest 0 0 0 1  Down, Depressed, Hopeless 1 1 0 0  PHQ - 2 Score 1 1 0 1    Fall Risk Screen: see questionnaire below. Fall Risk  03/07/2021 05/25/2020 02/19/2017 11/29/2015  Falls in the past year? 0 0 No No  Number falls in past yr: 0 - - -  Injury with Fall? 0 - - -  Risk for fall due to : No Fall Risks - - -  Follow up Falls evaluation completed - - -    ADL screen:  See questionnaire below Functional Status Survey: Is the patient deaf or have difficulty hearing?: No Does the patient have difficulty seeing, even when wearing glasses/contacts?: No Does the patient have difficulty concentrating, remembering, or making decisions?: No Does the patient have difficulty walking or climbing stairs?: Yes (dizzy sometimes) Does the patient have difficulty dressing or bathing?: No Does the patient have difficulty doing errands alone such as visiting a doctor's office or shopping?: No   Review of Systems Constitutional: -, -unexpected weight change, -anorexia, -fatigue Allergy: -sneezing, -itching, -congestion Dermatology: denies changing moles, rash, lumps ENT: -runny nose, -ear pain, -sore throat,  Cardiology:  -chest pain, -palpitations, -orthopnea, Respiratory: -cough, -shortness of breath, -dyspnea on exertion, -wheezing,  Gastroenterology: -abdominal pain, -nausea, -vomiting, -diarrhea, -constipation, -dysphagia Hematology: -bleeding or bruising problems Musculoskeletal: -arthralgias, -myalgias, -joint swelling, -back pain, -  Ophthalmology: -vision changes,  Urology: -dysuria, -difficulty urinating,  -urinary frequency, -urgency, incontinence Neurology: -, -numbness, , -memory loss, -falls, -dizziness    PHYSICAL EXAM:  General  Appearance: Alert, cooperative, no distress, appears stated age Head: Normocephalic, without obvious abnormality, atraumatic Eyes: PERRL, conjunctiva/corneas clear, EOM's intact,  Ears: Normal TM's and external ear canals Nose: Nares normal, mucosa normal, no drainage or sinus tenderness Throat: Lips, mucosa, and tongue normal; teeth and gums normal Neck: Supple, no lymphadenopathy;  thyroid:  no enlargement/tenderness/nodules; no carotid bruit or JVD Lungs: Clear to auscultation bilaterally without wheezes, rales or ronchi; respirations unlabored Heart: Regular rate and rhythm, S1 and S2 normal, no murmur, rubor gallop Abdomen: Soft, non-tender, nondistended, normoactive bowel sounds,  no masses, no hepatosplenomegaly Extremities: No clubbing, cyanosis or edema Skin:  Skin color, texture, turgor normal, no rashes or lesions Lymph nodes: Cervical, supraclavicular, and axillary nodes normal Neurologic:  CNII-XII intact, normal strength, sensation and gait; reflexes 2+ and symmetric throughout Psych: Normal mood, affect, hygiene and grooming.  ASSESSMENT/PLAN: Iron deficiency anemia, unspecified iron deficiency anemia type - Plan: CBC with Differential/Platelet, Comprehensive metabolic panel  Gastric AVM - Plan: CBC with Differential/Platelet, Comprehensive metabolic panel  PVD (peripheral vascular disease) ([place]) - Plan: atorvastatin (LIPITOR) 20 MG tablet  Asymptomatic bilateral carotid artery stenosis - Plan: Lipid panel  History of aorto-femoral bypass  Malignant neoplasm of urinary bladder, unspecified site (HCC)  History of bilateral cataract extraction  Angiodysplasia of stomach  History of breast cancer  Allergic rhinitis due to pollen, unspecified seasonality  Tobacco abuse  Primary hypertension - Plan: CBC with Differential/Platelet, Comprehensive metabolic panel  Diverticulosis  PAD (peripheral artery disease) ([place]) - Plan: Lipid panel  Osteopenia, unspecified  location - Plan: DG Bone Density  Need for vaccination against Streptococcus pneumoniae - Plan: Pneumococcal conjugate vaccine 13-valent IM  Atherosclerosis of native artery of both lower extremities with intermittent claudication (HCC) - Plan: atorvastatin (LIPITOR) 20 MG tablet  Essential hypertension - Plan: hydrochlorothiazide (HYDRODIURIL) 25 MG tablet Continue on present medications.  Encouraged her to quit smoking but again she is not interested dare not.  Recommend she get further COVID vaccination and Shingrix shot.  If she has difficulty psychologically, encouraged her to call me for further intervention.  She was comfortable with that.  Immunization recommendations discussed.  Colonoscopy recommendations reviewed   Medicare Attestation I have personally reviewed: The patient's medical and social history Their use of alcohol, tobacco or illicit drugs Their current medications and supplements The patient's functional ability including ADLs,fall risks, home safety risks, cognitive, and hearing and visual impairment Diet and physical activities Evidence for depression or mood disorders  The patient's weight, height, and BMI have been recorded in the chart.  I have made referrals, counseling, and provided education to the patient based on review of the above and I have provided the patient with a written personalized care plan for preventive services.     Jill Alexanders, MD   03/07/2021

## 2021-03-08 LAB — CBC WITH DIFFERENTIAL/PLATELET
Basophils Absolute: 0.1 10*3/uL (ref 0.0–0.2)
Basos: 1 %
EOS (ABSOLUTE): 0.2 10*3/uL (ref 0.0–0.4)
Eos: 4 %
Hematocrit: 41.9 % (ref 34.0–46.6)
Hemoglobin: 13.6 g/dL (ref 11.1–15.9)
Immature Grans (Abs): 0 10*3/uL (ref 0.0–0.1)
Immature Granulocytes: 0 %
Lymphocytes Absolute: 2.6 10*3/uL (ref 0.7–3.1)
Lymphs: 45 %
MCH: 29 pg (ref 26.6–33.0)
MCHC: 32.5 g/dL (ref 31.5–35.7)
MCV: 89 fL (ref 79–97)
Monocytes Absolute: 0.4 10*3/uL (ref 0.1–0.9)
Monocytes: 7 %
Neutrophils Absolute: 2.4 10*3/uL (ref 1.4–7.0)
Neutrophils: 43 %
Platelets: 204 10*3/uL (ref 150–450)
RBC: 4.69 x10E6/uL (ref 3.77–5.28)
RDW: 13.1 % (ref 11.7–15.4)
WBC: 5.7 10*3/uL (ref 3.4–10.8)

## 2021-03-08 LAB — COMPREHENSIVE METABOLIC PANEL
ALT: 19 IU/L (ref 0–32)
AST: 27 IU/L (ref 0–40)
Albumin/Globulin Ratio: 1.2 (ref 1.2–2.2)
Albumin: 4.3 g/dL (ref 3.7–4.7)
Alkaline Phosphatase: 126 IU/L — ABNORMAL HIGH (ref 44–121)
BUN/Creatinine Ratio: 11 — ABNORMAL LOW (ref 12–28)
BUN: 9 mg/dL (ref 8–27)
Bilirubin Total: 0.3 mg/dL (ref 0.0–1.2)
CO2: 26 mmol/L (ref 20–29)
Calcium: 10.1 mg/dL (ref 8.7–10.3)
Chloride: 98 mmol/L (ref 96–106)
Creatinine, Ser: 0.8 mg/dL (ref 0.57–1.00)
Globulin, Total: 3.5 g/dL (ref 1.5–4.5)
Glucose: 87 mg/dL (ref 65–99)
Potassium: 4.4 mmol/L (ref 3.5–5.2)
Sodium: 142 mmol/L (ref 134–144)
Total Protein: 7.8 g/dL (ref 6.0–8.5)
eGFR: 77 mL/min/{1.73_m2} (ref >59)

## 2021-03-08 LAB — LIPID PANEL
Chol/HDL Ratio: 2.3 ratio (ref 0.0–4.4)
Cholesterol, Total: 172 mg/dL (ref 100–199)
HDL: 75 mg/dL (ref >39)
LDL Chol Calc (NIH): 77 mg/dL (ref 0–99)
Triglycerides: 113 mg/dL (ref 0–149)
VLDL Cholesterol Cal: 20 mg/dL (ref 5–40)

## 2021-03-21 ENCOUNTER — Ambulatory Visit (INDEPENDENT_AMBULATORY_CARE_PROVIDER_SITE_OTHER): Payer: Medicare HMO

## 2021-03-21 ENCOUNTER — Other Ambulatory Visit: Payer: Self-pay

## 2021-03-21 DIAGNOSIS — Z23 Encounter for immunization: Secondary | ICD-10-CM

## 2021-03-28 DIAGNOSIS — Z961 Presence of intraocular lens: Secondary | ICD-10-CM | POA: Diagnosis not present

## 2021-03-28 DIAGNOSIS — H26491 Other secondary cataract, right eye: Secondary | ICD-10-CM | POA: Diagnosis not present

## 2021-03-28 DIAGNOSIS — H1045 Other chronic allergic conjunctivitis: Secondary | ICD-10-CM | POA: Diagnosis not present

## 2021-03-28 DIAGNOSIS — H35033 Hypertensive retinopathy, bilateral: Secondary | ICD-10-CM | POA: Diagnosis not present

## 2021-03-28 LAB — HM DIABETES EYE EXAM

## 2021-04-05 ENCOUNTER — Telehealth: Payer: Self-pay

## 2021-04-05 ENCOUNTER — Encounter: Payer: Self-pay | Admitting: Medical

## 2021-04-05 ENCOUNTER — Ambulatory Visit (INDEPENDENT_AMBULATORY_CARE_PROVIDER_SITE_OTHER): Payer: Medicare HMO | Admitting: Medical

## 2021-04-05 ENCOUNTER — Other Ambulatory Visit: Payer: Self-pay

## 2021-04-05 VITALS — BP 110/60 | HR 79 | Ht 61.0 in | Wt 126.8 lb

## 2021-04-05 DIAGNOSIS — R21 Rash and other nonspecific skin eruption: Secondary | ICD-10-CM

## 2021-04-05 DIAGNOSIS — T7840XA Allergy, unspecified, initial encounter: Secondary | ICD-10-CM

## 2021-04-05 MED ORDER — HYDROXYZINE HCL 10 MG PO TABS
10.0000 mg | ORAL_TABLET | Freq: Three times a day (TID) | ORAL | 0 refills | Status: DC | PRN
Start: 2021-04-05 — End: 2022-06-23

## 2021-04-05 MED ORDER — PREDNISONE 20 MG PO TABS
40.0000 mg | ORAL_TABLET | Freq: Every day | ORAL | 0 refills | Status: DC
Start: 2021-04-05 — End: 2021-07-12

## 2021-04-05 MED ORDER — TRIAMCINOLONE ACETONIDE 0.1 % EX CREA
1.0000 | TOPICAL_CREAM | Freq: Two times a day (BID) | CUTANEOUS | 0 refills | Status: AC
Start: 2021-04-05 — End: ?

## 2021-04-05 NOTE — Telephone Encounter (Signed)
Pt. Called stating she is broke again in a rash on her face, neck, ear, and lower arm. She wanted to know if you had any recommendations for anything OTC, or in the past she had to have a prescription called in.

## 2021-04-05 NOTE — Telephone Encounter (Signed)
Pt. Scheduled for an apt. This afternoon with Audelia Acton.

## 2021-04-05 NOTE — Patient Instructions (Signed)
Please call to schedule your bone density test  [facility]  [phone] [address] [place], [place] [address]   Recommendations for rash/itching  You can use hydroxyzine 10 mg tablets 2 or 3 times daily for itching and rash over the next few days.  I sent this as a prescription   you can use hydrocortisone cream over-the-counter for facial rash over the next few days or Cetaphil or Aquaphor lotion as options  For anywhere else on the body such as her neck, your arms or other you can use the triamcinolone prescription steroid cream I sent to your pharmacy today  Begin prednisone steroid medication by mouth 2 tablets daily for 4 days which will also help with the rash and itching and allergic reaction  If this type of rash and itching continues to happen, ultimately you may need to go back and see an allergist   If you are not much improved by Monday then call back

## 2021-04-05 NOTE — Progress Notes (Signed)
Tuesday started with rash  Subjective:  [name] is a 75 y.o. female who presents for Chief Complaint  Patient presents with  . Rash    Face,arm and neck rash      Here for rash on chin, neck and face, few areas on the arms and hands.  She has gotten this from time to time in recent years with no particular trigger.  She has been working on her garden which she does regularly and has also not had any new exposures.  Rash is quite itchy.  No fever, no other symptoms of concern.  She states that she saw an allergist in the past and nothing was discovered that was a specific trigger.  No other aggravating or relieving factors.    No other c/o.  The following portions of the patient's history were reviewed and updated as appropriate: allergies, current medications, past family history, past medical history, past social history, past surgical history and problem list.  ROS Otherwise as in subjective above  Objective: BP 110/60   Pulse 79   Ht 5\' 1"  (1.549 m)   Wt 126 lb 12.8 oz (57.5 kg)   SpO2 95%   BMI 23.96 kg/m   General appearance: alert, no distress, well developed, well nourished There is some pinkish irritated skin of her chin and anterior neck, there is a linear erythematous flat area on her right volar wrist, there is some faint pinkish-red coloration on her upper chest, all nonspecific Otherwise well-appearing    Assessment: Encounter Diagnoses  Name Primary?  . Rash Yes  . Allergic reaction, initial encounter      Plan: We discussed her symptoms and exam findings.  I suspect some type of contact dermatitis.  We discussed the recommendations below regarding cream and oral medication  She was not sure about how to schedule her bone density test ordered by her PCP.  I gave her the instructions as the test order is already in the system  We discussed the recommendations below  Patient Instructions  Please call to schedule your bone density test  [facility] [address], [place], [place] [address]   Recommendations for rash/itching  You can use hydroxyzine 10 mg tablets 2 or 3 times daily for itching and rash over the next few days.  I sent this as a prescription   you can use hydrocortisone cream over-the-counter for facial rash over the next few days or Cetaphil or Aquaphor lotion as options  For anywhere else on the body such as her neck, your arms or other you can use the triamcinolone prescription steroid cream I sent to your pharmacy today  Begin prednisone steroid medication by mouth 2 tablets daily for 4 days which will also help with the rash and itching and allergic reaction  If this type of rash and itching continues to happen, ultimately you may need to go back and see an allergist   If you are not much improved by Monday then call back    [name] was seen today for rash.  Diagnoses and all orders for this visit:  Rash  Allergic reaction, initial encounter  Other orders -     predniSONE (DELTASONE) 20 MG tablet; Take 2 tablets (40 mg total) by mouth daily. -     triamcinolone cream (KENALOG) 0.1 %; Apply 1 application topically 2 (two) times daily. -     hydrOXYzine (ATARAX/VISTARIL) 10 MG tablet; Take 1 tablet (  10 mg total) by mouth 3 (three) times daily as needed. For itching    Follow up: As needed

## 2021-04-05 NOTE — Telephone Encounter (Signed)
I do not see a note so not sure what she is referring to.  Have her set up an appointment

## 2021-04-17 ENCOUNTER — Other Ambulatory Visit: Payer: Self-pay

## 2021-04-18 ENCOUNTER — Other Ambulatory Visit: Payer: Self-pay

## 2021-04-18 NOTE — Patient Outreach (Addendum)
[name]) Care Management  04/18/2021  [name] June 15, 1946 [id number]    Telephone Screen  Referral Date:04/17/2021 Referral Source:Join EMMI Referral Reason:"HTN, breast & bladder CA" Insurance:[employer]  Initial Assessment  Incoming call from patient returning RN CM call. Spoke with patient and discussed [facility] services. Patient a little hesitant as she states she called MD office and spoke with someone and was told they did not send over a referral for her to [facility]. RN CM explained EMMI call and that she randomly received call explaining services and that [facility] works in partnership with PCP office.   Social: Patient resides in the home along with her spouse and adult son. She states she is independent with ADLs/IADLs except she no longer drives due to visual issues. She denies any recent falls. Patient reports that she has a lot of family/social stressors and issues. She attributes that to her "chain smoking." She reports that she needs to seek mental health/counseling and support to talk with someone. She is agreeable to SW referral for possible assistance and resources.   Conditions: Per chart review, patient has PMH that includes but not limited to HTN, breast CA, PAD, carotid stenosis, bladder CA and smoker. Patient  reports she smokes about half a pack to whole pack a day of cigarettes. She has tried multiple methods to quit without success and currently not interested in quitting.   Medications Reviewed Today    Reviewed by Julie Pedro, RN (Registered Nurse) on 04/18/21 at 1532  Med List Status: <None>  Medication Order Taking? Sig Documenting Provider Last Dose Status Informant  acetaminophen (TYLENOL) 500 MG tablet [id number] Yes Take 1,000 mg by mouth every 6 (six) hours as needed for mild pain or moderate pain. [provider] Taking Active Self  aspirin EC 81 MG tablet [id number] Yes Take 81 mg by mouth every evening.   [provider] Taking Active Self           Med Note Tamala Julian, [name] Sep 22, 2019 11:24 AM)    atorvastatin (LIPITOR) 20 MG tablet [id number] Yes Take 1 tablet (20 mg total) by mouth daily. Julie Lung, MD Taking Active   Camphor-Eucalyptus-Menthol [facility] VAPORUB) 4.73-1.2-2.6 % Kerby Moors [id number] Yes Apply 1 application topically 3 (three) times daily as needed (leg discomfort). [provider] Taking Active Self  Cholecalciferol (VITAMIN D) 50 MCG (2000 UT) tablet [id number] Yes Take 2,000 Units by mouth every evening.  [provider] Taking Active Self  clopidogrel (PLAVIX) 75 MG tablet [id number] Yes TAKE 1 TABLET BY MOUTH EVERY DAY. RESUME ONCE YOU NO LONGER HAVE ANY BLEEDING IN YOUR URINE OR STOOL AND HAVE COMPLETED ALL SCHEDULED PROCEDURES Julie Lung, MD Taking Active   diphenhydrAMINE HCl (BENADRYL ITCH STOPPING EX) [id number] Yes Apply 1 application topically 4 (four) times daily as needed (skin rash/irritation.). [provider] Taking Active Self  docusate sodium (COLACE) 100 MG capsule [id number]  Take 100 mg by mouth 2 (two) times daily. [provider]  Active Self  hydrochlorothiazide (HYDRODIURIL) 25 MG tablet [id number] Yes Take 1 tablet (25 mg total) by mouth daily. Julie Lung, MD Taking Active   hydroxypropyl methylcellulose / hypromellose (ISOPTO TEARS / GONIOVISC) 2.5 % ophthalmic solution [id number] Yes Place 1 drop into both eyes 4 (four) times daily as needed for dry eyes.  [provider] Taking Active Self  hydrOXYzine (ATARAX/VISTARIL) 10 MG tablet [id number] Yes Take 1 tablet (10 mg total) by mouth  3 (three) times daily as needed. For itching [name], Julie Eng, PA-C Taking Active   Multiple Vitamin (MULTIVITAMIN) LIQD [id number] Yes Take 15 mLs by mouth 2 (two) times a week. Nature's Plus Source of Life Gold Liquid [provider] Taking Active Self  oxyCODONE (OXY IR/ROXICODONE) 5 MG immediate release  tablet [id number] No Take 1 tablet (5 mg total) by mouth every 6 (six) hours as needed for moderate pain.  Patient not taking: No sig reported   [name], [place] Not Taking Active   polyethylene glycol (MIRALAX / GLYCOLAX) 17 g packet [id number] Yes Take 17 g by mouth daily. [provider] Taking Active Self        Discontinued 01/27/12 1412 (Error)   predniSONE (DELTASONE) 20 MG tablet [id number] No Take 2 tablets (40 mg total) by mouth daily.  Patient not taking: Reported on 04/18/2021   Julie Hurl, PA-C Not Taking Active   senna (SENOKOT) 8.6 MG tablet [id number] Yes Take 1 tablet by mouth daily.  [provider] Taking Active Self  simethicone (MYLICON) 353 MG chewable tablet [id number] Yes Chew 125 mg by mouth every 6 (six) hours as needed for flatulence. [provider] Taking Active Self  triamcinolone cream (KENALOG) 0.1 % [id number] Yes Apply 1 application topically 2 (two) times daily. Julie Hurl, PA-C Taking Active            Fall Risk  04/18/2021 03/07/2021 05/25/2020 02/19/2017 11/29/2015  Falls in the past year? 0 0 0 No No  Number falls in past yr: 0 0 - - -  Injury with Fall? 0 0 - - -  Risk for fall due to : Medication side effect No Fall Risks - - -  Follow up Falls evaluation completed;Education provided Falls evaluation completed - - -    Depression screen [facility] 2/9 04/18/2021 03/07/2021 05/25/2020 02/19/2017 11/29/2015  Decreased Interest 0 0 0 0 1  Down, Depressed, Hopeless 0 1 1 0 0  PHQ - 2 Score 0 1 1 0 1    SDOH Screenings   Alcohol Screen: Not on file  Depression (PHQ2-9): Low Risk   . PHQ-2 Score: 0  Financial Resource Strain: Not on file  Food Insecurity: No Food Insecurity  . Worried About Charity fundraiser in the Last Year: Never true  . Ran Out of Food in the Last Year: Never true  Housing: Not on file  Physical Activity: Not on file  Social Connections: Not on file  Stress: Not on file  Tobacco Use: High Risk  .  Smoking Tobacco Use: Current Some Day Smoker  . Smokeless Tobacco Use: Never Used  Transportation Needs: No Transportation Needs  . Lack of Transportation (Medical): No  . Lack of Transportation (Non-Medical): No    Goals Addressed              This Visit's Progress   .  Adequate Coping and Stress Mgmt (pt-stated)          Timeframe:  Long-Range Goal Priority:  High Start Date:  04/18/2021                           Expected End Date:  Aug 2022 Follow Up Date: July 2022   Barriers: Health Behaviors Knowledge                       -pt will verbalize at least 2-3 ways to manage  stress -pt will seek mental health/counseling resources/assistance   Notes: 04/18/2021-Patient admits to family stressors that causes her to chain smoke. She is requesting some counseling/mental health resources and referral made to [facility] also encouraged to follow up and discus with PCP.    .  Track and Manage My Blood Pressure-Hypertension (pt-stated)        Timeframe:  Long-Range Goal Priority:  High Start Date:   04/18/2021                          Expected End Date: Sept. 2022                      Follow Up Date July 2022   Barriers: Health Behaviors Knowledge  - check blood pressure weekly - write blood pressure results in a log or diary    Why is this important?    You won't feel high blood pressure, but it can still hurt your blood vessels.   High blood pressure can cause heart or kidney problems. It can also cause a stroke.   Making lifestyle changes like losing a little weight or eating less salt will help.   Checking your blood pressure at home and at different times of the day can help to control blood pressure.   If the doctor prescribes medicine remember to take it the way the doctor ordered.   Call the office if you cannot afford the medicine or if there are questions about it.     Notes:   04/18/2021-Patient admits that she has BP machine in the home but does  not check BP. Educated on importance of BP monitoring given taking BP meds and normal BP parameters. RN CM encouraged patient to begin checking BP at least  1-2x  a week. Patient reports adherence to low salt diet.       Consent: [facility] services reviewed and discussed with patient. Verbal consent for services given.  Plan: RN CM discussed with patient next outreach within the month of July. Patient gave verbal consent and in agreement with RN CM follow up and timeframe. Patient aware that they may contact RN CM sooner for any issues or concerns. RN CM reviewed goals and plan of care with patient. Patient agrees to care plan and follow up. RN CM will send barriers letter and route encounter to PCP. RN CM will send welcome letter to patient. RN CM completed referral to [facility] SW for counseling resources.   Enzo Montgomery, RN,BSN,CCM [place] Management Telephonic Care Management Coordinator Direct Phone: [phone] Toll Free: [phone] Fax: [phone]

## 2021-04-18 NOTE — Patient Outreach (Signed)
[place] [facility]) Care Management  04/18/2021  [name] 1946-01-02 [id number]   Telephone Screen  Referral Date: 04/17/2021 Referral Source: Join EMMI Referral Reason: "HTN, breast & bladder CA" Insurance: [facility]   Outreach attempt # 1 to patient. No answer. RN CM left HIPAA compliant voicemail message along with contact info.     Plan: RN CM will make outreach attempt to patient within 3-4 business days. RN CM will send unsuccessful outreach letter to patient.   Enzo Montgomery, RN,BSN,CCM [place] Management Telephonic Care Management Coordinator Direct Phone: [phone] Toll Free: [phone] Fax: [phone]

## 2021-04-18 NOTE — Patient Outreach (Signed)
[facility]) Care Management  04/18/2021  [name] Nov 11, 1946 [id number]   Telephone Screen  Referral Date: 04/17/2021 Referral Source: Join EMMI Referral Reason: "HTN, breast & bladder CA" Insurance: [facility]  Incoming call from patient's spouse returning call. RN CM identified self and asked to speak with patient. He abruptly stated "how did you get this number and whatever you are calling about-not interested." RN CM explained nature of call without divulging personal medical info and that number listed as main number to reach patient on. He then proceeded to stat that if I wanted to talk to patient to call house phone number. RN CM made outreach attempt to alternate number/house number listed for patient. No answer at present and HIPAA compliant voicemail message left.     Plan: RN CM will make outreach attempt to patient within 3-4 business days.  Julie Montgomery, RN,BSN,CCM [place] Management Telephonic Care Management Coordinator Direct Phone: [phone] Toll Free: [phone] Fax: [phone]

## 2021-04-19 ENCOUNTER — Other Ambulatory Visit: Payer: Medicare HMO | Admitting: *Deleted

## 2021-04-19 ENCOUNTER — Encounter: Payer: Self-pay | Admitting: *Deleted

## 2021-04-19 NOTE — Patient Outreach (Signed)
[facility]) Care Management  04/19/2021  [name] 1946/04/21 [id number]   LCSW made an initial attempt to try and contact patient today to perform the phone assessment, as well as assess and assist with social work needs and services; however, patient was unavailable at the time of LCSW's call.  LCSW left a HIPAA compliant message on voicemail for patient and is currently awaiting a return call.  LCSW will make a second outreach attempt within the next week, if a return call is not received from patient in the meantime.  Contact information for LCSW was left on voicemail for patient.  Nat Christen, BSW, MSW, LCSW  Licensed Education officer, [facility]  Mailing [place] [address] [place], [place] [address] Physical Address[address], [place], [place] [address] Toll Free Main # [phone] Fax # [phone] Cell # [phone]  [name]

## 2021-04-22 ENCOUNTER — Ambulatory Visit: Payer: Medicare HMO

## 2021-04-23 ENCOUNTER — Encounter: Payer: Self-pay | Admitting: *Deleted

## 2021-04-23 ENCOUNTER — Other Ambulatory Visit: Payer: Self-pay | Admitting: *Deleted

## 2021-04-23 NOTE — Patient Outreach (Signed)
[facility]) Care Management  [facility] Social Work  04/23/2021  [name] 26-Jul-1946 [id number]   Patient with Acute Mental Health needs, related to Depression and Family Stressors, would benefit from social work services and resources to assist with counseling and supportive services, and patient is agreeable.  Encounter Medications:  Outpatient Encounter Medications as of 04/23/2021  Medication Sig  . acetaminophen (TYLENOL) 500 MG tablet Take 1,000 mg by mouth every 6 (six) hours as needed for mild pain or moderate pain.  [name] aspirin EC 81 MG tablet Take 81 mg by mouth every evening.   [name] atorvastatin (LIPITOR) 20 MG tablet Take 1 tablet (20 mg total) by mouth daily.  . Camphor-Eucalyptus-Menthol (VICKS VAPORUB) 4.73-1.2-2.6 % OINT Apply 1 application topically 3 (three) times daily as needed (leg discomfort).  . Cholecalciferol (VITAMIN D) 50 MCG (2000 UT) tablet Take 2,000 Units by mouth every evening.   . clopidogrel (PLAVIX) 75 MG tablet TAKE 1 TABLET BY MOUTH EVERY DAY. RESUME ONCE YOU NO LONGER HAVE ANY BLEEDING IN YOUR URINE OR STOOL AND HAVE COMPLETED ALL SCHEDULED PROCEDURES  . diphenhydrAMINE HCl (BENADRYL ITCH STOPPING EX) Apply 1 application topically 4 (four) times daily as needed (skin rash/irritation.).  [name] docusate sodium (COLACE) 100 MG capsule Take 100 mg by mouth 2 (two) times daily.  . hydrochlorothiazide (HYDRODIURIL) 25 MG tablet Take 1 tablet (25 mg total) by mouth daily.  . hydroxypropyl methylcellulose / hypromellose (ISOPTO TEARS / GONIOVISC) 2.5 % ophthalmic solution Place 1 drop into both eyes 4 (four) times daily as needed for dry eyes.   . hydrOXYzine (ATARAX/VISTARIL) 10 MG tablet Take 1 tablet (10 mg total) by mouth 3 (three) times daily as needed. For itching  . Multiple Vitamin (MULTIVITAMIN) LIQD Take 15 mLs by mouth 2 (two) times a week. Nature's Plus Source of Life Gold Liquid  . oxyCODONE (OXY IR/ROXICODONE) 5 MG immediate release tablet  Take 1 tablet (5 mg total) by mouth every 6 (six) hours as needed for moderate pain. (Patient not taking: No sig reported)  . polyethylene glycol (MIRALAX / GLYCOLAX) 17 g packet Take 17 g by mouth daily.  . predniSONE (DELTASONE) 20 MG tablet Take 2 tablets (40 mg total) by mouth daily. (Patient not taking: Reported on 04/18/2021)  . senna (SENOKOT) 8.6 MG tablet Take 1 tablet by mouth daily.   . simethicone (MYLICON) 976 MG chewable tablet Chew 125 mg by mouth every 6 (six) hours as needed for flatulence.  . triamcinolone cream (KENALOG) 0.1 % Apply 1 application topically 2 (two) times daily.  . [DISCONTINUED] potassium chloride (K-DUR) 10 MEQ tablet Take 2 tablets (20 mEq total) by mouth 2 (two) times daily.   No facility-administered encounter medications on file as of 04/23/2021.    Functional Status:  In your present state of health, do you have any difficulty performing the following activities: 04/18/2021 03/07/2021  Hearing? N N  Vision? N N  Difficulty concentrating or making decisions? N N  Walking or climbing stairs? N Y  Comment - dizzy sometimes  Dressing or bathing? N N  Doing errands, shopping? Y N  Comment doesn't drive -  Preparing Food and eating ? N N  Using the Toilet? N N  In the past six months, have you accidently leaked urine? N N  Do you have problems with loss of bowel control? N N  Managing your Medications? N N  Managing your Finances? N N  Housekeeping or managing your Housekeeping? N N  Some recent data might be hidden    Fall/Depression Screening:  PHQ 2/9 Scores 04/23/2021 04/18/2021 03/07/2021 05/25/2020 02/19/2017 11/29/2015  PHQ - 2 Score 0 0 1 1 0 1    Assessment:  Care Plan Care Plan : LCSW Plan of Care  Updates made by Francis Gaines, LCSW since 04/23/2021 12:00 AM    Problem: Reduce and Manage My Symptoms of Depression.   Priority: High    Long-Range Goal: Reduce and Manage My Symptoms of Depression.   Start Date: 04/23/2021  Expected End Date:  07/24/2021  This Visit's Progress: On track  Priority: High  Note:   Current barriers:    Acute Mental Health needs related to Depression and Family Stressors.  Needs Support, Education, and Care Coordination in order to meet unmet mental health needs. Clinical Goal(s):   Over the next 90 days, patient will work with LCSW to reduce and manage symptoms of Depression and Family Stressors.  Patient will increase knowledge and/or ability of:        Coping Skills, Healthy Habits, Self-Management Skills, Stress Reduction, Home Safety and Utilizing [employer] and Resources. Clinical Interventions:   Patient interviewed and appropriate assessments performed.  Assessed patient's previous treatment, needs, coping skills, current treatment, support system and barriers to care.  PHQ-2 and PHQ-9 Depression Screening Tool performed and results reviewed with patient.  Other interventions included:       Solution-Focused Therapy Performed, Mindfulness Meditation Strategies, Relaxation Techniques and Deep Breathing Exercises Encouraged, Active Listening/       Reflection Utilized, Emotional Support Provided, Problem Solving [place], Psychoeducation /Health Education, Motivational        Interviewing, Brief Cognitive Behavioral Therapy Initiated, Reviewed Mental Health Medications and Discussed Compliance, Quality of Sleep Assessed and       Sleep Hygiene Techniques Promoted, Support Group Participation Encouraged, Increase Level of Activity/Exercise, Verbalization of Feelings Encouraged,        Crisis Resource Education/Information Provided and Suicidal Ideation/Homicidal Ideation Assessed - None Present.  Provided mental health counseling with regards to Depression and Family Stressors.       Discussed plans with patient for ongoing care management follow up and provided patient with direct contact information for care management team.  Collaborated with [facility] regarding counseling and supportive services for patient.  Mailed patient a list of mental health agencies and agency providers in [place] that accept [employer].  Discussed several options for long-term counseling based on need and insurance.   Collaborated with Primary Care Physician, Dr. Jill Alexanders regarding development and update of comprehensive plan of care as evidenced by provider attestation and co-signature.  Inter-disciplinary care team collaboration (see longitudinal plan of care). Patient Goals/Self-Care Activities:  Avoid negative self-talk and practice positive thinking and self-talk.  Consider receiving counseling and supportive services through [facility].  Exercise at least 2 to 3 times per week, or as tolerated.  Journal feelings and what helps to feel better or worse.  Spend time with friends and engage in fun activities, at least 2 to 3 times per week.  Watch for early signs of feeling worse.  Begin personal counseling with LCSW on a weekly basis, to reduce and manage symptoms.  Review list of mental health agencies and agency providers in [place] that accept [employer] and begin outreach calls to establish long-term counseling and supportive services.  Consider self-enrollment in a support group.    Practice relaxation techniques, deep  breathing exercises and mindfulness meditation strategies, daily.  Talk about feelings with a friend, family member, or spiritual advisor.  Continue with compliance of taking prescription medications, try to obtain adequate rest, stay well-hydrated and eat a healthy, well-balanced diet. Follow-Up:  05/22/2021 at 1:00pm.      Goals Addressed              This Visit's Progress   .  Reduce and Manage My Symptoms of Depression. (pt-stated)   On track     Timeframe:  Long-Range Goal Priority:  High Start Date:   04/23/2021                         Expected End Date:   07/24/2021            Follow Up Date:  05/22/2021 at 1:00pm.  Patient Goals/Self-Care Activities:  Avoid negative self-talk and practice positive thinking and self-talk.  Consider receiving counseling and supportive services through [facility].  Exercise at least 2 to 3 times per week, or as tolerated.  Journal feelings and what helps to feel better or worse.  Spend time with friends and engage in fun activities, at least 2 to 3 times per week.  Watch for early signs of feeling worse.  Begin personal counseling with LCSW on a weekly basis, to reduce and manage symptoms.  Review list of mental health agencies and agency providers in [place] that accept [employer] and begin outreach calls to establish long-term counseling and supportive services.  Consider self-enrollment in a support group.    Practice relaxation techniques, deep breathing exercises and mindfulness meditation strategies, daily.  Talk about feelings with a friend, family member, or spiritual advisor.  Continue with compliance of taking prescription medications, try to obtain adequate rest, stay well-hydrated and eat a healthy, well-balanced diet.       Follow-Up:  05/22/2021 at 1:00pm.  Nat Christen, BSW, MSW, [place]  Licensed Clinical Social Worker  [place]  Mailing [place]. [address] [place], [place] [address] Physical Address-[address], [place], [place] [address] Toll Free Main # [phone] Fax # [phone] Cell # [phone]  [name]

## 2021-04-25 ENCOUNTER — Ambulatory Visit: Payer: Self-pay | Admitting: *Deleted

## 2021-05-22 ENCOUNTER — Other Ambulatory Visit: Payer: Self-pay | Admitting: *Deleted

## 2021-05-22 NOTE — Patient Outreach (Signed)
[place] [facility]) Care Management  05/22/2021  LAKEYN DOKKEN [birth year] [id number]     LCSW made an attempt to try and contact patient today to follow-up regarding social work services and resources; however, patient was unavailable at the time of LCSW's call.  LCSW left a HIPAA compliant message on voicemail for patient and is currently awaiting a return call.  LCSW will make a second outreach attempt within the next week, if a return call is not received from patient in the meantime.  Contact information for LCSW was left on voicemail for patient.  Nat Christen, BSW, MSW, LCSW  Licensed Education officer, [facility]  Mailing [place] [address], [place], [place] [address] Physical Address[address] [place], [place] [address] Toll Free Main # [phone] Fax # [phone] Cell # [phone]  [name]

## 2021-05-29 ENCOUNTER — Other Ambulatory Visit: Payer: Self-pay | Admitting: *Deleted

## 2021-05-29 ENCOUNTER — Encounter: Payer: Self-pay | Admitting: *Deleted

## 2021-05-29 ENCOUNTER — Other Ambulatory Visit: Payer: Self-pay

## 2021-05-29 NOTE — Patient Outreach (Signed)
[facility]) Care Management  05/29/2021  [name] June 19, 1946 [id number]   Telephone Assessment    Unsuccessful outreach attempt to patient. RN CM left HIPAA compliant voicemail message along with contact info.     Plan: RN CM will make outreach attempt to patient within the month of Aug if no return call from patient.   Enzo Montgomery, RN,BSN,CCM [place] Management Telephonic Care Management Coordinator Direct Phone: [phone] Toll Free: [phone] Fax: [phone]

## 2021-05-29 NOTE — Patient Outreach (Signed)
[facility]) Care Management  [facility] Social Work  05/29/2021  [name] 1946-11-16 [id number]  Subjective:  A second outreach call was attempted with patient today, without success.  HIPAA compliant messages were left on voicemail for patient, including LCSW's contact information, encouraging patient to return LCSW's call at her earliest convenience.  LCSW will make a third outreach attempt within the next 5-7 business days, if a return call is not received from patient in the meantime.  Encounter Medications:  Outpatient Encounter Medications as of 05/29/2021  Medication Sig   acetaminophen (TYLENOL) 500 MG tablet Take 1,000 mg by mouth every 6 (six) hours as needed for mild pain or moderate pain.   aspirin EC 81 MG tablet Take 81 mg by mouth every evening.    atorvastatin (LIPITOR) 20 MG tablet Take 1 tablet (20 mg total) by mouth daily.   Camphor-Eucalyptus-Menthol (VICKS VAPORUB) 4.73-1.2-2.6 % OINT Apply 1 application topically 3 (three) times daily as needed (leg discomfort).   Cholecalciferol (VITAMIN D) 50 MCG (2000 UT) tablet Take 2,000 Units by mouth every evening.    clopidogrel (PLAVIX) 75 MG tablet TAKE 1 TABLET BY MOUTH EVERY DAY. RESUME ONCE YOU NO LONGER HAVE ANY BLEEDING IN YOUR URINE OR STOOL AND HAVE COMPLETED ALL SCHEDULED PROCEDURES   diphenhydrAMINE HCl (BENADRYL ITCH STOPPING EX) Apply 1 application topically 4 (four) times daily as needed (skin rash/irritation.).   docusate sodium (COLACE) 100 MG capsule Take 100 mg by mouth 2 (two) times daily.   hydrochlorothiazide (HYDRODIURIL) 25 MG tablet Take 1 tablet (25 mg total) by mouth daily.   hydroxypropyl methylcellulose / hypromellose (ISOPTO TEARS / GONIOVISC) 2.5 % ophthalmic solution Place 1 drop into both eyes 4 (four) times daily as needed for dry eyes.    hydrOXYzine (ATARAX/VISTARIL) 10 MG tablet Take 1 tablet (10 mg total) by mouth 3 (three) times daily as needed. For itching   Multiple  Vitamin (MULTIVITAMIN) LIQD Take 15 mLs by mouth 2 (two) times a week. Nature's Plus Source of Life Gold Liquid   oxyCODONE (OXY IR/ROXICODONE) 5 MG immediate release tablet Take 1 tablet (5 mg total) by mouth every 6 (six) hours as needed for moderate pain. (Patient not taking: No sig reported)   polyethylene glycol (MIRALAX / GLYCOLAX) 17 g packet Take 17 g by mouth daily.   predniSONE (DELTASONE) 20 MG tablet Take 2 tablets (40 mg total) by mouth daily. (Patient not taking: Reported on 04/18/2021)   senna (SENOKOT) 8.6 MG tablet Take 1 tablet by mouth daily.    simethicone (MYLICON) 973 MG chewable tablet Chew 125 mg by mouth every 6 (six) hours as needed for flatulence.   triamcinolone cream (KENALOG) 0.1 % Apply 1 application topically 2 (two) times daily.   [DISCONTINUED] potassium chloride (K-DUR) 10 MEQ tablet Take 2 tablets (20 mEq total) by mouth 2 (two) times daily.   No facility-administered encounter medications on file as of 05/29/2021.    Functional Status:  In your present state of health, do you have any difficulty performing the following activities: 04/18/2021 03/07/2021  Hearing? N N  Vision? N N  Difficulty concentrating or making decisions? N N  Walking or climbing stairs? N Y  Comment - dizzy sometimes  Dressing or bathing? N N  Doing errands, shopping? Y N  Comment doesn't drive -  Preparing Food and eating ? N N  Using the Toilet? N N  In the past six months, have you accidently leaked urine? N N  Do you have problems with loss of bowel control? N N  Managing your Medications? N N  Managing your Finances? N N  Housekeeping or managing your Housekeeping? N N  Some recent data might be hidden    Fall/Depression Screening:  PHQ 2/9 Scores 04/23/2021 04/18/2021 03/07/2021 05/25/2020 02/19/2017 11/29/2015  PHQ - 2 Score 0 0 1 1 0 1    Assessment:  Care Plan There are no care plans that you recently modified to display for this patient.    Goals Addressed   None     Follow-Up Date:  06/06/2021 at 10:00am.  Nat Christen, BSW, MSW, [place]  Licensed Clinical Social Worker  [place]  Mailing [place]. [address] [place], [place] [address] Physical Address[address] [place], [place] [address] Toll Free Main # [phone] Fax # [phone] Cell # [phone]  [name]

## 2021-06-06 ENCOUNTER — Other Ambulatory Visit: Payer: Self-pay | Admitting: *Deleted

## 2021-06-06 NOTE — Patient Outreach (Signed)
[facility]) Care Management  [facility] Social Work  06/06/2021  ARLYLE STRATE [birth year] [id number]  Patient is agreeable to a referral to [facility] for ongoing (long-term) mental health counseling and supportive services.  Encounter Medications:  Outpatient Encounter Medications as of 06/06/2021  Medication Sig   acetaminophen (TYLENOL) 500 MG tablet Take 1,000 mg by mouth every 6 (six) hours as needed for mild pain or moderate pain.   aspirin EC 81 MG tablet Take 81 mg by mouth every evening.    atorvastatin (LIPITOR) 20 MG tablet Take 1 tablet (20 mg total) by mouth daily.   Camphor-Eucalyptus-Menthol (VICKS VAPORUB) 4.73-1.2-2.6 % OINT Apply 1 application topically 3 (three) times daily as needed (leg discomfort).   Cholecalciferol (VITAMIN D) 50 MCG (2000 UT) tablet Take 2,000 Units by mouth every evening.    clopidogrel (PLAVIX) 75 MG tablet TAKE 1 TABLET BY MOUTH EVERY DAY. RESUME ONCE YOU NO LONGER HAVE ANY BLEEDING IN YOUR URINE OR STOOL AND HAVE COMPLETED ALL SCHEDULED PROCEDURES   diphenhydrAMINE HCl (BENADRYL ITCH STOPPING EX) Apply 1 application topically 4 (four) times daily as needed (skin rash/irritation.).   docusate sodium (COLACE) 100 MG capsule Take 100 mg by mouth 2 (two) times daily.   hydrochlorothiazide (HYDRODIURIL) 25 MG tablet Take 1 tablet (25 mg total) by mouth daily.   hydroxypropyl methylcellulose / hypromellose (ISOPTO TEARS / GONIOVISC) 2.5 % ophthalmic solution Place 1 drop into both eyes 4 (four) times daily as needed for dry eyes.    hydrOXYzine (ATARAX/VISTARIL) 10 MG tablet Take 1 tablet (10 mg total) by mouth 3 (three) times daily as needed. For itching   Multiple Vitamin (MULTIVITAMIN) LIQD Take 15 mLs by mouth 2 (two) times a week. Nature's Plus Source of Life Gold Liquid   oxyCODONE (OXY IR/ROXICODONE) 5 MG immediate release tablet Take 1 tablet (5 mg total) by mouth every 6 (six) hours as needed for moderate pain. (Patient not taking:  No sig reported)   polyethylene glycol (MIRALAX / GLYCOLAX) 17 g packet Take 17 g by mouth daily.   predniSONE (DELTASONE) 20 MG tablet Take 2 tablets (40 mg total) by mouth daily. (Patient not taking: Reported on 04/18/2021)   senna (SENOKOT) 8.6 MG tablet Take 1 tablet by mouth daily.    simethicone (MYLICON) 0000000 MG chewable tablet Chew 125 mg by mouth every 6 (six) hours as needed for flatulence.   triamcinolone cream (KENALOG) 0.1 % Apply 1 application topically 2 (two) times daily.   [DISCONTINUED] potassium chloride (K-DUR) 10 MEQ tablet Take 2 tablets (20 mEq total) by mouth 2 (two) times daily.   No facility-administered encounter medications on file as of 06/06/2021.    Functional Status:  In your present state of health, do you have any difficulty performing the following activities: 04/18/2021 03/07/2021  Hearing? N N  Vision? N N  Difficulty concentrating or making decisions? N N  Walking or climbing stairs? N Y  Comment - dizzy sometimes  Dressing or bathing? N N  Doing errands, shopping? Y N  Comment doesn't drive -  Preparing Food and eating ? N N  Using the Toilet? N N  In the past six months, have you accidently leaked urine? N N  Do you have problems with loss of bowel control? N N  Managing your Medications? N N  Managing your Finances? N N  Housekeeping or managing your Housekeeping? N N  Some recent data might be hidden    Fall/Depression Screening:  PHQ 2/9  Scores 04/23/2021 04/18/2021 03/07/2021 05/25/2020 02/19/2017 11/29/2015  PHQ - 2 Score 0 0 1 1 0 1    Assessment:  Care Plan Care Plan : LCSW Plan of Care  Updates made by Francis Gaines, LCSW since 06/06/2021 12:00 AM     Problem: Reduce and Manage My Symptoms of Depression.   Priority: High     Long-Range Goal: Reduce and Manage My Symptoms of Depression.   Start Date: 04/23/2021  Expected End Date: 07/24/2021  This Visit's Progress: On track  Recent Progress: On track  Priority: High  Note:   Current  barriers:   Acute Mental Health needs related to Depression and Family Stressors. Needs Support, Education, and Care Coordination in order to meet unmet mental health needs. Clinical Goal(s):  Over the next 90 days, patient will work with LCSW to reduce and manage symptoms of Depression and Family Stressors. Patient will increase knowledge and/or ability of:        Coping Skills, Healthy Habits, Self-Management Skills, Stress Reduction, Home Safety and Utilizing [employer] and Resources. Clinical Interventions:  Patient interviewed and appropriate assessments performed. Assessed patient's previous treatment, needs, coping skills, current treatment, support system and barriers to care. PHQ-2 and PHQ-9 Depression Screening Tool performed and results reviewed with patient. Other interventions included:       Solution-Focused Therapy Performed, Mindfulness Meditation Strategies, Relaxation Techniques and Deep Breathing Exercises Encouraged, Active Listening/       Reflection Utilized, Emotional Support Provided, Problem Solving [place], Psychoeducation /Health Education, Motivational        Interviewing, Brief Cognitive Behavioral Therapy Initiated, Reviewed Mental Health Medications and Discussed Compliance, Quality of Sleep Assessed and       Sleep Hygiene Techniques Promoted, Support Group Participation Encouraged, Increase Level of Activity/Exercise, Verbalization of Feelings Encouraged,        Crisis Resource Education/Information Provided and Suicidal Ideation/Homicidal Ideation Assessed - None Present. Provided mental health counseling with regards to Depression and Family Stressors.      Discussed plans with patient for ongoing care management follow up and provided patient with direct contact information for care management team. Collaborated with [facility] regarding counseling and supportive services for patient. Mailed patient a  list of mental health agencies and agency providers in [place] that accept [employer]. Discussed several options for long-term counseling based on need and insurance.  Collaborated with Primary Care Physician, Dr. Jill Alexanders regarding development and update of comprehensive plan of care as evidenced by provider attestation and co-signature. Inter-disciplinary care team collaboration (see longitudinal plan of care). Referral placed to [facility] for ongoing mental health counseling and supportive services. Patient Goals/Self-Care Activities: Try to limit the amount of time per day/week that you allow yourself to listen to your 6 siblings (sisters) complain and talk negatively toward you. Avoid negative talk and practice positive thinking and self-talk. With your verbal consent, a referral has been made to  to provide you with ongoing mental health counseling and supportive services. Journal feelings when you are experiencing caregiver stress/burnout, or when you become overwhelmed with trying to solve all of your immediate and extended family members problems.   Spend time with friends and engage in fun activities, at least 2 to 3 times per week. Continue to receive weekly personal counseling and supportive services from LCSW, until established with a long-term therapist with [facility].   Review list of mental health agencies and agency providers in [place] that accept [employer] and  begin outreach calls to establish long-term counseling and supportive services, if [facility] does not offer you the services you need. Consider self-enrollment in a caregiver support group.   Practice relaxation techniques, deep breathing exercises and mindfulness meditation strategies, daily. Talk about feelings with a friend, spiritual advisor and/or LCSW. Continue with compliance of taking prescription medications. Try to obtain adequate rest. Stay well-hydrated and eat a healthy,  well-balanced diet. Follow-Up:  06/13/2021 at 11:30am.       Goals Addressed               This Visit's Progress     Reduce and Manage My Symptoms of Depression. (pt-stated)   On track     Timeframe:  Long-Range Goal Priority:  High Start Date:   04/23/2021                        Expected End Date:   07/24/2021            Follow-Up Date:  06/13/2021 at 11:30am.  Patient Goals/Self-Care Activities: Try to limit the amount of time per day/week that you allow yourself to listen to your 6 siblings (sisters) complain and talk negatively toward you. Avoid negative talk and practice positive thinking and self-talk. With your verbal consent, a referral has been made to [facility] to provide you with ongoing mental health counseling and supportive services. Journal feelings when you are experiencing caregiver stress/burnout, or when you become overwhelmed with trying to solve all of your immediate and extended family members problems.   Spend time with friends and engage in fun activities, at least 2 to 3 times per week. Continue to receive weekly personal counseling and supportive services from LCSW, until established with a long-term therapist with [facility].   Review list of mental health agencies and agency providers in [facility] that accept [employer] and begin outreach calls to establish long-term counseling and supportive services, if [facility] does not offer you the services you need. Consider self-enrollment in a caregiver support group.   Practice relaxation techniques, deep breathing exercises and mindfulness meditation strategies, daily. Talk about feelings with a friend, spiritual advisor and/or LCSW. Continue with compliance of taking prescription medications. Try to obtain adequate rest. Stay well-hydrated and eat a healthy, well-balanced diet.         Follow-Up:  06/13/2021 at 11:30am.  Nat Christen, BSW, MSW, [place]  Licensed Clinical Social Worker  [place]  Mailing [place]. [address], [place], [place] [address] Physical Address-[address], [place], [place] [address] Toll Free Main # [phone] Fax # [phone] Cell # [phone]  [name]

## 2021-06-13 ENCOUNTER — Encounter: Payer: Self-pay | Admitting: *Deleted

## 2021-06-13 ENCOUNTER — Other Ambulatory Visit: Payer: Self-pay | Admitting: *Deleted

## 2021-06-13 NOTE — Patient Outreach (Signed)
[facility]) Care Management  [facility] Social Work  06/13/2021  [name] [birth year] [id number]  Successful handoff to [facility] for ongoing mental health counseling and supportive services.  Encounter Medications:  Outpatient Encounter Medications as of 06/13/2021  Medication Sig   acetaminophen (TYLENOL) 500 MG tablet Take 1,000 mg by mouth every 6 (six) hours as needed for mild pain or moderate pain.   aspirin EC 81 MG tablet Take 81 mg by mouth every evening.    atorvastatin (LIPITOR) 20 MG tablet Take 1 tablet (20 mg total) by mouth daily.   Camphor-Eucalyptus-Menthol (VICKS VAPORUB) 4.73-1.2-2.6 % OINT Apply 1 application topically 3 (three) times daily as needed (leg discomfort).   Cholecalciferol (VITAMIN D) 50 MCG (2000 UT) tablet Take 2,000 Units by mouth every evening.    clopidogrel (PLAVIX) 75 MG tablet TAKE 1 TABLET BY MOUTH EVERY DAY. RESUME ONCE YOU NO LONGER HAVE ANY BLEEDING IN YOUR URINE OR STOOL AND HAVE COMPLETED ALL SCHEDULED PROCEDURES   diphenhydrAMINE HCl (BENADRYL ITCH STOPPING EX) Apply 1 application topically 4 (four) times daily as needed (skin rash/irritation.).   docusate sodium (COLACE) 100 MG capsule Take 100 mg by mouth 2 (two) times daily.   hydrochlorothiazide (HYDRODIURIL) 25 MG tablet Take 1 tablet (25 mg total) by mouth daily.   hydroxypropyl methylcellulose / hypromellose (ISOPTO TEARS / GONIOVISC) 2.5 % ophthalmic solution Place 1 drop into both eyes 4 (four) times daily as needed for dry eyes.    hydrOXYzine (ATARAX/VISTARIL) 10 MG tablet Take 1 tablet (10 mg total) by mouth 3 (three) times daily as needed. For itching   Multiple Vitamin (MULTIVITAMIN) LIQD Take 15 mLs by mouth 2 (two) times a week. Nature's Plus Source of Life Gold Liquid   oxyCODONE (OXY IR/ROXICODONE) 5 MG immediate release tablet Take 1 tablet (5 mg total) by mouth every 6 (six) hours as needed for moderate pain. (Patient not taking: No sig reported)    polyethylene glycol (MIRALAX / GLYCOLAX) 17 g packet Take 17 g by mouth daily.   predniSONE (DELTASONE) 20 MG tablet Take 2 tablets (40 mg total) by mouth daily. (Patient not taking: Reported on 04/18/2021)   senna (SENOKOT) 8.6 MG tablet Take 1 tablet by mouth daily.    simethicone (MYLICON) 0000000 MG chewable tablet Chew 125 mg by mouth every 6 (six) hours as needed for flatulence.   triamcinolone cream (KENALOG) 0.1 % Apply 1 application topically 2 (two) times daily.   [DISCONTINUED] potassium chloride (K-DUR) 10 MEQ tablet Take 2 tablets (20 mEq total) by mouth 2 (two) times daily.   No facility-administered encounter medications on file as of 06/13/2021.    Functional Status:  In your present state of health, do you have any difficulty performing the following activities: 04/18/2021 03/07/2021  Hearing? N N  Vision? N N  Difficulty concentrating or making decisions? N N  Walking or climbing stairs? N Y  Comment - dizzy sometimes  Dressing or bathing? N N  Doing errands, shopping? Y N  Comment doesn't drive -  Preparing Food and eating ? N N  Using the Toilet? N N  In the past six months, have you accidently leaked urine? N N  Do you have problems with loss of bowel control? N N  Managing your Medications? N N  Managing your Finances? N N  Housekeeping or managing your Housekeeping? N N  Some recent data might be hidden    Fall/Depression Screening:  PHQ 2/9 Scores 04/23/2021 04/18/2021 03/07/2021 05/25/2020  02/19/2017 11/29/2015  PHQ - 2 Score 0 0 1 1 0 1    Assessment:  Care Plan Care Plan : LCSW Plan of Care  Updates made by Julie Gaines, LCSW since 06/13/2021 12:00 AM     Problem: Reduce and Manage My Symptoms of Depression. Resolved 06/13/2021  Priority: High     Long-Range Goal: Reduce and Manage My Symptoms of Depression. Completed 06/13/2021  Start Date: 04/23/2021  Expected End Date: 07/24/2021  This Visit's Progress: On track  Recent Progress: On track  Priority: High   Note:   Current Barriers:   Acute Mental Health needs related to Depression and Family Stressors. Needs Support, Education, and Care Coordination in order to meet unmet mental health needs. Clinical Goal(s):  Over the next 90 days, patient will work with LCSW to reduce and manage symptoms of Depression and Family Stressors. Patient will increase knowledge and/or ability of:        Coping Skills, Healthy Habits, Self-Management Skills, Stress Reduction, Home Safety and Utilizing [employer] and Resources. Clinical Interventions:  Patient interviewed and appropriate assessments performed. Assessed patient's previous treatment, needs, coping skills, current treatment, support system and barriers to care. PHQ-2 and PHQ-9 Depression Screening Tool performed and results reviewed with patient. Other interventions included:       Solution-Focused Therapy Performed, Mindfulness Meditation Strategies, Relaxation Techniques and Deep Breathing Exercises Encouraged, Active Listening/       Reflection Utilized, Emotional Support Provided, Problem Solving Experiment, Psychoeducation /Health Education, Motivational        Interviewing, Brief Cognitive Behavioral Therapy Initiated, Reviewed Mental Health Medications and Discussed Compliance, Quality of Sleep Assessed and       Sleep Hygiene Techniques Promoted, Support Group Participation Encouraged, Increase Level of Activity/Exercise, Verbalization of Feelings Encouraged,        Crisis Resource Education/Information Provided and Suicidal Ideation/Homicidal Ideation Assessed - None Present. Provided mental health counseling with regards to Depression and Family Stressors.      Discussed plans with patient for ongoing care management follow up and provided patient with direct contact information for care management team. Collaborated with [facility] regarding counseling and supportive services for  patient. Mailed patient a list of mental health agencies and agency providers in [place] that accept [employer]. Discussed several options for long-term counseling based on need and insurance.  Collaborated with Primary Care Physician, Dr. Jill Alexanders regarding development and update of comprehensive plan of care as evidenced by provider attestation and co-signature. Inter-disciplinary care team collaboration (see longitudinal plan of care). Referral placed to [facility] for ongoing mental health counseling and supportive services. Patient Goals/Self-Care Activities: Try to limit the amount of time per day/week that you allow yourself to listen to your 6 siblings (sisters) complain and talk negatively toward you. Avoid negative talk and practice positive thinking and self-talk. Successful handoff and transition to [facility] for ongoing mental health counseling and supportive services. Continue to journal feelings when you are experiencing caregiver stress/burnout, or when you become overwhelmed with trying to solve all of your immediate and extended family members problems.   Spend time with friends and engage in fun activities, at least 2 to 3 times per week. Review list of mental health agencies and agency providers in [place] that accept [employer] and begin outreach calls to establish long-term counseling and supportive services, in the event that [facility] does not work out for you. Consider self-enrollment in a caregiver support group.  Practice relaxation techniques, deep breathing exercises and mindfulness meditation strategies, daily. Continue with compliance of taking prescription medications. Try to obtain adequate rest. Stay well-hydrated and eat a healthy, well-balanced diet. Be mindful of your own health, happiness and well-being. Follow-Up:  No Follow-Up Required.       Goals Addressed               This Visit's Progress     COMPLETED: Reduce and  Manage My Symptoms of Depression. (pt-stated)   On track     Timeframe:  Long-Range Goal Priority:  High Start Date:   04/23/2021                        Expected End Date:  06/13/2021           Follow-Up Date:  No Follow-Up Required.  Patient Goals/Self-Care Activities: Try to limit the amount of time per day/week that you allow yourself to listen to your 6 siblings (sisters) complain and talk negatively toward you. Avoid negative talk and practice positive thinking and self-talk. Successful handoff and transition to [facility] for ongoing mental health counseling and supportive services. Continue to journal feelings when you are experiencing caregiver stress/burnout, or when you become overwhelmed with trying to solve all of your immediate and extended family members problems.   Spend time with friends and engage in fun activities, at least 2 to 3 times per week. Review list of mental health agencies and agency providers in [place] that accept [employer] and begin outreach calls to establish long-term counseling and supportive services, in the event that [facility] does not work out for you. Consider self-enrollment in a caregiver support group.   Practice relaxation techniques, deep breathing exercises and mindfulness meditation strategies, daily. Continue with compliance of taking prescription medications. Try to obtain adequate rest. Stay well-hydrated and eat a healthy, well-balanced diet. Be mindful of your own health, happiness and well-being.       Follow-Up:  No Follow-Up Required.  Nat Christen, BSW, MSW, LCSW  Licensed Education officer, [facility]  Mailing [place] [address] [place], [place] [address] Physical Address-[address] [place], [place] [address] Toll Free Main # [phone] Fax # [phone] Cell # [phone]  [name]

## 2021-07-11 ENCOUNTER — Other Ambulatory Visit: Payer: Self-pay

## 2021-07-11 NOTE — Patient Outreach (Signed)
[facility]) Care Management  07/11/2021  [name] [birth year] [id number]   Telephone Assessment    Voicemail message received from patient returning RN CM call. Return call placed to patient. Spoke with patient who is pleased to report how much better she has been feeling and doing. She has been doing more things to help protect and preserve her peace and mental health which she states has helped her health overall. RN CM praised and commended patient for her behaviors. She denies any RN CM needs or concerns at this time.   Medications Reviewed Today     Reviewed by Hayden Pedro, RN (Registered Nurse) on 07/11/21 at 1147  Med List Status: <None>   Medication Order Taking? Sig Documenting Provider Last Dose Status Informant  acetaminophen (TYLENOL) 500 MG tablet [id number] No Take 1,000 mg by mouth every 6 (six) hours as needed for mild pain or moderate pain. [provider] Taking Active Self  aspirin EC 81 MG tablet [id number] No Take 81 mg by mouth every evening.  [provider] Taking Active Self           Med Note Tamala Julian, Verlin Dike Sep 22, 2019 11:24 AM)    atorvastatin (LIPITOR) 20 MG tablet [id number] No Take 1 tablet (20 mg total) by mouth daily. Denita Lung, MD Taking Active   Camphor-Eucalyptus-Menthol [facility] VAPORUB) 4.73-1.2-2.6 % OINT [id number] No Apply 1 application topically 3 (three) times daily as needed (leg discomfort). [provider] Taking Active Self  Cholecalciferol (VITAMIN D) 50 MCG (2000 UT) tablet [id number] No Take 2,000 Units by mouth every evening.  [provider] Taking Active Self  clopidogrel (PLAVIX) 75 MG tablet [id number] No TAKE 1 TABLET BY MOUTH EVERY DAY. RESUME ONCE YOU NO LONGER HAVE ANY BLEEDING IN YOUR URINE OR STOOL AND HAVE COMPLETED ALL SCHEDULED PROCEDURES Denita Lung, MD Taking Active   diphenhydrAMINE HCl (BENADRYL ITCH STOPPING EX) [id number] No Apply 1  application topically 4 (four) times daily as needed (skin rash/irritation.). [provider] Taking Active Self  docusate sodium (COLACE) 100 MG capsule [id number] No Take 100 mg by mouth 2 (two) times daily. [provider] Taking Active Self  hydrochlorothiazide (HYDRODIURIL) 25 MG tablet [id number] No Take 1 tablet (25 mg total) by mouth daily. Denita Lung, MD Taking Active   hydroxypropyl methylcellulose / hypromellose (ISOPTO TEARS / GONIOVISC) 2.5 % ophthalmic solution [id number] No Place 1 drop into both eyes 4 (four) times daily as needed for dry eyes.  [provider] Taking Active Self  hydrOXYzine (ATARAX/VISTARIL) 10 MG tablet [id number] No Take 1 tablet (10 mg total) by mouth 3 (three) times daily as needed. For itching Tysinger, Camelia Eng, PA-C Taking Active   Multiple Vitamin (MULTIVITAMIN) LIQD [id number] No Take 15 mLs by mouth 2 (two) times a week. Nature's Plus Source of Life Gold Liquid [provider] Taking Active Self  oxyCODONE (OXY IR/ROXICODONE) 5 MG immediate release tablet [id number] No Take 1 tablet (5 mg total) by mouth every 6 (six) hours as needed for moderate pain.  Patient not taking: No sig reported   Ulyses Amor, PA-C Not Taking Active   polyethylene glycol (MIRALAX / GLYCOLAX) 17 g packet [id number] No Take 17 g by mouth daily. [provider] Taking Active Self  Discontinued 01/27/12 1412 (Error)   predniSONE (DELTASONE) 20 MG tablet [id number] No Take 2 tablets (40 mg total) by mouth daily.  Patient not taking: Reported on  04/18/2021   Tysinger, Camelia Eng, PA-C Not Taking Active   senna (SENOKOT) 8.6 MG tablet [id number] No Take 1 tablet by mouth daily.  [provider] Taking Active Self  simethicone (MYLICON) 0000000 MG chewable tablet [id number] No Chew 125 mg by mouth every 6 (six) hours as needed for flatulence. [provider] Taking Active Self  triamcinolone cream (KENALOG) 0.1 % [id number] No Apply 1  application topically 2 (two) times daily. Tysinger, Camelia Eng, PA-C Taking Active              Goals Addressed               This Visit's Progress     (THN)Adequate Coping and Stress Mgmt (pt-stated)          Timeframe:  Long-Range Goal Priority:  High Start Date:  04/18/2021                           Expected End Date:  Oct 2022 Follow Up Date:Oct 2022   Barriers: Health Behaviors Knowledge                       -pt will verbalize at least 2-3 ways to manage stress -pt will seek mental health/counseling resources/assistance   Notes: 04/18/2021-Patient admits to family stressors that causes her to chain smoke. She is requesting some counseling/mental health resources and referral made to [place] also encouraged to follow up and discus with PCP.  07/11/21-Patient reports she has spoken with [place] SW who has also referred her to counseling Nurse, children's). Patient reports office called to make an appt but pt did not schedule due to increased co-pay($69/hr). Patient reports she will check with insurance for possible other in network options. Patient pleased to report that she has been using coping strategies and suggestions provided to her by Rn and SW and feels like they re helping. She is in better spirits, coping better and learning who to set boundaries with her family.       (THN)Track and Manage My Blood Pressure-Hypertension (pt-stated)        Timeframe:  Long-Range Goal Priority:  High Start Date:   04/18/2021                          Expected End Date: Oct2022                      Follow Up Date Oct 2022   Barriers: Health Behaviors Knowledge  - check blood pressure weekly - write blood pressure results in a log or diary    Why is this important?   You won't feel high blood pressure, but it can still hurt your blood vessels.  High blood pressure can cause heart or kidney problems. It can also cause a stroke.  Making lifestyle changes like losing a little weight  or eating less salt will help.  Checking your blood pressure at home and at different times of the day can help to control blood pressure.  If the doctor prescribes medicine remember to take it the way the doctor ordered.  Call the office if you cannot afford the medicine or if there are questions about it.     Notes:   04/18/2021-Patient admits that she has BP machine in the home but does not check BP. Educated on importance of BP monitoring given taking BP meds and normal  BP parameters. RN CM encouraged patient to begin checking BP at least  1-2x  a week. Patient reports adherence to low salt diet.   07/11/21-Patient admits that she has been slack concerning BP monitoring in the home an has only been checking it about 1-2x/month. She reports she will try to be more consistent. She is unable to recall BP but reports she was told it was good at her last MD appt.           Plan: RN CM discussed with patient next outreach within the month of Oct. Patient agrees to care plan and follow up. Patient gave verbal consent and in agreement with RN CM follow up and timeframe. Patient aware that they may contact RN CM sooner for any issues or concerns. RN CM reviewed goals and plan of care with patient.  Enzo Montgomery, RN,BSN,CCM [facility] Telephonic Care Management Coordinator Direct Phone: [phone] Toll Free: [phone] Fax: [phone]

## 2021-07-11 NOTE — Patient Outreach (Addendum)
[place]) Care Management  07/11/2021  SUMIKO LANZER 1946-03-01 [id number]   Telephone Assessment   Unsuccessful outreach attempt to patient. RN CM left HIPAA compliant voicemail along with contact info.    Plan: RN CM will make outreach attempt to patient within the month of Oct if no return call.  RN CM will send unsuccessful outreach letter.   Enzo Montgomery, RN,BSN,CCM [place] [facility] Telephonic Care Management Coordinator Direct Phone: [phone] Toll Free: [phone] Fax: [phone]

## 2021-07-12 ENCOUNTER — Telehealth (INDEPENDENT_AMBULATORY_CARE_PROVIDER_SITE_OTHER): Payer: Medicare HMO | Admitting: Medical

## 2021-07-12 ENCOUNTER — Encounter: Payer: Self-pay | Admitting: Medical

## 2021-07-12 ENCOUNTER — Other Ambulatory Visit: Payer: Self-pay

## 2021-07-12 ENCOUNTER — Other Ambulatory Visit: Payer: Medicare HMO

## 2021-07-12 VITALS — BP 116/72 | Wt 124.0 lb

## 2021-07-12 DIAGNOSIS — J3489 Other specified disorders of nose and nasal sinuses: Secondary | ICD-10-CM

## 2021-07-12 DIAGNOSIS — R52 Pain, unspecified: Secondary | ICD-10-CM | POA: Diagnosis not present

## 2021-07-12 DIAGNOSIS — R059 Cough, unspecified: Secondary | ICD-10-CM

## 2021-07-12 DIAGNOSIS — Z20822 Contact with and (suspected) exposure to covid-19: Secondary | ICD-10-CM | POA: Diagnosis not present

## 2021-07-12 MED ORDER — PROMETHAZINE-DM 6.25-15 MG/5ML PO SYRP
5.0000 mL | ORAL_SOLUTION | Freq: Four times a day (QID) | ORAL | 0 refills | Status: DC | PRN
Start: 2021-07-12 — End: 2022-06-23

## 2021-07-12 MED ORDER — MOLNUPIRAVIR EUA 200MG CAPSULE: 4.0000 | ORAL_CAPSULE | Freq: Two times a day (BID) | ORAL | 0 refills | Status: AC

## 2021-07-12 NOTE — Progress Notes (Signed)
Subjective:     Patient ID: [name], female   DOB: Jan 25, 1946, 75 y.o.   MRN: [id number]  This visit type was conducted due to national recommendations for restrictions regarding the COVID-19 Pandemic (e.g. social distancing) in an effort to limit this patient's exposure and mitigate transmission in our community.  Due to their co-morbid illnesses, this patient is at least at moderate risk for complications without adequate follow up.  This format is felt to be most appropriate for this patient at this time.    Documentation for virtual audio and video telecommunications through [place] encounter:  The patient was located at home. The provider was located in the office. The patient did consent to this visit and is aware of possible charges through their insurance for this visit.  The other persons participating in this telemedicine service were none. Time spent on call was 20 minutes and in review of previous records 20 minutes total.  This virtual service is not related to other E/M service within previous 7 days.   HPI Chief Complaint  Patient presents with   Covid Exposure    Started monday   Nasal Congestion   Cough   Dizziness   Virtual consult for not feeling well.   Been feeling bad since Monday 4 days ago.   Having bad sinus and head pressure, cough started yesterday, some dizziness, nasal congestion, lightheaded.  Has waves of nausea.  No vomiting.  No diarrhea.  Has some aches.  No chills.  No fever.  No loss of taste or smell.  Husband tested positive yesterday for covid and he has sick symptoms as well.  Using some allergy tablets.     No prior illness to covid, has had covid vaccine and boosters.  No other aggravating or relieving factors. No other complaint.  Past Medical History:  Diagnosis Date   Abdominal wall hernia    Blood transfusion    30+ yrs. ago   Cancer ([place])    Right Breast   Hyperlipidemia    Hypertension    Peripheral vascular  disease (HCC)    Seasonal allergies    TIA (transient ischemic attack)    Current Outpatient Medications on File Prior to Visit  Medication Sig Dispense Refill   acetaminophen (TYLENOL) 500 MG tablet Take 1,000 mg by mouth every 6 (six) hours as needed for mild pain or moderate pain.     aspirin EC 81 MG tablet Take 81 mg by mouth every evening.      atorvastatin (LIPITOR) 20 MG tablet Take 1 tablet (20 mg total) by mouth daily. 90 tablet 3   Camphor-Eucalyptus-Menthol (VICKS VAPORUB) 4.73-1.2-2.6 % OINT Apply 1 application topically 3 (three) times daily as needed (leg discomfort).     Cholecalciferol (VITAMIN D) 50 MCG (2000 UT) tablet Take 2,000 Units by mouth every evening.      clopidogrel (PLAVIX) 75 MG tablet TAKE 1 TABLET BY MOUTH EVERY DAY. RESUME ONCE YOU NO LONGER HAVE ANY BLEEDING IN YOUR URINE OR STOOL AND HAVE COMPLETED ALL SCHEDULED PROCEDURES 90 tablet 3   docusate sodium (COLACE) 100 MG capsule Take 100 mg by mouth 2 (two) times daily.     hydrochlorothiazide (HYDRODIURIL) 25 MG tablet Take 1 tablet (25 mg total) by mouth daily. 90 tablet 3   hydroxypropyl methylcellulose / hypromellose (ISOPTO TEARS / GONIOVISC) 2.5 % ophthalmic solution Place 1 drop into both eyes 4 (four) times daily as needed for dry eyes.      hydrOXYzine (ATARAX/VISTARIL)  10 MG tablet Take 1 tablet (10 mg total) by mouth 3 (three) times daily as needed. For itching 30 tablet 0   Multiple Vitamin (MULTIVITAMIN) LIQD Take 15 mLs by mouth 2 (two) times a week. Nature's Plus Source of Life Gold Liquid     polyethylene glycol (MIRALAX / GLYCOLAX) 17 g packet Take 17 g by mouth daily.     senna (SENOKOT) 8.6 MG tablet Take 1 tablet by mouth daily.      simethicone (MYLICON) 0000000 MG chewable tablet Chew 125 mg by mouth every 6 (six) hours as needed for flatulence.     triamcinolone cream (KENALOG) 0.1 % Apply 1 application topically 2 (two) times daily. 30 g 0   [DISCONTINUED] potassium chloride (K-DUR) 10 MEQ  tablet Take 2 tablets (20 mEq total) by mouth 2 (two) times daily. 30 tablet 2   No current facility-administered medications on file prior to visit.     Review of Systems As in subjective    Objective:   Physical Exam Due to coronavirus pandemic stay at home measures, patient visit was virtual and they were not examined in person.   BP 116/72   Wt 124 lb (56.2 kg)   BMI 23.43 kg/m   Wt Readings from Last 3 Encounters:  07/12/21 124 lb (56.2 kg)  04/05/21 126 lb 12.8 oz (57.5 kg)  03/07/21 126 lb 3.2 oz (57.2 kg)   Gen: nad, no      Assessment:     Encounter Diagnoses  Name Primary?   Close exposure to COVID-19 virus Yes   Cough    Sinus pressure    Body aches         Plan:      General recommendations: I recommend you rest, hydrate well with water and clear fluids throughout the day.   You can use Tylenol for pain or fever  We discussed the medications below Molnupiravir and Promethazine DM.  We discussed risk and benefits of medications, proper use of medication.  If you are having trouble breathing, if you are very weak, have high fever 103 or higher consistently despite Tylenol, or uncontrollable nausea and vomiting, then call or go to the emergency department.    If you have other questions or have other symptoms or questions you are concerned about then please make a virtual visit  Covid symptoms such as fatigue and cough can linger over 2 weeks, even after the initial fever, aches, chills, and other initial symptoms.   Self Quarantine: The CDC, Centers for Disease Control has recommended a self quarantine of 5 days from the start of your illness until you are symptom-free including at least 24 hours of no symptoms including no fever, no shortness of breath, and no body aches and chills, by day 5 before returning to work or general contact with the public.  What does self quarantine mean: avoiding contact with people as much as possible.   Particularly in  your house, isolate your self from others in a separate room, wear a mask when possible in the room, particularly if coughing a lot.   Have others bring food, water, medications, etc., to your door, but avoid direct contact with your household contacts during this time to avoid spreading the infection to them.   If you have a separate bathroom and living quarters during the next 2 weeks away from others, that would be preferable.    If you can't completely isolate, then wear a mask, wash hands frequently with  soap and water for at least 15 seconds, minimize close contact with others, and have a friend or family member check regularly from a distance to make sure you are not getting seriously worse.     You should not be going out in public, should not be going to stores, to work or other public places until all your symptoms have resolved and at least 5 days + 24 hours of no symptoms at all have transpired.   Ideally you should avoid contact with others for a full 5 days if possible.  One of the goals is to limit spread to high risk people; people that are older and elderly, people with multiple health issues like diabetes, heart disease, lung disease, and anybody that has weakened immune systems such as people with cancer or on immunosuppressive therapy.   Tzirel was seen today for covid exposure, nasal congestion, cough and dizziness.  Diagnoses and all orders for this visit:  Close exposure to COVID-19 virus  Cough  Sinus pressure  Body aches  Other orders -     promethazine-dextromethorphan (PROMETHAZINE-DM) 6.25-15 MG/5ML syrup; Take 5 mLs by mouth 4 (four) times daily as needed for cough. -     molnupiravir EUA 200 mg CAPS; Take 4 capsules (800 mg total) by mouth 2 (two) times daily for 5 days.  F/u prn

## 2021-07-13 LAB — NOVEL CORONAVIRUS, NAA: SARS-CoV-2, NAA: NOT DETECTED

## 2021-07-13 LAB — SARS-COV-2, NAA 2 DAY TAT

## 2021-09-12 ENCOUNTER — Other Ambulatory Visit: Payer: Self-pay

## 2021-09-12 NOTE — Patient Outreach (Signed)
[facility]) Care Management  09/12/2021  [name] 1946/03/08 [id number]   Telephone Assessment     Successful outreach call to patient. Spoke briefly as patient headed to vaccine appt to get COVID booster shot. She denies any acute issues or concerns at present.    Goals Addressed               This Visit's Progress     (THN)Adequate Coping and Stress Mgmt (pt-stated)          Timeframe:  Long-Range Goal Priority:  High Start Date:  04/18/2021                           Expected End Date:  Dec 2022 Follow Up Date:Dec 2022   Barriers: Health Behaviors Knowledge                       -pt will verbalize at least 2-3 ways to manage stress -pt will seek mental health/counseling resources/assistance   Notes: 04/18/2021-Patient admits to family stressors that causes her to chain smoke. She is requesting some counseling/mental health resources and referral made to [place] also encouraged to follow up and discus with PCP.  07/11/21-Patient reports she has spoken with [place] SW who has also referred her to counseling Nurse, children's). Patient reports office called to make an appt but pt did not schedule due to increased co-pay($69/hr). Patient reports she will check with insurance for possible other in network options. Patient pleased to report that she has been using coping strategies and suggestions provided to her by Rn and SW and feels like they re helping. She is in better spirits, coping better and learning who to set boundaries with her family.   09/12/21-Did not get to address this call.       (THN)Track and Manage My Blood Pressure-Hypertension (pt-stated)        Timeframe:  Long-Range Goal Priority:  High Start Date:   04/18/2021                          Expected End Date: Dec 2022                      Follow Up Date Dec 2022   Barriers: Health Behaviors Knowledge  - check blood pressure weekly - write blood pressure results in a log or  diary    Why is this important?   You won't feel high blood pressure, but it can still hurt your blood vessels.  High blood pressure can cause heart or kidney problems. It can also cause a stroke.  Making lifestyle changes like losing a little weight or eating less salt will help.  Checking your blood pressure at home and at different times of the day can help to control blood pressure.  If the doctor prescribes medicine remember to take it the way the doctor ordered.  Call the office if you cannot afford the medicine or if there are questions about it.     Notes:   04/18/2021-Patient admits that she has BP machine in the home but does not check BP. Educated on importance of BP monitoring given taking BP meds and normal BP parameters. RN CM encouraged patient to begin checking BP at least  1-2x  a week. Patient reports adherence to low salt diet.   07/11/21-Patient admits that she has been slack concerning  BP monitoring in the home an has only been checking it about 1-2x/month. She reports she will try to be more consistent. She is unable to recall BP but reports she was told it was good at her last MD appt.   09/12/21-Patient reported BP readings have been within normal limits for her(130-140s). She is trying to check them more often-still to doing it daily but a few times a week.           Plan:  RN CM discussed with patient next outreach within the month of Dec. Patient agrees to care plan and follow up.  Enzo Montgomery, RN,BSN,CCM [facility] Telephonic Care Management Coordinator Direct Phone: [phone] Toll Free: [phone] Fax: [phone]

## 2021-10-15 DIAGNOSIS — C678 Malignant neoplasm of overlapping sites of bladder: Secondary | ICD-10-CM | POA: Diagnosis not present

## 2021-10-18 ENCOUNTER — Other Ambulatory Visit: Payer: Self-pay | Admitting: Urology

## 2021-10-18 NOTE — Patient Instructions (Addendum)
DUE TO COVID-19 ONLY ONE VISITOR IS ALLOWED TO COME WITH YOU AND STAY IN THE WAITING ROOM ONLY DURING PRE OP AND PROCEDURE.   **NO VISITORS ARE ALLOWED IN THE SHORT STAY AREA OR RECOVERY ROOM!!**  You are not required to quarantine, however you are required to wear a well-fitted mask when you are out and around people not in your household.  Hand Hygiene often Do NOT share personal items Notify your provider if you are in close contact with someone who has COVID or you develop fever 100.4 or greater, new onset of sneezing, cough, sore throat, shortness of breath or body aches.   Your procedure is scheduled on:  Wednesday, 10-30-21   Report to [facility] Main  Entrance     Report to admitting at 6:15 AM   Call this number if you have problems the morning of surgery [phone]   No solid food: After Midnight.   Can have clear liquids until 5:30 AM day of surgery  CLEAR LIQUID DIET  Foods Allowed                                                                     Foods Excluded  Water, Black Coffee (no milk/no creamer) and tea, regular and decaf                              liquids that you cannot  Plain Jell-O in any flavor  (No red)                         see through such as: Fruit ices (not with fruit pulp)                                 milk, soups, orange juice  Iced Popsicles (No red)                                    All solid food                             Apple juices Sports drinks like Gatorade (No red) Lightly seasoned clear broth or consume(fat free) Sugar    Oral Hygiene is also important to reduce your risk of infection.                                    Remember - BRUSH YOUR TEETH THE MORNING OF SURGERY WITH YOUR REGULAR TOOTHPASTE   Do NOT smoke after Midnight   Take these medicines the morning of surgery with A SIP OF WATER:  Tylenol, Atorvastatin   Plavix - to hold x5 days   Stop all vitamins and herbal supplements a week before  surgery              You may not have any metal on your body including hair pins, jewelry, and body piercing  Do not wear make-up, lotions, powders, perfumes or deodorant  Do not wear nail polish including gel and S&S, artificial/acrylic nails, or any other type of covering on natural nails including finger and toenails. If you have artificial nails, gel coating, etc. that needs to be removed by a nail salon please have this removed prior to surgery or surgery may need to be canceled/ delayed if the surgeon/ anesthesia feels like they are unable to be safely monitored.   Do not shave  48 hours prior to surgery.        Do not bring valuables to the hospital. [place].   Contacts, dentures or bridgework may not be worn into surgery.     Patients discharged the day of surgery will not be allowed to drive home.  Please read over the following fact sheets you were given: IF YOU HAVE QUESTIONS ABOUT YOUR PRE OP INSTRUCTIONS PLEASE CALL [facility] - Preparing for Surgery Before surgery, you can play an important role.  Because skin is not sterile, your skin needs to be as free of germs as possible.  You can reduce the number of germs on your skin by washing with CHG (chlorahexidine gluconate) soap before surgery.  CHG is an antiseptic cleaner which kills germs and bonds with the skin to continue killing germs even after washing. Please DO NOT use if you have an allergy to CHG or antibacterial soaps.  If your skin becomes reddened/irritated stop using the CHG and inform your nurse when you arrive at Short Stay. Do not shave (including legs and underarms) for at least 48 hours prior to the first CHG shower.  You may shave your face/neck.  Please follow these instructions carefully:  1.  Shower with CHG Soap the night before surgery and the  morning of surgery.  2.  If you choose to wash your hair, wash your hair first as usual with  your normal  shampoo.  3.  After you shampoo, rinse your hair and body thoroughly to remove the shampoo.                             4.  Use CHG as you would any other liquid soap.  You can apply chg directly to the skin and wash.  Gently with a scrungie or clean washcloth.  5.  Apply the CHG Soap to your body ONLY FROM THE NECK DOWN.   Do   not use on face/ open                           Wound or open sores. Avoid contact with eyes, ears mouth and   genitals (private parts).                       Wash face,  Genitals (private parts) with your normal soap.             6.  Wash thoroughly, paying special attention to the area where your    surgery  will be performed.  7.  Thoroughly rinse your body with warm water from the neck down.  8.  DO NOT shower/wash with your normal soap after using and rinsing off the CHG Soap.                9.  Pat yourself dry with  a clean towel.            10.  Wear clean pajamas.            11.  Place clean sheets on your bed the night of your first shower and do not  sleep with pets. Day of Surgery : Do not apply any lotions/deodorants the morning of surgery.  Please wear clean clothes to the hospital/surgery center.  FAILURE TO FOLLOW THESE INSTRUCTIONS MAY RESULT IN THE CANCELLATION OF YOUR SURGERY  PATIENT SIGNATURE_________________________________  NURSE SIGNATURE__________________________________  ________________________________________________________________________

## 2021-10-18 NOTE — Progress Notes (Addendum)
COVID swab appointment: N/A  COVID Vaccine Completed: Yes x2 Date COVID Vaccine completed: 02-17-20 03-12-20 Has received booster: Yes x2 10-05-20 03-21-21 COVID vaccine manufacturer: [place]      Date of COVID positive in last 90 days:  No  PCP - Jill Alexanders, MD Cardiologist - N/A  Chest x-ray - N/A EKG - 10-22-21 EKG Stress Test - greater than 2 years, Epic ECHO - greater than 2 years, Epic Cardiac Cath - N/A Pacemaker/ICD device last checked: Spinal Cord Stimulator:  Sleep Study - N/A CPAP -   Fasting Blood Sugar - N/A Checks Blood Sugar _____ times a day  Blood Thinner Instructions: Plavix.  To take last dose on 10-24-21 per patient. Aspirin Instructions: ASA 81 ok to stay on Last Dose:  Activity level:  Can go up a flight of stairs and perform activities of daily living without stopping and without symptoms of chest pain or shortness of breath.    Anesthesia review:  Bilateral carotid artery stenosis, Gastric AVM, PVD.  Hx of aortofemoral bypass.  HTN, hx of TIA  Patient denies shortness of breath, fever, cough and chest pain at PAT appointment  Patient verbalized understanding of instructions that were given to them at the PAT appointment. Patient was also instructed that they will need to review over the PAT instructions again at home before surgery.

## 2021-10-22 ENCOUNTER — Encounter (HOSPITAL_COMMUNITY)
Admission: RE | Admit: 2021-10-22 | Discharge: 2021-10-22 | Disposition: A | Discharge status: Home / Self Care | Payer: Medicare HMO | Source: Ambulatory Visit | Attending: Urology | Admitting: Urology

## 2021-10-22 ENCOUNTER — Other Ambulatory Visit: Payer: Self-pay

## 2021-10-22 ENCOUNTER — Encounter (HOSPITAL_COMMUNITY): Payer: Self-pay

## 2021-10-22 VITALS — BP 131/77 | HR 79 | Temp 98.0°F | Resp 18 | Ht 63.0 in | Wt 123.4 lb

## 2021-10-22 DIAGNOSIS — I10 Essential (primary) hypertension: Secondary | ICD-10-CM | POA: Insufficient documentation

## 2021-10-22 DIAGNOSIS — I739 Peripheral vascular disease, unspecified: Secondary | ICD-10-CM | POA: Diagnosis not present

## 2021-10-22 DIAGNOSIS — I2510 Atherosclerotic heart disease of native coronary artery without angina pectoris: Secondary | ICD-10-CM | POA: Diagnosis not present

## 2021-10-22 DIAGNOSIS — F17210 Nicotine dependence, cigarettes, uncomplicated: Secondary | ICD-10-CM | POA: Insufficient documentation

## 2021-10-22 DIAGNOSIS — I708 Atherosclerosis of other arteries: Secondary | ICD-10-CM | POA: Diagnosis not present

## 2021-10-22 DIAGNOSIS — Z8673 Personal history of transient ischemic attack (TIA), and cerebral infarction without residual deficits: Secondary | ICD-10-CM | POA: Diagnosis not present

## 2021-10-22 DIAGNOSIS — D649 Anemia, unspecified: Secondary | ICD-10-CM | POA: Diagnosis not present

## 2021-10-22 DIAGNOSIS — Z8551 Personal history of malignant neoplasm of bladder: Secondary | ICD-10-CM | POA: Diagnosis not present

## 2021-10-22 DIAGNOSIS — Z01818 Encounter for other preprocedural examination: Secondary | ICD-10-CM | POA: Diagnosis not present

## 2021-10-22 DIAGNOSIS — Z95820 Peripheral vascular angioplasty status with implants and grafts: Secondary | ICD-10-CM | POA: Insufficient documentation

## 2021-10-22 DIAGNOSIS — Z95828 Presence of other vascular implants and grafts: Secondary | ICD-10-CM | POA: Diagnosis not present

## 2021-10-22 DIAGNOSIS — R9431 Abnormal electrocardiogram [ECG] [EKG]: Secondary | ICD-10-CM | POA: Diagnosis not present

## 2021-10-22 DIAGNOSIS — D090 Carcinoma in situ of bladder: Secondary | ICD-10-CM | POA: Diagnosis not present

## 2021-10-22 HISTORY — DX: Unspecified osteoarthritis, unspecified site: M19.90

## 2021-10-22 HISTORY — DX: Malignant neoplasm of bladder, unspecified: C67.9

## 2021-10-22 LAB — BASIC METABOLIC PANEL
Anion gap: 10 (ref 5–15)
BUN: 14 mg/dL (ref 8–23)
CO2: 30 mmol/L (ref 22–32)
Calcium: 9.5 mg/dL (ref 8.9–10.3)
Chloride: 97 mmol/L — ABNORMAL LOW (ref 98–111)
Creatinine, Ser: 0.76 mg/dL (ref 0.44–1.00)
GFR, Estimated: >60 mL/min (ref >60)
Glucose, Bld: 82 mg/dL (ref 70–99)
Potassium: 3.5 mmol/L (ref 3.5–5.1)
Sodium: 137 mmol/L (ref 135–145)

## 2021-10-22 LAB — CBC
HCT: 41.8 % (ref 36.0–46.0)
Hemoglobin: 13.6 g/dL (ref 12.0–15.0)
MCH: 29.5 pg (ref 26.0–34.0)
MCHC: 32.5 g/dL (ref 30.0–36.0)
MCV: 90.7 fL (ref 80.0–100.0)
Platelets: 203 10*3/uL (ref 150–400)
RBC: 4.61 MIL/uL (ref 3.87–5.11)
RDW: 14.7 % (ref 11.5–15.5)
WBC: 4.8 10*3/uL (ref 4.0–10.5)
nRBC: 0 % (ref 0.0–0.2)

## 2021-10-22 NOTE — Progress Notes (Addendum)
Left message for Pam and Coni at Dr. Carlton Adam office regarding patient's Plavix.  Patient was supposed to stop on 10-19-21 but forgot and took her last dose on 10-21-21.  Awaiting call back.  Addendum:  Received a call back from [facility] at Dr. Carlton Adam office who stated that patient's surgery was moved to 10-30-21.

## 2021-10-22 NOTE — Progress Notes (Signed)
Spoke to patient and gave her updated arrival time of 6:15 for surgery on 10-30-21.  Patient stated she is to stop Plavix on 10-24-21.

## 2021-10-23 NOTE — Progress Notes (Signed)
Anesthesia Chart Review   Case: [id number] Date/Time: 10/30/21 0815   Procedure: CYSTOSCOPY WITH BLADDER BIOPSY AND FULGARATION BILATERAL RETROGRADE PYELOGRAM (Bilateral)   Anesthesia type: General   Pre-op diagnosis: RECURRENT BLADDER CARCINOMA INSITU   Location: [facility]   Surgeons: [name], MD       DISCUSSION:75 y.o. some day smoker with h/o HTN, PVD, s/p aorto-bifemoral bypass graft done by Dr. Trula Slade on 05/16/2011 for iliac occlusion and claudication, s/p left common femoral, profunda, and SFA endarterectomy of her left leg with patch angioplasty on 01/27/20, TIA, bladder cancer scheduled for above procedure 10/30/2021 with Dr. Louis Meckel.   Advised to hold Plavix 7 days prior to procedure.  VS: BP 131/77   Pulse 79   Temp 36.7 C (Oral)   Resp 18   Ht 5\' 3"  (1.6 m)   Wt 56 kg   SpO2 100%   BMI 21.86 kg/m   PROVIDERS: Denita Lung, MD is PCP    LABS: Labs reviewed: Acceptable for surgery. (all labs ordered are listed, but only abnormal results are displayed)  Labs Reviewed  BASIC METABOLIC PANEL - Abnormal; Notable for the following components:      Result Value   Chloride 97 (*)    All other components within normal limits  CBC     IMAGES: VAS [place] 06/04/2020 Summary:  Patent aorto-bifemoral bypass graft with no visualized stenosis. Known  right femoral artery occlusion disease per aortogram 01/10/2020. . The left  common femoral / profunda artery appears patent.      EKG: 10/22/2021 Rate 74 bpm  Normal sinus rhythm Abnormal ECG lead artifact No significant change since last tracing  CV: Stress Test 07/11/2015 A mild to moderate perfusion defect is seen in the Apical and Apical Lateral regions. This is consistent with an infarct/scar. There is no scintographic evidence of inducible myocardial ischemia.  The post-stress ejection fraction is 66%. Mild apical lateral hypocontractility.  Pharmacological stress test without  chest pain or EKG changes for ischemia.  This is a low risk scan. Clinical correlation recommended.  Echo 11/26/2012 - Left ventricle: The cavity size was normal. Wall thickness    was normal. Systolic function was normal. The estimated    ejection fraction was in the range of 55% to 60%. Doppler    parameters are consistent with abnormal left ventricular    relaxation (grade 1 diastolic dysfunction). The E/e' ratio    is <10, suggesting normal LV filling pressure.  - Mitral valve: Mildly thickened leaflets .  - Left atrium: LA Volume/BSA 24.1 ml/m2. The atrium was    normal in size.  - Systemic veins: The IVC measures <1.2 cm and collapses    spontaneously, suggesting volume depletion and a low RA    pressure of 2 mmHg.  Past Medical History:  Diagnosis Date   Abdominal wall hernia    Arthritis    Bladder cancer ([place])    Blood transfusion    30+ yrs. ago   Cancer ([place])    Right Breast   Hyperlipidemia    Hypertension    Peripheral vascular disease (HCC)    Seasonal allergies    TIA (transient ischemic attack)     Past Surgical History:  Procedure Laterality Date   ABDOMINAL AORTOGRAM W/LOWER EXTREMITY N/A 01/10/2020   Procedure: ABDOMINAL AORTOGRAM W/LOWER EXTREMITY;  Surgeon: Serafina Mitchell, MD;  Location: [facility];  Service: Cardiovascular;  Laterality: N/A;  Bilateral    ABDOMINAL  HYSTERECTOMY     Aortobifemoral BPG  05/15/11   Using a 14 x8 bifurcated Dacron Graft   BREAST SURGERY     right lumpectomy   BREAST SURGERY     multiple breast surgeries   CATARACT EXTRACTION Right 10/2018   COLONOSCOPY  2012   [place]   CYSTOSCOPY W/ RETROGRADES Bilateral 05/27/2019   Procedure: CYSTOSCOPY WITH BILATERAL RETROGRADE PYELOGRAM;  Surgeon: [name], MD;  [facility];  Service: Urology;  Laterality: Bilateral;   CYSTOSCOPY WITH BIOPSY N/A 05/27/2019   Procedure: CYSTOSCOPY WITH BLADDER  BIOPSY;  Surgeon: [name], MD;  Location[facility];   Service: Urology;  Laterality: N/A;   DILATION AND CURETTAGE OF UTERUS     3-4 in late 20"s   ENDARTERECTOMY FEMORAL Left 01/27/2020   REDO OF LEFT ENDARTERECTOMY FEMORAL (Left )   ENDARTERECTOMY FEMORAL Left 01/27/2020   Procedure: REDO OF LEFT ENDARTERECTOMY FEMORAL;  Surgeon: Serafina Mitchell, MD;  Location: [facility];  Service: Vascular;  Laterality: Left;   ENTEROSCOPY Left 04/10/2020   Procedure: ENTEROSCOPY;  Surgeon: Carol Ada, MD;  [facility];  Service: Endoscopy;  Laterality: Left;   HEMOSTASIS CLIP PLACEMENT  04/10/2020   Procedure: HEMOSTASIS CLIP PLACEMENT;  Surgeon: Carol Ada, MD;  [facility];  Service: Endoscopy;;  3 clips   HERNIA REPAIR     HOT HEMOSTASIS N/A 04/10/2020   Procedure: HOT HEMOSTASIS (ARGON PLASMA COAGULATION/BICAP);  Surgeon: Carol Ada, MD;  Location: [facility];  Service: Endoscopy;  Laterality: N/A;   NM MYOCAR PERF WALL MOTION  03/11/2011   mild to moderate perfusion defect in the apical and apical lateral region c/w an infarct/scar   PATCH ANGIOPLASTY Left 01/27/2020   Procedure: [place];  Surgeon: Serafina Mitchell, MD;  Location: [facility];  Service: Vascular;  Laterality: Left;   SUBMUCOSAL TATTOO INJECTION  04/10/2020   Procedure: SUBMUCOSAL TATTOO INJECTION;  Surgeon: Carol Ada, MD;  [facility];  Service: Endoscopy;;   VASCULAR SURGERY  05/15/11   aobifemoral bypass graft   VENTRAL HERNIA REPAIR  12/26/2011   Procedure: LAPAROSCOPIC VENTRAL HERNIA;  Surgeon: Adin Hector, MD;  [facility];  Service: General;  Laterality: N/A;  With Mesh    MEDICATIONS:  acetaminophen (TYLENOL) 500 MG tablet   aspirin EC 81 MG tablet   atorvastatin (LIPITOR) 20 MG tablet   Camphor-Eucalyptus-Menthol (VICKS VAPORUB) 4.73-1.2-2.6 % OINT   Cholecalciferol (VITAMIN D) 50 MCG (2000 UT) tablet   clopidogrel (PLAVIX) 75 MG tablet   hydrochlorothiazide (HYDRODIURIL) 25 MG  tablet   hydroxypropyl methylcellulose / hypromellose (ISOPTO TEARS / GONIOVISC) 2.5 % ophthalmic solution   hydrOXYzine (ATARAX/VISTARIL) 10 MG tablet   lactose free nutrition (BOOST) LIQD   mineral oil-hydrophilic petrolatum (AQUAPHOR) ointment   promethazine-dextromethorphan (PROMETHAZINE-DM) 6.25-15 MG/5ML syrup   triamcinolone cream (KENALOG) 0.1 %   No current facility-administered medications for this encounter.    Konrad Felix Ward, PA-C [facility] [phone]

## 2021-10-24 ENCOUNTER — Other Ambulatory Visit: Payer: Self-pay

## 2021-10-24 DIAGNOSIS — I2510 Atherosclerotic heart disease of native coronary artery without angina pectoris: Secondary | ICD-10-CM

## 2021-10-24 DIAGNOSIS — D649 Anemia, unspecified: Secondary | ICD-10-CM

## 2021-10-24 NOTE — Patient Outreach (Signed)
[facility]) Care Management  10/24/2021  [name] [birth year] [phone]   Telephone Assessment    Unsuccessful outreach attempt to patient.     Plan: RN CM will make outreach attempt to patient within the month of Jan.    [name] [name] [place] Management Telephonic Care Management Coordinator Direct Phone: [phone] Toll Free: [phone] Fax: [phone]

## 2021-10-29 NOTE — Anesthesia Preprocedure Evaluation (Addendum)
Anesthesia Evaluation  Patient identified by MRN, date of birth, ID band Patient awake    Reviewed: Allergy & Precautions, NPO status , Patient's Chart, lab work & pertinent test results  History of Anesthesia Complications Negative for: history of anesthetic complications  Airway Mallampati: II  TM Distance: >3 FB Neck ROM: Full    Dental  (+) Dental Advisory Given   Pulmonary Current Smoker and Patient abstained from smoking.,    Pulmonary exam normal        Cardiovascular hypertension, Pt. on medications + Peripheral Vascular Disease  Normal cardiovascular exam   Unremarkable echo 2014 Stress test 2016: Mild/mod perfusion defect c/w infarct/scar. No inducible ischemia. Stressed EF 66%, mild apical lateral hypo contractility. Low risk scan.    Neuro/Psych TIA   GI/Hepatic negative GI ROS, Neg liver ROS,   Endo/Other  negative endocrine ROS  Renal/GU negative Renal ROS   Recurrent bladder cancer    Musculoskeletal  (+) Arthritis ,   Abdominal   Peds  Hematology Plavix   Anesthesia Other Findings   Reproductive/Obstetrics                            Anesthesia Physical Anesthesia Plan  ASA: 3  Anesthesia Plan: General   Post-op Pain Management: Toradol IV (intra-op) and Tylenol PO (pre-op)   Induction: Intravenous  PONV Risk Score and Plan: 2 and Ondansetron, Dexamethasone, Midazolam and Treatment may vary due to age or medical condition  Airway Management Planned: LMA  Additional Equipment: None  Intra-op Plan:   Post-operative Plan: Extubation in OR  Informed Consent: I have reviewed the patients History and Physical, chart, labs and discussed the procedure including the risks, benefits and alternatives for the proposed anesthesia with the patient or authorized representative who has indicated his/her understanding and acceptance.     Dental advisory given  Plan  Discussed with:   Anesthesia Plan Comments:        Anesthesia Quick Evaluation

## 2021-10-30 ENCOUNTER — Ambulatory Visit (HOSPITAL_COMMUNITY): Payer: Medicare HMO | Admitting: Anesthesiology

## 2021-10-30 ENCOUNTER — Encounter (HOSPITAL_COMMUNITY)
Admission: RE | Disposition: A | Discharge status: Home / Self Care | Payer: Self-pay | Source: Ambulatory Visit | Attending: Urology

## 2021-10-30 ENCOUNTER — Ambulatory Visit (HOSPITAL_COMMUNITY): Payer: Medicare HMO | Admitting: Physician Assistant

## 2021-10-30 ENCOUNTER — Encounter (HOSPITAL_COMMUNITY): Payer: Self-pay | Admitting: Urology

## 2021-10-30 ENCOUNTER — Ambulatory Visit (HOSPITAL_COMMUNITY): Payer: Medicare HMO

## 2021-10-30 ENCOUNTER — Ambulatory Visit (HOSPITAL_COMMUNITY)
Admission: RE | Admit: 2021-10-30 | Discharge: 2021-10-30 | Disposition: A | Discharge status: Home / Self Care | Payer: Medicare HMO | Source: Ambulatory Visit | Attending: Urology | Admitting: Urology

## 2021-10-30 DIAGNOSIS — K649 Unspecified hemorrhoids: Secondary | ICD-10-CM | POA: Insufficient documentation

## 2021-10-30 DIAGNOSIS — C672 Malignant neoplasm of lateral wall of bladder: Secondary | ICD-10-CM | POA: Diagnosis not present

## 2021-10-30 DIAGNOSIS — I10 Essential (primary) hypertension: Secondary | ICD-10-CM | POA: Diagnosis not present

## 2021-10-30 DIAGNOSIS — N135 Crossing vessel and stricture of ureter without hydronephrosis: Secondary | ICD-10-CM | POA: Diagnosis not present

## 2021-10-30 DIAGNOSIS — K5900 Constipation, unspecified: Secondary | ICD-10-CM | POA: Diagnosis not present

## 2021-10-30 DIAGNOSIS — C679 Malignant neoplasm of bladder, unspecified: Secondary | ICD-10-CM

## 2021-10-30 DIAGNOSIS — C674 Malignant neoplasm of posterior wall of bladder: Secondary | ICD-10-CM | POA: Diagnosis not present

## 2021-10-30 DIAGNOSIS — E785 Hyperlipidemia, unspecified: Secondary | ICD-10-CM | POA: Diagnosis not present

## 2021-10-30 DIAGNOSIS — D649 Anemia, unspecified: Secondary | ICD-10-CM

## 2021-10-30 DIAGNOSIS — D509 Iron deficiency anemia, unspecified: Secondary | ICD-10-CM | POA: Diagnosis not present

## 2021-10-30 DIAGNOSIS — I2510 Atherosclerotic heart disease of native coronary artery without angina pectoris: Secondary | ICD-10-CM

## 2021-10-30 HISTORY — PX: CYSTOSCOPY WITH BIOPSY: SHX5122

## 2021-10-30 SURGERY — CYSTOSCOPY, WITH BIOPSY
Anesthesia: General | Site: Bladder | Laterality: Bilateral

## 2021-10-30 MED ORDER — ONDANSETRON HCL 4 MG/2ML IJ SOLN
INTRAMUSCULAR | Status: DC | PRN
  Administered 2021-10-30: 4 mg via INTRAVENOUS

## 2021-10-30 MED ORDER — ORAL CARE MOUTH RINSE: 15.0000 mL | Freq: Once | OROMUCOSAL | Status: AC

## 2021-10-30 MED ORDER — PHENAZOPYRIDINE HCL 200 MG PO TABS: 200.0000 mg | ORAL_TABLET | Freq: Three times a day (TID) | ORAL | 0 refills | Status: DC | PRN

## 2021-10-30 MED ORDER — AMISULPRIDE (ANTIEMETIC) 5 MG/2ML IV SOLN: 10.0000 mg | Freq: Once | INTRAVENOUS | Status: DC | PRN

## 2021-10-30 MED ORDER — STERILE WATER FOR IRRIGATION IR SOLN
Status: DC | PRN
  Administered 2021-10-30: 3000 mL

## 2021-10-30 MED ORDER — CHLORHEXIDINE GLUCONATE 0.12 % MT SOLN
15.0000 mL | Freq: Once | OROMUCOSAL | Status: AC
  Administered 2021-10-30: 07:00:00 15 mL via OROMUCOSAL

## 2021-10-30 MED ORDER — LACTATED RINGERS IV SOLN: INTRAVENOUS | Status: DC

## 2021-10-30 MED ORDER — LIDOCAINE 2% (20 MG/ML) 5 ML SYRINGE
INTRAMUSCULAR | Status: DC | PRN
Start: 2021-10-30 — End: 2021-10-30
  Administered 2021-10-30: 100 mg via INTRAVENOUS

## 2021-10-30 MED ORDER — PHENYLEPHRINE HCL (PRESSORS) 10 MG/ML IV SOLN
INTRAVENOUS | Status: AC
  Filled 2021-10-30: qty 1

## 2021-10-30 MED ORDER — KETOROLAC TROMETHAMINE 30 MG/ML IJ SOLN
INTRAMUSCULAR | Status: AC
  Filled 2021-10-30: qty 1

## 2021-10-30 MED ORDER — OXYCODONE HCL 5 MG/5ML PO SOLN: 5.0000 mg | Freq: Once | ORAL | Status: DC | PRN

## 2021-10-30 MED ORDER — LIDOCAINE HCL (PF) 2 % IJ SOLN
INTRAMUSCULAR | Status: AC
  Filled 2021-10-30: qty 5

## 2021-10-30 MED ORDER — FENTANYL CITRATE PF 50 MCG/ML IJ SOSY: 25.0000 ug | PREFILLED_SYRINGE | INTRAMUSCULAR | Status: DC | PRN

## 2021-10-30 MED ORDER — FENTANYL CITRATE (PF) 100 MCG/2ML IJ SOLN
INTRAMUSCULAR | Status: AC
  Filled 2021-10-30: qty 2

## 2021-10-30 MED ORDER — ONDANSETRON HCL 4 MG/2ML IJ SOLN
INTRAMUSCULAR | Status: AC
  Filled 2021-10-30: qty 2

## 2021-10-30 MED ORDER — PHENYLEPHRINE 40 MCG/ML (10ML) SYRINGE FOR IV PUSH (FOR BLOOD PRESSURE SUPPORT)
PREFILLED_SYRINGE | INTRAVENOUS | Status: AC
  Filled 2021-10-30: qty 10

## 2021-10-30 MED ORDER — KETOROLAC TROMETHAMINE 30 MG/ML IJ SOLN
INTRAMUSCULAR | Status: DC | PRN
  Administered 2021-10-30: 30 mg via INTRAVENOUS

## 2021-10-30 MED ORDER — CEFAZOLIN SODIUM-DEXTROSE 2-4 GM/100ML-% IV SOLN
2.0000 g | INTRAVENOUS | Status: AC
  Administered 2021-10-30: 09:00:00 2 g via INTRAVENOUS
  Filled 2021-10-30: qty 100

## 2021-10-30 MED ORDER — OXYCODONE HCL 5 MG PO TABS: 5.0000 mg | ORAL_TABLET | Freq: Once | ORAL | Status: DC | PRN

## 2021-10-30 MED ORDER — ONDANSETRON HCL 4 MG/2ML IJ SOLN: 4.0000 mg | Freq: Once | INTRAMUSCULAR | Status: DC | PRN

## 2021-10-30 MED ORDER — PHENYLEPHRINE 40 MCG/ML (10ML) SYRINGE FOR IV PUSH (FOR BLOOD PRESSURE SUPPORT)
PREFILLED_SYRINGE | INTRAVENOUS | Status: DC | PRN
  Administered 2021-10-30: 120 ug via INTRAVENOUS
  Administered 2021-10-30: 80 ug via INTRAVENOUS

## 2021-10-30 MED ORDER — FENTANYL CITRATE (PF) 100 MCG/2ML IJ SOLN
INTRAMUSCULAR | Status: DC | PRN
  Administered 2021-10-30 (×2): 25 ug via INTRAVENOUS

## 2021-10-30 MED ORDER — ACETAMINOPHEN 500 MG PO TABS: 1000.0000 mg | ORAL_TABLET | Freq: Once | ORAL | Status: DC

## 2021-10-30 MED ORDER — IOHEXOL 300 MG/ML  SOLN
INTRAMUSCULAR | Status: DC | PRN
  Administered 2021-10-30: 09:00:00 20 mL

## 2021-10-30 MED ORDER — PROPOFOL 10 MG/ML IV BOLUS
INTRAVENOUS | Status: AC
  Filled 2021-10-30: qty 20

## 2021-10-30 MED ORDER — TRAMADOL HCL 50 MG PO TABS: 50.0000 mg | ORAL_TABLET | Freq: Four times a day (QID) | ORAL | 0 refills | Status: DC | PRN

## 2021-10-30 MED ORDER — SODIUM CHLORIDE 0.9 % IR SOLN
Status: DC | PRN
  Administered 2021-10-30: 1000 mL

## 2021-10-30 MED ORDER — PROPOFOL 10 MG/ML IV BOLUS
INTRAVENOUS | Status: DC | PRN
  Administered 2021-10-30: 100 mg via INTRAVENOUS

## 2021-10-30 SURGICAL SUPPLY — 16 items
BAG URO CATCHER STRL LF (MISCELLANEOUS) ×2 IMPLANT
DRAPE FOOT SWITCH (DRAPES) ×2 IMPLANT
ELECT REM PT RETURN 15FT ADLT (MISCELLANEOUS) ×2 IMPLANT
GLOVE SURG ENC TEXT LTX SZ7.5 (GLOVE) ×2 IMPLANT
GOWN STRL REUS W/TWL XL LVL3 (GOWN DISPOSABLE) ×2 IMPLANT
GUIDEWIRE STR DUAL SENSOR (WIRE) ×1 IMPLANT
KIT TURNOVER KIT A (KITS) IMPLANT
LOOP CUT BIPOLAR 24F LRG (ELECTROSURGICAL) IMPLANT
MANIFOLD NEPTUNE II (INSTRUMENTS) ×2 IMPLANT
NDL SAFETY ECLIPSE 18X1.5 (NEEDLE) ×1 IMPLANT
NEEDLE HYPO 18GX1.5 SHARP (NEEDLE) ×2
PACK CYSTO (CUSTOM PROCEDURE TRAY) ×2 IMPLANT
SYR TOOMEY IRRIG 70ML (MISCELLANEOUS)
SYRINGE TOOMEY IRRIG 70ML (MISCELLANEOUS) IMPLANT
TUBING CONNECTING 10 (TUBING) ×2 IMPLANT
TUBING UROLOGY SET (TUBING) ×2 IMPLANT

## 2021-10-30 NOTE — Op Note (Signed)
Preoperative diagnosis:  Current bladder cancer, left posterior lateral wall, less than 2 cm  Postoperative diagnosis:  Same  Procedure: Cystoscopy, bilateral retrograde pyelogram with interpretation Transurethral resection of bladder tumor, 1.5 cm  Surgeon: [name], MD  Anesthesia: General  Complications: None  Intraoperative findings:  #1: Cystoscopy demonstrated a large capacity bladder with erythematous abnormal area on the posterior lateral wall on the left side.  This was a velvet-like appearance, consistent with CIS.  The left ureteral orifice was recessed from previous resections.  There was no other significant mucosal abnormalities or cystoscopic findings.  2.:  The left retrograde pyelogram was performed with 10 cc of Omnipaque contrast deviated and narrowing at the pelvic inlet with some mild dilation proximal to that with no hydroureteronephrosis or filling defects.  The calyces were sharp.  3.:  The patient's right retrograde pyelogram was initially performed with 10 cc of Omnipaque contrast.  This demonstrated a torturous ureter with some narrowing at the pelvic inlet consistent with the patient's left ureter.  Initially, I was concerned that there was a stone or some filling defect in the area where there was narrowing.  However, I advanced the 5 [place] open-ended catheter up to this area and repeated the retrograde pyelogram and did not see any additional filling defects or abnormalities.  The calyces were sharp.  There is no hydronephrosis.  EBL: Minimal  Specimens: Bladder tumor, left lateral wall  Indication: [name] is a 75 y.o. patient with history of high-grade transitional cell carcinoma and CIS, who was found to have an area of concern for recurrence on a routine 32-month cystoscopic surveillance procedure.  After reviewing the management options for treatment, he elected to proceed with the above surgical procedure(s). We have discussed the  potential benefits and risks of the procedure, side effects of the proposed treatment, the likelihood of the patient achieving the goals of the procedure, and any potential problems that might occur during the procedure or recuperation. Informed consent has been obtained.  Description of procedure:  The patient was taken to the operating room and general anesthesia was induced.  The patient was placed in the dorsal lithotomy position, prepped and draped in the usual sterile fashion, and preoperative antibiotics were administered. A preoperative time-out was performed.   21 French 30 degree cystoscope was gently passed to the patient's urethra and into the bladder under visual guidance.  Cystoscopy was performed in 3 to 6 degree manner with the above findings.  Subsequently used a 5 [place] abated ureteral catheter and performed bilateral retrograde pyelograms with the above findings.  I then exchanged the bridge for a rigid biopsy forceps.  I then biopsied approximately 1.5 cm of lesion on the left posterior lateral wall with the above findings.  Once the mucosa was completely resected in this area I used a monopolar [place] cautery to obtain hemostasis.  There was no additional bleeding or abnormalities.  The bladder was subsequently emptied and the scope removed.  The patient was subsequently extubated return the PACU in stable condition.  Disposition: The patient being discharged home from the PACU.  [name], M.D.

## 2021-10-30 NOTE — Anesthesia Procedure Notes (Signed)
Procedure Name: LMA Insertion Date/Time: 10/30/2021 8:45 AM Performed by: Maxwell Caul, CRNA Pre-anesthesia Checklist: Patient identified, Emergency Drugs available, Suction available and Patient being monitored Patient Re-evaluated:Patient Re-evaluated prior to induction Oxygen Delivery Method: Circle system utilized Preoxygenation: Pre-oxygenation with 100% oxygen Induction Type: Combination inhalational/ intravenous induction LMA: LMA with gastric port inserted LMA Size: 4.0 Number of attempts: 1 Placement Confirmation: positive ETCO2 and breath sounds checked- equal and bilateral Tube secured with: Tape Dental Injury: Teeth and Oropharynx as per pre-operative assessment

## 2021-10-30 NOTE — Discharge Instructions (Signed)
Transurethral Resection of Bladder Tumor (TURBT) or Bladder Biopsy   Definition:  Transurethral Resection of the Bladder Tumor is a surgical procedure used to diagnose and remove tumors within the bladder. TURBT is the most common treatment for early stage bladder cancer.  General instructions:     Your recent bladder surgery requires very little post hospital care but some definite precautions.  Despite the fact that no skin incisions were used, the area around the bladder incisions are raw and covered with scabs to promote healing and prevent bleeding. Certain precautions are needed to insure that the scabs are not disturbed over the next 2-4 weeks while the healing proceeds.  Because the raw surface inside your bladder and the irritating effects of urine you may expect frequency of urination and/or urgency (a stronger desire to urinate) and perhaps even getting up at night more often. This will usually resolve or improve slowly over the healing period. You may see some blood in your urine over the first 6 weeks. Do not be alarmed, even if the urine was clear for a while. Get off your feet and drink lots of fluids until clearing occurs. If you start to pass clots or don't improve call us.  Diet:  You may return to your normal diet immediately. Because of the raw surface of your bladder, alcohol, spicy foods, foods high in acid and drinks with caffeine may cause irritation or frequency and should be used in moderation. To keep your urine flowing freely and avoid constipation, drink plenty of fluids during the day (8-10 glasses). Tip: Avoid cranberry juice because it is very acidic.  Activity:  Your physical activity doesn't need to be restricted. However, if you are very active, you may see some blood in the urine. We suggest that you reduce your activity under the circumstances until the bleeding has stopped.  Bowels:  It is important to keep your bowels regular during the postoperative  period. Straining with bowel movements can cause bleeding. A bowel movement every other day is reasonable. Use a mild laxative if needed, such as milk of magnesia 2-3 tablespoons, or 2 Dulcolax tablets. Call if you continue to have problems. If you had been taking narcotics for pain, before, during or after your surgery, you may be constipated. Take a laxative if necessary.    Medication:  You should resume your pre-surgery medications unless told not to. In addition you may be given an antibiotic to prevent or treat infection. Antibiotics are not always necessary. All medication should be taken as prescribed until the bottles are finished unless you are having an unusual reaction to one of the drugs.  RESUME PLAVIX on Friday morning

## 2021-10-30 NOTE — Anesthesia Postprocedure Evaluation (Signed)
Anesthesia Post Note  Patient: SADEEN WIEGEL  Procedure(s) Performed: CYSTOSCOPY WITH BLADDER BIOPSY AND FULGARATION BILATERAL RETROGRADE PYELOGRAM (Bilateral: Bladder)     Patient location during evaluation: PACU Anesthesia Type: General Level of consciousness: awake and alert Pain management: pain level controlled Vital Signs Assessment: post-procedure vital signs reviewed and stable Respiratory status: spontaneous breathing, nonlabored ventilation and respiratory function stable Cardiovascular status: blood pressure returned to baseline and stable Postop Assessment: no apparent nausea or vomiting Anesthetic complications: no   No notable events documented.  Last Vitals:  Vitals:   10/30/21 0945 10/30/21 1000  BP: (!) 142/78 104/71  Pulse: 69   Resp: 16 14  Temp:    SpO2: 100%     Last Pain:  Vitals:   10/30/21 1000  TempSrc:   PainSc: 0-No pain                 Lidia Collum

## 2021-10-30 NOTE — Interval H&P Note (Signed)
History and Physical Interval Note:  10/30/2021 7:38 AM  [name]  has presented today for surgery, with the diagnosis of RECURRENT BLADDER CARCINOMA INSITU.  The various methods of treatment have been discussed with the patient and family. After consideration of risks, benefits and other options for treatment, the patient has consented to  Procedure(s): CYSTOSCOPY WITH BLADDER BIOPSY AND FULGARATION BILATERAL RETROGRADE PYELOGRAM (Bilateral) as a surgical intervention.  The patient's history has been reviewed, patient examined, no change in status, stable for surgery.  I have reviewed the patient's chart and labs.  Questions were answered to the patient's satisfaction.     [name]

## 2021-10-30 NOTE — H&P (Signed)
f/u for transitional cell carcinoma  HPI: [name] is a 75 year-old female established patient who is here for surveillance of bladder cancer.   She underwent a TURBT. Her last bladder tumor was resected approximately 01/25/2019. She has had the a total of 1 bladder resections. The patient had their first TURBT in approximately 01/26/2019.    Tumor Pathology: Superificial non-invasive TCC (areas of high grade dysplasia).    The patient did not have any post-operative bladder instillations.    She is not having pain in new locations. She has not had blood in her urine recently. She has recently had unwanted weight loss.    The patient was seen today on an urgent basis because she thought that she was having blood in her urine. This happened this morning as there was blood in the toilet. That time she had both urine and stool. The 2nd time she went to the bathroom in the morning she urinated in noted no blood. She then returned again and had blood in her stool. However, she still had the appointment and kept it.    The patient has not had any blood in her stool prior to this. She has intermittent constipation. She states that she has had foul-smelling gas recently as well as small stool balls. Her last colonoscopy was 2 years ago.    She denies any dysuria or gross hematuria.      ALLERGIES: Latex     MEDICATIONS: Aspirin  Hydrochlorothiazide 25 mg tablet  Cholecalciferol  Clopidogrel  Ketotifen Fumarate 0.025 % (0.035 %) drops  Prednisolone  Triamcinolone Acetonide 0.1 % cream      GU PSH: Cystoscopy - 12/23/2018 Cystoscopy TURBT <2 cm - 01/26/2019 Hysterectomy - about 1970      PSH Notes: Surgery for blockage    NON-GU PSH: Biopsy/remove Lymph Nodes, R breast  Cataract surgery - 12/11/2018     GU PMH: Bladder Cancer Lateral - 02/08/2019 Urinary Retention - 01/11/2019 Gross hematuria - 12/23/2018     NON-GU PMH: Cataract Hypercholesterolemia Malignant neoplasm of  unspecified site of unspecified female breast     FAMILY HISTORY: 1 son - Son Cancer - Other, Aunt, Mother father deceased - Runs in Family mother deceased - Runs in Family    SOCIAL HISTORY: Marital Status: Unknown Preferred Language: English; Ethnicity: Not Hispanic Or Latino; Race: Black or African American Current Smoking Status: Patient smokes. Has smoked since 12/18/1968. Smokes 1/2 pack per day.    Tobacco Use Assessment Completed: Used Tobacco in last 30 days? Does not use drugs. Drinks 1 caffeinated drink per day. Patient's occupation is/was Retired.     REVIEW OF SYSTEMS:    GU Review Female:   Patient denies frequent urination, hard to postpone urination, burning /pain with urination, get up at night to urinate, leakage of urine, stream starts and stops, trouble starting your stream, have to strain to urinate, and being pregnant.  Gastrointestinal (Upper):   Patient denies nausea, vomiting, and indigestion/ heartburn.  Gastrointestinal (Lower):   Patient reports constipation. Patient denies diarrhea.  Constitutional:   Patient denies fever, night sweats, weight loss, and fatigue.  Skin:   Patient denies skin rash/ lesion and itching.  Eyes:   Patient denies blurred vision and double vision.  Ears/ Nose/ Throat:   Patient denies sore throat and sinus problems.  Hematologic/Lymphatic:   Patient denies swollen glands and easy bruising.  Cardiovascular:   Patient denies leg swelling and chest pains.  Respiratory:   Patient denies cough  and shortness of breath.  Endocrine:   Patient denies excessive thirst.  Musculoskeletal:   Patient denies back pain and joint pain.  Neurological:   Patient denies headaches and dizziness.  Psychologic:   Patient denies depression and anxiety.    VITAL SIGNS:      05/06/2019 03:39 PM  Weight 130 lb / 58.97 kg  BP 86/54 mmHg  Pulse 123 /min  Temperature 98.9 F / 37.1 C    GU PHYSICAL EXAMINATION:    Digital Rectal Exam: Normal sphincter  tone. She has palpable internal hemorrhoids, and visible blood at the anus.  Urethral Meatus: Mild urethral meatal stenosis. Normal position. No discharge.   Vagina: Moderate vaginal atrophy, mild introital stenosis. No rectocele. No cystocele. No enterocele.     MULTI-SYSTEM PHYSICAL EXAMINATION:        PAST DATA REVIEWED:  Source Of History:  Patient  Records Review:   Pathology Reports, Previous Doctor Records, Previous Patient Records, POC Tool    PROCEDURES:         Flexible Cystoscopy - 52000  Risks, benefits, and some of the potential complications of the procedure were discussed at length with the patient including infection, bleeding, voiding discomfort, urinary retention, fever, chills, sepsis, and others. All questions were answered. Informed consent was obtained. Antibiotic prophylaxis was given. Sterile technique and intraurethral analgesia were used.  Meatus:  Normal size. Normal location. Normal condition.  Urethra:  No hypermobility. No leakage.  Ureteral Orifices:  Normal location. Normal size. Normal shape. Effluxed clear urine.  Bladder:  Over the patient's left lateral wall she has some erythema, flat appearing, and could be CIS. It also could be erythema from cystitis. I sent a urine culture and cytology.      The lower urinary tract was carefully examined. The procedure was well-tolerated and without complications. Antibiotic instructions were given. Instructions were given to call the office immediately for bloody urine, difficulty urinating, urinary retention, painful or frequent urination, fever, chills, nausea, vomiting or other illness. The patient stated that she understood these instructions and would comply with them.           Urinalysis w/Scope Dipstick Dipstick Cont'd Micro  Color: Yellow Bilirubin: Neg mg/dL WBC/hpf: 0 - 5/hpf  Appearance: Cloudy Ketones: Trace mg/dL RBC/hpf: 0 - 2/hpf  Specific Gravity: 1.030 Blood: Neg ery/uL Bacteria: Mod (26-50/hpf)   pH: 5.5 Protein: 1+ mg/dL Cystals: NS (Not Seen)  Glucose: Neg mg/dL Urobilinogen: 1.0 mg/dL Casts: Hyaline    Nitrites: Neg Trichomonas: Not Present    Leukocyte Esterase: Trace leu/uL Mucous: Present      Epithelial Cells: 6 - 10/hpf      Yeast: NS (Not Seen)      Sperm: Not Present      ASSESSMENT:      ICD-10 Details  1 GU:   Bladder Cancer Lateral - C67.2     PLAN:            Orders Labs Urine Culture, Urine Cytology            Schedule Return Visit/Planned Activity: 6 Months - Office Visit            Document Letter(s):  Created for Patient: Clinical Summary           Notes:   The patient had an area on the left lateral side wall of her bladder that was erythematous and flat, likely cystitis in nature but could potentially be CIS. I sent a urine cytology as well as  urine culture. We will keep in touch with her with the results. I would like her to return in 6 months given the abnormality, assuming that this is not high-grade CIS.    The patient had some blood in the rectal vault/anus. I also palpated hemorrhoids. I suspect this is the etiology of her bleeding. I did recommend that she touch base with her primary care provider on Monday. I suspect that she will get better, but discussed with her the the importance of going to the emergency department if she started to develop significant bleeding and weakness.

## 2021-10-30 NOTE — Transfer of Care (Signed)
Immediate Anesthesia Transfer of Care Note  Patient: [name]  Procedure(s) Performed: CYSTOSCOPY WITH BLADDER BIOPSY AND FULGARATION BILATERAL RETROGRADE PYELOGRAM (Bilateral: Bladder)  Patient Location: PACU  Anesthesia Type:General  Level of Consciousness: awake, alert  and oriented  Airway & Oxygen Therapy: Patient Spontanous Breathing and Patient connected to face mask oxygen  Post-op Assessment: Report given to RN and Post -op Vital signs reviewed and stable  Post vital signs: Reviewed and stable  Last Vitals:  Vitals Value Taken Time  BP 149/78 10/30/21 0930  Temp    Pulse 74 10/30/21 0932  Resp 19 10/30/21 0931  SpO2 100 % 10/30/21 0932  Vitals shown include unvalidated device data.  Last Pain:  Vitals:   10/30/21 0704  TempSrc: Oral  PainSc:          Complications: No notable events documented.

## 2021-10-31 ENCOUNTER — Encounter (HOSPITAL_COMMUNITY): Payer: Self-pay | Admitting: Urology

## 2021-10-31 LAB — SURGICAL PATHOLOGY

## 2021-11-27 DIAGNOSIS — Z5111 Encounter for antineoplastic chemotherapy: Secondary | ICD-10-CM | POA: Diagnosis not present

## 2021-11-27 DIAGNOSIS — C678 Malignant neoplasm of overlapping sites of bladder: Secondary | ICD-10-CM | POA: Diagnosis not present

## 2021-12-02 ENCOUNTER — Other Ambulatory Visit: Payer: Self-pay

## 2021-12-02 NOTE — Patient Outreach (Signed)
[facility]) Care Management  12/02/2021  [name] [birth year] [id number]   Telephone Assessment   Successful outreach call placed to patient. She shares that she is doing fairly well. She was recently told that her bladder CA has returned and worsening. She has had to start txs. She is coping fairly well. She has family support. She denies any RN CM needs or concerns at this time.  Medications Reviewed Today     Reviewed by Hayden Pedro, RN (Registered Nurse) on 12/02/21 at 1131  Med List Status: <None>   Medication Order Taking? Sig Documenting Provider Last Dose Status Informant  acetaminophen (TYLENOL) 500 MG tablet [id number] No Take 1,000 mg by mouth every 6 (six) hours as needed for mild pain or moderate pain. [provider] 10/30/2021 0430 Active Self  aspirin EC 81 MG tablet [id number] No Take 81 mg by mouth every evening.  [provider] Past Week Active Self           Med Note Tamala Julian, Verlin Dike Sep 22, 2019 11:24 AM)    atorvastatin (LIPITOR) 20 MG tablet [id number] No Take 1 tablet (20 mg total) by mouth daily. Denita Lung, MD 10/30/2021 Active Self  Camphor-Eucalyptus-Menthol (VICKS VAPORUB) 4.73-1.2-2.6 % OINT [id number] No Apply 1 application topically 3 (three) times daily as needed (leg discomfort). [provider] Past Week Active Self  Cholecalciferol (VITAMIN D) 50 MCG (2000 UT) tablet [id number] No Take 2,000 Units by mouth every evening.  [provider] Past Week Active Self  clopidogrel (PLAVIX) 75 MG tablet [id number] No TAKE 1 TABLET BY MOUTH EVERY DAY. RESUME ONCE YOU NO LONGER HAVE ANY BLEEDING IN YOUR URINE OR STOOL AND HAVE COMPLETED ALL SCHEDULED PROCEDURES Denita Lung, MD 10/24/2021 1900 Active Self  hydrochlorothiazide (HYDRODIURIL) 25 MG tablet [id number] No Take 1 tablet (25 mg total) by mouth daily. Denita Lung, MD 10/29/2021 Active Self  hydroxypropyl methylcellulose /  hypromellose (ISOPTO TEARS / GONIOVISC) 2.5 % ophthalmic solution [id number] No Place 1 drop into both eyes 3 (three) times daily as needed for dry eyes. [provider] Past Week Active Self  hydrOXYzine (ATARAX/VISTARIL) 10 MG tablet [id number] No Take 1 tablet (10 mg total) by mouth 3 (three) times daily as needed. For itching Tysinger, Camelia Eng, PA-C Past Week Active Self  lactose free nutrition (BOOST) LIQD [id number]  Take 237 mLs by mouth 3 (three) times a week. [provider]  Active Self  mineral oil-hydrophilic petrolatum (AQUAPHOR) ointment [id number] No Apply 1 application topically as needed for dry skin. [provider] Past Week Active Self  phenazopyridine (PYRIDIUM) 200 MG tablet [id number]  Take 1 tablet (200 mg total) by mouth 3 (three) times daily as needed for pain. [name], MD  Active   Discontinued 01/27/12 1412 (Error)   promethazine-dextromethorphan (PROMETHAZINE-DM) 6.25-15 MG/5ML syrup [id number] No Take 5 mLs by mouth 4 (four) times daily as needed for cough. Tysinger, Camelia Eng, PA-C More than a month Active Self  traMADol (ULTRAM) 50 MG tablet [id number]  Take 1-2 tablets (50-100 mg total) by mouth every 6 (six) hours as needed for moderate pain. [name], MD  Active   triamcinolone cream (KENALOG) 0.1 % [id number] No Apply 1 application topically 2 (two) times daily.  Patient not taking: Reported on 10/18/2021   Carlena Hurl, PA-C Not Taking Active Self             Care Plan : Hypertension (  Adult)  Updates made by Hayden Pedro, RN since 12/02/2021 12:00 AM     Problem: Disease Progression (Hypertension)      Long-Range Goal: Disease Progression Prevented or Minimized Completed 12/02/2021  Start Date: 04/18/2021  Expected End Date: 11/15/2021  Recent Progress: On track  Priority: High  Note:    Notes:   04/18/2021 RN CM discussed s/s of worsening condition and when to seek medical attention. -RN CM  educated patient on the importance of adhering to med regimen and assessed for any barriers.   -RN CM confirmed pt has action plan in place and knows when to seek medical attention. -RN CM reviewed low salt diet.  07/11/21 -RN CM educated patient on the importance of adhering to med regimen and assessed for any barriers.   -RN CM confirmed pt has action plan in place and knows when to seek medical attention. -RN CM reviewed low salt diet. -RN CM reviewed proper BP monitoring technique and tracking. 5/95/63-OVFIEPPIR due to duplicate care plan and goals     Task: Alleviate Barriers to Hypertension Treatment Completed 12/02/2021  Note:   Care Management Activities:    - healthy family lifestyle promoted - patient response to treatment assessed - reduction of dietary sodium encouraged - response to pharmacologic therapy monitored    Notes:     Care Plan : RN Care Manager POC  Updates made by Hayden Pedro, RN since 12/02/2021 12:00 AM     Problem: Chronic Disease Mgmt of Chronic Condition-HTN, bladder CA   Priority: High     Long-Range Goal: Development of POC for Mgmt of Chronic Condition-HTN   Start Date: 12/02/2021  Expected End Date: 12/02/2022  Priority: High  Note:   Current Barriers:  Chronic Disease Management support and education needs related to HTN   RNCM Clinical Goal(s):  Patient will verbalize understanding of plan for management of HTN as evidenced by mgmt of chronic conditions demonstrate Ongoing health management independence as evidenced by BP within normal parameters continue to work with RN Care Manager to address care management and care coordination needs related to  HTN as evidenced by adherence to CM Team Scheduled appointments through collaboration with RN Care manager, provider, and care team.   Interventions: POC sent to PCP upon initial assessment, quarterly and with any changes in patient's conditions Inter-disciplinary care team  collaboration (see longitudinal plan of care) Evaluation of current treatment plan related to  self management and patient's adherence to plan as established by provider   Hypertension Interventions:  (Status:  New goal.) Long Term Goal Last practice recorded BP readings:  BP Readings from Last 3 Encounters:  10/30/21 131/66  10/22/21 131/77  07/12/21 116/72  Most recent eGFR/CrCl:  Lab Results  Component Value Date   EGFR 77 03/07/2021    No components found for: CRCL  Evaluation of current treatment plan related to hypertension self management and patient's adherence to plan as established by provider Discussed plans with patient for ongoing care management follow up and provided patient with direct contact information for care management team Advised patient, providing education and rationale, to monitor blood pressure daily and record, calling PCP for findings outside established parameters Provided education on prescribed diet low salt  Patient Goals/Self-Care Activities: Take all medications as prescribed Attend all scheduled provider appointments Call provider office for new concerns or questions  check blood pressure 3 times per week write blood pressure results in a log or diary call doctor for signs and symptoms of  high blood pressure  Follow Up Plan:  Telephone follow up appointment with care management team member scheduled for:  within the month of Feb The patient has been provided with contact information for the care management team and has been advised to call with any health related questions or concerns.      Long-Range Goal: Development of POC for Mgmt of Chronic Condition-Bladder CA   Start Date: 12/02/2021  Expected End Date: 12/02/2022  Priority: High  Note:    Current Barriers:  Chronic Disease Management support and education needs related to bladder CA   RNCM Clinical Goal(s):  Patient will verbalize understanding of plan for management of cancer as  evidenced by adherence to tx plan continue to work with RN Care Manager to address care management and care coordination needs related to  cancer as evidenced by adherence to CM Team Scheduled appointments through collaboration with RN Care manager, provider, and care team.   Interventions: POC sent to PCP upon initial assessment, quarterly and with any changes in patient's conditions Inter-disciplinary care team collaboration (see longitudinal plan of care) Evaluation of current treatment plan related to  self management and patient's adherence to plan as established by provider   Oncology:  (Status: New goal.) Long Term Goal Assessment of understanding of oncology diagnosis:  Assessed patient understanding of cancer diagnosis and recommended treatment plan, Reviewed upcoming provider appointments and treatment appointments, and Assessed support system. Has consistent/reliable family or other support: Yes  Patient Goals/Self-Care Activities: Take all medications as prescribed Attend all scheduled provider appointments Perform all self care activities independently  Call provider office for new concerns or questions   Follow Up Plan:  Telephone follow up appointment with care management team member scheduled for:  within the month of Feb The patient has been provided with contact information for the care management team and has been advised to call with any health related questions or concerns.         Plan: RN CM discussed with patient next outreach within the month of Feb. Patient agrees to care plan and follow up. RN CM will send quarterly update to PCP.   Enzo Montgomery, RN,BSN,CCM [place] Management Telephonic Care Management Coordinator Direct Phone: [phone] Toll Free: [phone] Fax: [phone]

## 2021-12-05 DIAGNOSIS — C678 Malignant neoplasm of overlapping sites of bladder: Secondary | ICD-10-CM | POA: Diagnosis not present

## 2021-12-09 ENCOUNTER — Ambulatory Visit: Payer: Self-pay

## 2021-12-10 ENCOUNTER — Telehealth: Payer: Self-pay | Admitting: Family Medicine

## 2021-12-10 NOTE — Telephone Encounter (Signed)
Pt called and is wanting to know if you think she should take the medicine valacyclovir states her cancer dr is wanting her to start taking this medicine before her treating tomorrow

## 2021-12-11 DIAGNOSIS — Z5111 Encounter for antineoplastic chemotherapy: Secondary | ICD-10-CM | POA: Diagnosis not present

## 2021-12-11 DIAGNOSIS — C678 Malignant neoplasm of overlapping sites of bladder: Secondary | ICD-10-CM | POA: Diagnosis not present

## 2021-12-17 ENCOUNTER — Other Ambulatory Visit: Payer: Self-pay | Admitting: Family Medicine

## 2021-12-18 DIAGNOSIS — C678 Malignant neoplasm of overlapping sites of bladder: Secondary | ICD-10-CM | POA: Diagnosis not present

## 2021-12-18 DIAGNOSIS — Z5111 Encounter for antineoplastic chemotherapy: Secondary | ICD-10-CM | POA: Diagnosis not present

## 2021-12-26 DIAGNOSIS — C678 Malignant neoplasm of overlapping sites of bladder: Secondary | ICD-10-CM | POA: Diagnosis not present

## 2021-12-26 DIAGNOSIS — Z5111 Encounter for antineoplastic chemotherapy: Secondary | ICD-10-CM | POA: Diagnosis not present

## 2022-01-01 ENCOUNTER — Other Ambulatory Visit: Payer: Self-pay

## 2022-01-01 DIAGNOSIS — Z5111 Encounter for antineoplastic chemotherapy: Secondary | ICD-10-CM | POA: Diagnosis not present

## 2022-01-01 DIAGNOSIS — C678 Malignant neoplasm of overlapping sites of bladder: Secondary | ICD-10-CM | POA: Diagnosis not present

## 2022-01-01 NOTE — Patient Outreach (Signed)
[facility]) Care Management  01/01/2022  [name] 11-10-1946 [id number]   Telephone Assessment    Unsuccessful outreach attempt patient. No answer after multiple rings.      Plan: RN CM will make outreach attempt within the month of March if no return call.   Enzo Montgomery, RN,BSN,CCM [place] Management Telephonic Care Management Coordinator Direct Phone: [phone] Toll Free: [phone] Fax: [phone]

## 2022-01-06 ENCOUNTER — Ambulatory Visit: Payer: Self-pay

## 2022-01-08 DIAGNOSIS — Z5111 Encounter for antineoplastic chemotherapy: Secondary | ICD-10-CM | POA: Diagnosis not present

## 2022-01-08 DIAGNOSIS — C678 Malignant neoplasm of overlapping sites of bladder: Secondary | ICD-10-CM | POA: Diagnosis not present

## 2022-01-29 ENCOUNTER — Other Ambulatory Visit: Payer: Self-pay

## 2022-01-29 NOTE — Patient Outreach (Signed)
[facility]) Care Management ? ?01/29/2022 ? ?Laveda Norman ?1946/09/02 ?[id number] ? ? ?Telephone Assessment ? ? ? ?Unsuccessful outreach attempt to patient. RN CM left HIPAA compliant voicemail along with contact info.  ? ? ? ? ?Plan: ?RN CM will make outreach attempt to patient within the month of April. ? ?Enzo Montgomery, RN,BSN,CCM ?[facility] Care Management ?Telephonic Care Management Coordinator ?Direct Phone: [phone] ?Toll Free: [phone] ?Fax: [phone] ? ?

## 2022-03-03 ENCOUNTER — Other Ambulatory Visit: Payer: Self-pay

## 2022-03-03 NOTE — Patient Outreach (Signed)
[facility]) Care Management ? ?03/03/2022 ? ?[name] ?June 08, 1946 ?[id number] ? ? ?Telephone Assessment ?Annual Assessment ? ? ?Successful outreach call to patient. Spoke with patient who states she ash eben doing fairly well. She just completed first round of cancer txs-tolerated well. Appetite fair. Wgt stable. She continues to reside in her home along with supportive spouse. Denies any falls. No RN CM needs or concerns at this time. ? ?Medications Reviewed Today   ? ? Reviewed by Hayden Pedro, RN (Registered Nurse) on 03/03/22 at 1122  Med List Status: <None>  ? ?Medication Order Taking? Sig Documenting Provider Last Dose Status Informant  ?acetaminophen (TYLENOL) 500 MG tablet [id number] No Take 1,000 mg by mouth every 6 (six) hours as needed for mild pain or moderate pain. [provider] 10/30/2021 0430 Active Self  ?aspirin EC 81 MG tablet [id number] No Take 81 mg by mouth every evening.  [provider] Past Week Active Self  ?         ?Med Note Pincus Large Sep 22, 2019 11:24 AM)    ?atorvastatin (LIPITOR) 20 MG tablet [id number] No Take 1 tablet (20 mg total) by mouth daily. Denita Lung, MD 10/30/2021 Active Self  ?Camphor-Eucalyptus-Menthol (VICKS VAPORUB) 4.73-1.2-2.6 % OINT [id number] No Apply 1 application topically 3 (three) times daily as needed (leg discomfort). [provider] Past Week Active Self  ?Cholecalciferol (VITAMIN D) 50 MCG (2000 UT) tablet [id number] No Take 2,000 Units by mouth every evening.  [provider] Past Week Active Self  ?clopidogrel (PLAVIX) 75 MG tablet [id number]  TAKE 1 TABLET BY MOUTH EVERY DAY. RESUME 1 TIME YOU NO LONGER HAVE ANY BLEEDING IN YOUR URINE OR STOOL AND HAVE COMPLETED ALL SCHEDULED PROCEDURES Denita Lung, MD  Active   ?hydrochlorothiazide (HYDRODIURIL) 25 MG tablet [id number] No Take 1 tablet (25 mg total) by mouth daily. Denita Lung, MD 10/29/2021 Active Self   ?hydroxypropyl methylcellulose / hypromellose (ISOPTO TEARS / GONIOVISC) 2.5 % ophthalmic solution [id number] No Place 1 drop into both eyes 3 (three) times daily as needed for dry eyes. [provider] Past Week Active Self  ?hydrOXYzine (ATARAX/VISTARIL) 10 MG tablet [id number] No Take 1 tablet (10 mg total) by mouth 3 (three) times daily as needed. For itching Tysinger, Camelia Eng, PA-C Past Week Active Self  ?lactose free nutrition (BOOST) LIQD [id number]  Take 237 mLs by mouth 3 (three) times a week. [provider]  Active Self  ?mineral oil-hydrophilic petrolatum (AQUAPHOR) ointment [id number] No Apply 1 application topically as needed for dry skin. [provider] Past Week Active Self  ?phenazopyridine (PYRIDIUM) 200 MG tablet [id number]  Take 1 tablet (200 mg total) by mouth 3 (three) times daily as needed for pain. [name], MD  Active   ?Discontinued 01/27/12 1412 (Error)   ?promethazine-dextromethorphan (PROMETHAZINE-DM) 6.25-15 MG/5ML syrup [id number] No Take 5 mLs by mouth 4 (four) times daily as needed for cough. Tysinger, Camelia Eng, PA-C More than a month Active Self  ?traMADol (ULTRAM) 50 MG tablet [id number]  Take 1-2 tablets (50-100 mg total) by mouth every 6 (six) hours as needed for moderate pain. [name], MD  Active   ?triamcinolone cream (KENALOG) 0.1 % [id number] No Apply 1 application topically 2 (two) times daily.  ?Patient not taking: Reported on 10/18/2021  ? Tysinger, Camelia Eng, PA-C Not Taking Active Self  ? ?  ?  ? ?  ?  ? ?  03/03/2022  ? 11:07 AM 04/23/2021  ? 11:53 AM 04/18/2021  ?  3:12 PM 03/07/2021  ? 10:27 AM 05/25/2020  ? 11:16 AM  ?Depression screen PHQ 2/9  ?Decreased Interest 0 0 0 0 0  ?Down, Depressed, Hopeless 0 0 0 1 1  ?PHQ - 2 Score 0 0 0 1 1  ?  ?SDOH Screenings  ? ?Alcohol Screen: Low Risk   ? Last Alcohol Screening Score (AUDIT): 0  ?Depression (PHQ2-9): Low Risk   ? PHQ-2 Score: 0  ?Financial Resource Strain: Low Risk   ? Difficulty of  Paying Living Expenses: Not very hard  ?Food Insecurity: No Food Insecurity  ? Worried About Charity fundraiser in the Last Year: Never true  ? Ran Out of Food in the Last Year: Never true  ?Housing: Low Risk   ? Last Housing Risk Score: 0  ?Physical Activity: Inactive  ? Days of Exercise per Week: 0 days  ? Minutes of Exercise per Session: 0 min  ?Social Connections: Moderately Integrated  ? Frequency of Communication with Friends and Family: More than three times a week  ? Frequency of Social Gatherings with Friends and Family: More than three times a week  ? Attends Religious Services: 1 to 4 times per year  ? Active Member of Clubs or Organizations: No  ? Attends Archivist Meetings: Never  ? Marital Status: Married  ?Stress: Stress Concern Present  ? Feeling of Stress : To some extent  ?Tobacco Use: High Risk  ? Smoking Tobacco Use: Some Days  ? Smokeless Tobacco Use: Never  ? Passive Exposure: Not on file  ?Transportation Needs: No Transportation Needs  ? Lack of Transportation (Medical): No  ? Lack of Transportation (Non-Medical): No  ? ? ? ?  07/11/2021  ? 11:47 AM 09/12/2021  ?  9:34 AM 10/30/2021  ?  6:44 AM 12/02/2021  ? 11:33 AM 03/03/2022  ? 11:23 AM  ?Fall Risk  ?Falls in the past year? 0 0  0 0  ?Was there an injury with Fall? 0 0  0 0  ?Fall Risk Category Calculator 0 0  0 0  ?Fall Risk Category Low Low  Low Low  ?Patient Fall Risk Level Low fall risk Low fall risk Moderate fall risk Low fall risk Low fall risk  ?Patient at Risk for Falls Due to Medication side effect Medication side effect  Medication side effect Medication side effect  ?Fall risk Follow up Falls evaluation completed Falls evaluation completed  Falls evaluation completed Falls evaluation completed  ?  ? ?Care Plan : RN Care Manager POC  ?Updates made by Hayden Pedro, RN since 03/03/2022 12:00 AM  ?  ? ?Problem: Chronic Disease Mgmt of Chronic Condition-HTN, bladder CA   ?Priority: High  ?  ? ?Long-Range Goal:  Development of POC for Mgmt of Chronic Condition-HTN   ?Start Date: 12/02/2021  ?Expected End Date: 12/02/2022  ?This Visit's Progress: On track  ?Priority: High  ?Note:   ?Current Barriers:  ?Chronic Disease Management support and education needs related to HTN  ? ?RNCM Clinical Goal(s):  ?Patient will verbalize understanding of plan for management of HTN as evidenced by mgmt of chronic conditions ?demonstrate Ongoing health management independence as evidenced by BP within normal parameters ?continue to work with RN Care Manager to address care management and care coordination needs related to  HTN as evidenced by adherence to CM Team Scheduled appointments through collaboration with RN Care manager, provider, and  care team.  ? ?Interventions: ?POC sent to PCP upon initial assessment, quarterly and with any changes in patient's conditions ?Inter-disciplinary care team collaboration (see longitudinal plan of care) ?Evaluation of current treatment plan related to  self management and patient's adherence to plan as established by provider ? ? ?Hypertension Interventions:  (Status:  New goal.) Long Term Goal ?Last practice recorded BP readings:  ?BP Readings from Last 3 Encounters:  ?10/30/21 131/66  ?10/22/21 131/77  ?07/12/21 116/72  ?Most recent eGFR/CrCl:  ?Lab Results  ?Component Value Date  ? EGFR 77 03/07/2021  ?  No components found for: CRCL ? ?Evaluation of current treatment plan related to hypertension self management and patient's adherence to plan as established by provider ?Discussed plans with patient for ongoing care management follow up and provided patient with direct contact information for care management team ?Advised patient, providing education and rationale, to monitor blood pressure daily and record, calling PCP for findings outside established parameters ?Provided education on prescribed diet low salt ? ?03/03/22-Patient monitoring BP in home weekly-encouraged to do more often. Reports BP  readings WNL range.  ? ?Patient Goals/Self-Care Activities: ?Take all medications as prescribed ?Attend all scheduled provider appointments ?Call provider office for new concerns or questions  ?check blood pressure

## 2022-03-11 ENCOUNTER — Other Ambulatory Visit: Payer: Self-pay | Admitting: Family Medicine

## 2022-03-11 DIAGNOSIS — I70213 Atherosclerosis of native arteries of extremities with intermittent claudication, bilateral legs: Secondary | ICD-10-CM

## 2022-03-11 DIAGNOSIS — I739 Peripheral vascular disease, unspecified: Secondary | ICD-10-CM

## 2022-03-13 ENCOUNTER — Encounter: Payer: Self-pay | Admitting: Family Medicine

## 2022-03-13 ENCOUNTER — Ambulatory Visit (INDEPENDENT_AMBULATORY_CARE_PROVIDER_SITE_OTHER): Payer: Medicare HMO | Admitting: Family Medicine

## 2022-03-13 VITALS — BP 112/70 | HR 81 | Temp 97.9°F | Ht 61.5 in | Wt 120.4 lb

## 2022-03-13 DIAGNOSIS — C679 Malignant neoplasm of bladder, unspecified: Secondary | ICD-10-CM | POA: Diagnosis not present

## 2022-03-13 DIAGNOSIS — I6523 Occlusion and stenosis of bilateral carotid arteries: Secondary | ICD-10-CM

## 2022-03-13 DIAGNOSIS — Z9841 Cataract extraction status, right eye: Secondary | ICD-10-CM

## 2022-03-13 DIAGNOSIS — Z720 Tobacco use: Secondary | ICD-10-CM

## 2022-03-13 DIAGNOSIS — K31819 Angiodysplasia of stomach and duodenum without bleeding: Secondary | ICD-10-CM | POA: Diagnosis not present

## 2022-03-13 DIAGNOSIS — Z0000 Encounter for general adult medical examination without abnormal findings: Secondary | ICD-10-CM

## 2022-03-13 DIAGNOSIS — I10 Essential (primary) hypertension: Secondary | ICD-10-CM

## 2022-03-13 DIAGNOSIS — Z9842 Cataract extraction status, left eye: Secondary | ICD-10-CM

## 2022-03-13 DIAGNOSIS — J301 Allergic rhinitis due to pollen: Secondary | ICD-10-CM | POA: Diagnosis not present

## 2022-03-13 DIAGNOSIS — I739 Peripheral vascular disease, unspecified: Secondary | ICD-10-CM | POA: Diagnosis not present

## 2022-03-13 DIAGNOSIS — Z95828 Presence of other vascular implants and grafts: Secondary | ICD-10-CM

## 2022-03-13 DIAGNOSIS — M8580 Other specified disorders of bone density and structure, unspecified site: Secondary | ICD-10-CM

## 2022-03-13 DIAGNOSIS — Z853 Personal history of malignant neoplasm of breast: Secondary | ICD-10-CM | POA: Diagnosis not present

## 2022-03-13 DIAGNOSIS — I700 Atherosclerosis of aorta: Secondary | ICD-10-CM | POA: Diagnosis not present

## 2022-03-13 NOTE — Progress Notes (Signed)
? ?  Subjective:  ? ? Patient ID: [name], female    DOB: 31-Jan-1946, 76 y.o.   MRN: [id number] ? ?HPI ?She is here for complete examination.  She continues to be followed regularly by urology for her bladder cancer.  She also has a previous history of breast cancer in 2000.  She has also had aortofemoral bypass and review of record indicates need for referral back there for further consultation.  She also has a history of carotid stenosis but is asymptomatic.  Also has a history of aortic atherosclerosis.  She continues to smoke and is presently not interested in quitting.  She has had a colonoscopy which did show tubular adenoma and will need follow-up on that within the next couple of years.  She also has a previous history of gastric AVM but does not complain of any  black tarry stools. ?Otherwise her family and social history as well as health maintenance and immunizations was reviewed. ? ?Review of Systems  ?All other systems reviewed and are negative. ? ?   ?Objective:  ? Physical Exam ?Alert and in no distress. Tympanic membranes and canals are normal. Pharyngeal area is normal. Neck is supple without adenopathy or thyromegaly. Cardiac exam shows a regular sinus rhythm without murmurs or gallops. Lungs are clear to auscultation. ? ? ? ? ?   ?Assessment & Plan:  ?Routine general medical examination at a health care facility ? ?History of breast cancer ? ?Malignant neoplasm of urinary bladder, unspecified site [facility]) ? ?Allergic rhinitis due to pollen, unspecified seasonality ? ?Tobacco abuse ? ?Primary hypertension - Plan: CBC with Differential/Platelet, Comprehensive metabolic panel ? ?Asymptomatic bilateral carotid artery stenosis ? ?PVD (peripheral vascular disease) ([place]) - Plan: Ambulatory referral to Vascular Surgery, Lipid panel ? ?History of bilateral cataract extraction ? ?PAD (peripheral artery disease) ([place]) ? ?Gastric AVM ? ?History of aorto-femoral bypass - Plan: Ambulatory referral to  Vascular Surgery, Lipid panel ? ?Osteopenia, unspecified location - Plan: DG Bone Density ? ?Aortic atherosclerosis ([place]) - Plan: Lipid panel ? ? ?

## 2022-03-14 ENCOUNTER — Telehealth: Payer: Self-pay

## 2022-03-14 LAB — CBC WITH DIFFERENTIAL/PLATELET
Basophils Absolute: 0 10*3/uL (ref 0.0–0.2)
Basos: 1 %
EOS (ABSOLUTE): 0.1 10*3/uL (ref 0.0–0.4)
Eos: 1 %
Hematocrit: 37.3 % (ref 34.0–46.6)
Hemoglobin: 12.7 g/dL (ref 11.1–15.9)
Immature Grans (Abs): 0 10*3/uL (ref 0.0–0.1)
Immature Granulocytes: 0 %
Lymphocytes Absolute: 2.2 10*3/uL (ref 0.7–3.1)
Lymphs: 40 %
MCH: 29.5 pg (ref 26.6–33.0)
MCHC: 34 g/dL (ref 31.5–35.7)
MCV: 87 fL (ref 79–97)
Monocytes Absolute: 0.4 10*3/uL (ref 0.1–0.9)
Monocytes: 6 %
Neutrophils Absolute: 2.8 10*3/uL (ref 1.4–7.0)
Neutrophils: 52 %
Platelets: 220 10*3/uL (ref 150–450)
RBC: 4.31 x10E6/uL (ref 3.77–5.28)
RDW: 13 % (ref 11.7–15.4)
WBC: 5.5 10*3/uL (ref 3.4–10.8)

## 2022-03-14 LAB — LIPID PANEL
Chol/HDL Ratio: 1.9 ratio (ref 0.0–4.4)
Cholesterol, Total: 165 mg/dL (ref 100–199)
HDL: 86 mg/dL (ref >39)
LDL Chol Calc (NIH): 66 mg/dL (ref 0–99)
Triglycerides: 64 mg/dL (ref 0–149)
VLDL Cholesterol Cal: 13 mg/dL (ref 5–40)

## 2022-03-14 LAB — COMPREHENSIVE METABOLIC PANEL
ALT: 21 IU/L (ref 0–32)
AST: 33 IU/L (ref 0–40)
Albumin/Globulin Ratio: 1.3 (ref 1.2–2.2)
Albumin: 4.5 g/dL (ref 3.7–4.7)
Alkaline Phosphatase: 124 IU/L — ABNORMAL HIGH (ref 44–121)
BUN/Creatinine Ratio: 16 (ref 12–28)
BUN: 12 mg/dL (ref 8–27)
Bilirubin Total: 0.4 mg/dL (ref 0.0–1.2)
CO2: 22 mmol/L (ref 20–29)
Calcium: 9.6 mg/dL (ref 8.7–10.3)
Chloride: 94 mmol/L — ABNORMAL LOW (ref 96–106)
Creatinine, Ser: 0.77 mg/dL (ref 0.57–1.00)
Globulin, Total: 3.4 g/dL (ref 1.5–4.5)
Glucose: 83 mg/dL (ref 70–99)
Potassium: 3.2 mmol/L — ABNORMAL LOW (ref 3.5–5.2)
Sodium: 137 mmol/L (ref 134–144)
Total Protein: 7.9 g/dL (ref 6.0–8.5)
eGFR: 80 mL/min/{1.73_m2} (ref >59)

## 2022-03-14 NOTE — Telephone Encounter (Signed)
Pt want to advise she ist taking geritol and this may be why her stool are dark. [place] ?

## 2022-03-17 ENCOUNTER — Other Ambulatory Visit: Payer: Self-pay | Admitting: Family Medicine

## 2022-03-17 DIAGNOSIS — I10 Essential (primary) hypertension: Secondary | ICD-10-CM

## 2022-04-01 ENCOUNTER — Encounter: Payer: Self-pay | Admitting: Family Medicine

## 2022-04-01 ENCOUNTER — Ambulatory Visit (INDEPENDENT_AMBULATORY_CARE_PROVIDER_SITE_OTHER): Payer: Medicare HMO | Admitting: Family Medicine

## 2022-04-01 VITALS — BP 120/80 | HR 82 | Temp 97.2°F | Wt 117.6 lb

## 2022-04-01 DIAGNOSIS — L309 Dermatitis, unspecified: Secondary | ICD-10-CM

## 2022-04-01 DIAGNOSIS — C678 Malignant neoplasm of overlapping sites of bladder: Secondary | ICD-10-CM | POA: Diagnosis not present

## 2022-04-01 NOTE — Progress Notes (Signed)
? ?  Subjective:  ? ? Patient ID: [name], female    DOB: 1946-02-10, 76 y.o.   MRN: [id number] ? ?HPI ?She complains of a rash on her face left-sided greater than right.  No new soaps, detergents, colognes, medications.  She is in the process of being treated for bladder cancer with BCG. ? ? ?Review of Systems ? ?   ?Objective:  ? Physical Exam ?Alert and in no distress.  Exam of the face does show some slight swelling and minimal erythema.  No vesicles, pustules, drainage. ? ? ? ?   ?Assessment & Plan:  ?Dermatitis ?I explained that at this point there is no particular cause for this and so we will go with conservative care of cool compresses and cortisone cream.  If continued difficulty I will refer to dermatology. ? ?

## 2022-04-01 NOTE — Patient Instructions (Signed)
Use cortisone cream sparingly on your face once or twice per day for the itching.  Cold compresses can also help with the itching ?

## 2022-04-23 DIAGNOSIS — Z5111 Encounter for antineoplastic chemotherapy: Secondary | ICD-10-CM | POA: Diagnosis not present

## 2022-04-23 DIAGNOSIS — C678 Malignant neoplasm of overlapping sites of bladder: Secondary | ICD-10-CM | POA: Diagnosis not present

## 2022-04-30 DIAGNOSIS — Z5111 Encounter for antineoplastic chemotherapy: Secondary | ICD-10-CM | POA: Diagnosis not present

## 2022-04-30 DIAGNOSIS — C678 Malignant neoplasm of overlapping sites of bladder: Secondary | ICD-10-CM | POA: Diagnosis not present

## 2022-05-05 ENCOUNTER — Other Ambulatory Visit: Payer: Self-pay

## 2022-05-05 NOTE — Patient Outreach (Signed)
[facility]) Care Management  05/05/2022  JANNETH KRASNER May 13, 1946 [id number]   Telephone Assessment   Unsuccessful outreach attempt to patient. No answer. RN CM left HIPAA compliant voicemail message along with contact info.     Plan: RN CM will make outreach attempt within the month of Aug if no return call.  Enzo Montgomery, RN,BSN,CCM [facility] Telephonic Care Management Coordinator Direct Phone: [phone] Toll Free: [phone] Fax: [phone]

## 2022-05-07 DIAGNOSIS — Z5111 Encounter for antineoplastic chemotherapy: Secondary | ICD-10-CM | POA: Diagnosis not present

## 2022-05-07 DIAGNOSIS — C678 Malignant neoplasm of overlapping sites of bladder: Secondary | ICD-10-CM | POA: Diagnosis not present

## 2022-05-10 ENCOUNTER — Other Ambulatory Visit: Payer: Self-pay

## 2022-05-10 DIAGNOSIS — I739 Peripheral vascular disease, unspecified: Secondary | ICD-10-CM

## 2022-05-13 ENCOUNTER — Telehealth: Payer: Self-pay | Admitting: Family Medicine

## 2022-05-23 ENCOUNTER — Ambulatory Visit (INDEPENDENT_AMBULATORY_CARE_PROVIDER_SITE_OTHER): Payer: Medicare HMO

## 2022-05-23 VITALS — Ht 62.0 in | Wt 115.0 lb

## 2022-05-23 DIAGNOSIS — Z0000 Encounter for general adult medical examination without abnormal findings: Secondary | ICD-10-CM

## 2022-05-23 NOTE — Patient Instructions (Signed)
[name] , Thank you for taking time to come for your Medicare Wellness Visit. I appreciate your ongoing commitment to your health goals. Please review the following plan we discussed and let me know if I can assist you in the future.   Screening recommendations/referrals: Colonoscopy: completed 05/24/2019 Mammogram: patient to schedule Bone Density: scheduled for 09/23/2022 Recommended yearly ophthalmology/optometry visit for glaucoma screening and checkup Recommended yearly dental visit for hygiene and checkup  Vaccinations: Influenza vaccine: due 06/17/2022 Pneumococcal vaccine: completed 03/07/2021 Tdap vaccine: completed 02/03/2018, due 02/04/2028 Shingles vaccine: needs second dose   Covid-19: 09/12/2021, 03/21/2021, 10/05/2020, 03/12/2020, 02/17/2020  Advanced directives: Advance directive discussed with you today.   Conditions/risks identified: none  Next appointment: Follow up in one year for your annual wellness visit    Preventive Care 65 Years and Older, Female Preventive care refers to lifestyle choices and visits with your health care provider that can promote health and wellness. What does preventive care include? A yearly physical exam. This is also called an annual well check. Dental exams once or twice a year. Routine eye exams. Ask your health care provider how often you should have your eyes checked. Personal lifestyle choices, including: Daily care of your teeth and gums. Regular physical activity. Eating a healthy diet. Avoiding tobacco and drug use. Limiting alcohol use. Practicing safe sex. Taking low-dose aspirin every day. Taking vitamin and mineral supplements as recommended by your health care provider. What happens during an annual well check? The services and screenings done by your health care provider during your annual well check will depend on your age, overall health, lifestyle risk factors, and family history of disease. Counseling  Your health  care provider may ask you questions about your: Alcohol use. Tobacco use. Drug use. Emotional well-being. Home and relationship well-being. Sexual activity. Eating habits. History of falls. Memory and ability to understand (cognition). Work and work Statistician. Reproductive health. Screening  You may have the following tests or measurements: Height, weight, and BMI. Blood pressure. Lipid and cholesterol levels. These may be checked every 5 years, or more frequently if you are over 40 years old. Skin check. Lung cancer screening. You may have this screening every year starting at age 24 if you have a 30-pack-year history of smoking and currently smoke or have quit within the past 15 years. Fecal occult blood test (FOBT) of the stool. You may have this test every year starting at age 72. Flexible sigmoidoscopy or colonoscopy. You may have a sigmoidoscopy every 5 years or a colonoscopy every 10 years starting at age 18. Hepatitis C blood test. Hepatitis B blood test. Sexually transmitted disease (STD) testing. Diabetes screening. This is done by checking your blood sugar (glucose) after you have not eaten for a while (fasting). You may have this done every 1-3 years. Bone density scan. This is done to screen for osteoporosis. You may have this done starting at age 21. Mammogram. This may be done every 1-2 years. Talk to your health care provider about how often you should have regular mammograms. Talk with your health care provider about your test results, treatment options, and if necessary, the need for more tests. Vaccines  Your health care provider may recommend certain vaccines, such as: Influenza vaccine. This is recommended every year. Tetanus, diphtheria, and acellular pertussis (Tdap, Td) vaccine. You may need a Td booster every 10 years. Zoster vaccine. You may need this after age 54. Pneumococcal 13-valent conjugate (PCV13) vaccine. One dose is recommended after age  75. Pneumococcal polysaccharide (PPSV23) vaccine. One dose is recommended after age 107. Talk to your health care provider about which screenings and vaccines you need and how often you need them. This information is not intended to replace advice given to you by your health care provider. Make sure you discuss any questions you have with your health care provider. Document Released: 11/30/2015 Document Revised: 07/23/2016 Document Reviewed: 09/04/2015 Elsevier Interactive Patient Education  2017 [place] Prevention in the Home Falls can cause injuries. They can happen to people of all ages. There are many things you can do to make your home safe and to help prevent falls. What can I do on the outside of my home? Regularly fix the edges of walkways and driveways and fix any cracks. Remove anything that might make you trip as you walk through a door, such as a raised step or threshold. Trim any bushes or trees on the path to your home. Use bright outdoor lighting. Clear any walking paths of anything that might make someone trip, such as rocks or tools. Regularly check to see if handrails are loose or broken. Make sure that both sides of any steps have handrails. Any raised decks and porches should have guardrails on the edges. Have any leaves, snow, or ice cleared regularly. Use sand or salt on walking paths during winter. Clean up any spills in your garage right away. This includes oil or grease spills. What can I do in the bathroom? Use night lights. Install grab bars by the toilet and in the tub and shower. Do not use towel bars as grab bars. Use non-skid mats or decals in the tub or shower. If you need to sit down in the shower, use a plastic, non-slip stool. Keep the floor dry. Clean up any water that spills on the floor as soon as it happens. Remove soap buildup in the tub or shower regularly. Attach bath mats securely with double-sided non-slip rug tape. Do not have throw  rugs and other things on the floor that can make you trip. What can I do in the bedroom? Use night lights. Make sure that you have a light by your bed that is easy to reach. Do not use any sheets or blankets that are too big for your bed. They should not hang down onto the floor. Have a firm chair that has side arms. You can use this for support while you get dressed. Do not have throw rugs and other things on the floor that can make you trip. What can I do in the kitchen? Clean up any spills right away. Avoid walking on wet floors. Keep items that you use a lot in easy-to-reach places. If you need to reach something above you, use a strong step stool that has a grab bar. Keep electrical cords out of the way. Do not use floor polish or wax that makes floors slippery. If you must use wax, use non-skid floor wax. Do not have throw rugs and other things on the floor that can make you trip. What can I do with my stairs? Do not leave any items on the stairs. Make sure that there are handrails on both sides of the stairs and use them. Fix handrails that are broken or loose. Make sure that handrails are as long as the stairways. Check any carpeting to make sure that it is firmly attached to the stairs. Fix any carpet that is loose or worn. Avoid having throw rugs at the  top or bottom of the stairs. If you do have throw rugs, attach them to the floor with carpet tape. Make sure that you have a light switch at the top of the stairs and the bottom of the stairs. If you do not have them, ask someone to add them for you. What else can I do to help prevent falls? Wear shoes that: Do not have high heels. Have rubber bottoms. Are comfortable and fit you well. Are closed at the toe. Do not wear sandals. If you use a stepladder: Make sure that it is fully opened. Do not climb a closed stepladder. Make sure that both sides of the stepladder are locked into place. Ask someone to hold it for you, if  possible. Clearly mark and make sure that you can see: Any grab bars or handrails. First and last steps. Where the edge of each step is. Use tools that help you move around (mobility aids) if they are needed. These include: Canes. Walkers. Scooters. Crutches. Turn on the lights when you go into a dark area. Replace any light bulbs as soon as they burn out. Set up your furniture so you have a clear path. Avoid moving your furniture around. If any of your floors are uneven, fix them. If there are any pets around you, be aware of where they are. Review your medicines with your doctor. Some medicines can make you feel dizzy. This can increase your chance of falling. Ask your doctor what other things that you can do to help prevent falls. This information is not intended to replace advice given to you by your health care provider. Make sure you discuss any questions you have with your health care provider. Document Released: 08/30/2009 Document Revised: 04/10/2016 Document Reviewed: 12/08/2014 Elsevier Interactive Patient Education  2017 [employer].

## 2022-05-23 NOTE — Progress Notes (Signed)
I connected with [name]  today by telephone and verified that I am speaking with the correct person using two identifiers. Location patient: home Location provider: work Persons participating in the virtual visit: Palmina, [place] LPN.   I discussed the limitations, risks, security and privacy concerns of performing an evaluation and management service by telephone and the availability of in person appointments. I also discussed with the patient that there may be a patient responsible charge related to this service. The patient expressed understanding and verbally consented to this telephonic visit.    Interactive audio and video telecommunications were attempted between this provider and patient, however failed, due to patient having technical difficulties OR patient did not have access to video capability.  We continued and completed visit with audio only.     Vital signs may be patient reported or missing.  Subjective:   [name] is a 76 y.o. female who presents for Medicare Annual (Subsequent) preventive examination.  Review of Systems     Cardiac Risk Factors include: advanced age (>38mn, >>72women);hypertension     Objective:    Today's Vitals   05/23/22 0911  Weight: 115 lb (52.2 kg)  Height: '5\' 2"'$  (1.575 m)   Body mass index is 21.03 kg/m.     05/23/2022    9:25 AM 03/03/2022   11:20 AM 10/22/2021    8:23 AM 04/18/2021    3:46 PM 03/07/2021   10:27 AM 04/09/2020   12:35 AM 04/08/2020    6:28 PM  Advanced Directives  Does Patient Have a Medical Advance Directive? No No No No No  No  Would patient like information on creating a medical advance directive?  No - Patient declined No - Patient declined No - Patient declined Yes (ED - Information included in AVS) No - Patient declined     Current Medications (verified) Outpatient Encounter Medications as of 05/23/2022  Medication Sig   acetaminophen (TYLENOL) 500 MG tablet Take 1,000 mg by mouth  every 6 (six) hours as needed for mild pain or moderate pain.   aspirin EC 81 MG tablet Take 81 mg by mouth every evening.    atorvastatin (LIPITOR) 20 MG tablet TAKE 1 TABLET(20 MG) BY MOUTH DAILY   Camphor-Eucalyptus-Menthol (VICKS VAPORUB) 4.73-1.2-2.6 % OINT Apply 1 application topically 3 (three) times daily as needed (leg discomfort).   Cholecalciferol (VITAMIN D) 50 MCG (2000 UT) tablet Take 2,000 Units by mouth every evening.    clopidogrel (PLAVIX) 75 MG tablet TAKE 1 TABLET BY MOUTH EVERY DAY. RESUME 1 TIME YOU NO LONGER HAVE ANY BLEEDING IN YOUR URINE OR STOOL AND HAVE COMPLETED ALL SCHEDULED PROCEDURES   Docusate Calcium (STOOL SOFTENER PO) Stool softener   hydrochlorothiazide (HYDRODIURIL) 25 MG tablet TAKE 1 TABLET(25 MG) BY MOUTH DAILY   hydroxypropyl methylcellulose / hypromellose (ISOPTO TEARS / GONIOVISC) 2.5 % ophthalmic solution Place 1 drop into both eyes 3 (three) times daily as needed for dry eyes.   Iron-Vitamins (GERITOL PO) Take 20 mLs by mouth.   lactose free nutrition (BOOST) LIQD Take 237 mLs by mouth 3 (three) times a week.   mineral oil-hydrophilic petrolatum (AQUAPHOR) ointment Apply 1 application topically as needed for dry skin.   hydrOXYzine (ATARAX/VISTARIL) 10 MG tablet Take 1 tablet (10 mg total) by mouth 3 (three) times daily as needed. For itching (Patient not taking: Reported on 04/01/2022)   phenazopyridine (PYRIDIUM) 200 MG tablet Take 1 tablet (200 mg total) by mouth 3 (three) times daily  as needed for pain. (Patient not taking: Reported on 03/13/2022)   promethazine-dextromethorphan (PROMETHAZINE-DM) 6.25-15 MG/5ML syrup Take 5 mLs by mouth 4 (four) times daily as needed for cough. (Patient not taking: Reported on 03/13/2022)   traMADol (ULTRAM) 50 MG tablet Take 1-2 tablets (50-100 mg total) by mouth every 6 (six) hours as needed for moderate pain. (Patient not taking: Reported on 03/13/2022)   triamcinolone cream (KENALOG) 0.1 % Apply 1 application topically  2 (two) times daily. (Patient not taking: Reported on 10/18/2021)   valACYclovir (VALTREX) 1000 MG tablet Take 1,000 mg by mouth every 8 (eight) hours. (Patient not taking: Reported on 03/13/2022)   [DISCONTINUED] potassium chloride (K-DUR) 10 MEQ tablet Take 2 tablets (20 mEq total) by mouth 2 (two) times daily.   No facility-administered encounter medications on file as of 05/23/2022.    Allergies (verified) Latex   History: Past Medical History:  Diagnosis Date   Abdominal wall hernia    Arthritis    Bladder cancer ([place])    Blood transfusion    30+ yrs. ago   Cancer ([place])    Right Breast   Hyperlipidemia    Hypertension    Peripheral vascular disease (HCC)    Seasonal allergies    TIA (transient ischemic attack)    Past Surgical History:  Procedure Laterality Date   ABDOMINAL AORTOGRAM W/LOWER EXTREMITY N/A 01/10/2020   Procedure: ABDOMINAL AORTOGRAM W/LOWER EXTREMITY;  Surgeon: Serafina Mitchell, MD;  Location: [facility];  Service: Cardiovascular;  Laterality: N/A;  Bilateral    ABDOMINAL HYSTERECTOMY     Aortobifemoral BPG  05/15/11   Using a 14 x8 bifurcated Dacron Graft   BREAST SURGERY     right lumpectomy   BREAST SURGERY     multiple breast surgeries   CATARACT EXTRACTION Right 10/2018   COLONOSCOPY  2012   [place]   CYSTOSCOPY W/ RETROGRADES Bilateral 05/27/2019   Procedure: CYSTOSCOPY WITH BILATERAL RETROGRADE PYELOGRAM;  Surgeon: [name], MD;  Location: [facility];  Service: Urology;  Laterality: Bilateral;   CYSTOSCOPY WITH BIOPSY N/A 05/27/2019   Procedure: CYSTOSCOPY WITH BLADDER  BIOPSY;  Surgeon: [name], MD;  Location: [facility];  Service: Urology;  Laterality: N/A;   CYSTOSCOPY WITH BIOPSY Bilateral 10/30/2021   Procedure: CYSTOSCOPY WITH BLADDER BIOPSY AND FULGARATION BILATERAL RETROGRADE PYELOGRAM;  Surgeon: [name], MD;  Location: [facility];  Service: Urology;  Laterality: Bilateral;   DILATION AND CURETTAGE OF UTERUS      3-4 in late 20"s   ENDARTERECTOMY FEMORAL Left 01/27/2020   REDO OF LEFT ENDARTERECTOMY FEMORAL (Left )   ENDARTERECTOMY FEMORAL Left 01/27/2020   Procedure: REDO OF LEFT ENDARTERECTOMY FEMORAL;  Surgeon: Serafina Mitchell, MD;  Location: [facility];  Service: Vascular;  Laterality: Left;   ENTEROSCOPY Left 04/10/2020   Procedure: ENTEROSCOPY;  Surgeon: Carol Ada, MD;  [facility];  Service: Endoscopy;  Laterality: Left;   HEMOSTASIS CLIP PLACEMENT  04/10/2020   Procedure: HEMOSTASIS CLIP PLACEMENT;  Surgeon: Carol Ada, MD;  [facility];  Service: Endoscopy;;  3 clips   HERNIA REPAIR     HOT HEMOSTASIS N/A 04/10/2020   Procedure: HOT HEMOSTASIS (ARGON PLASMA COAGULATION/BICAP);  Surgeon: Carol Ada, MD;  Location: [facility];  Service: Endoscopy;  Laterality: N/A;   NM MYOCAR PERF WALL MOTION  03/11/2011   mild to moderate perfusion defect in the apical and apical lateral region c/w an infarct/scar   PATCH ANGIOPLASTY Left 01/27/2020   Procedure:  [place];  Surgeon: Serafina Mitchell, MD;  Location: [facility];  Service: Vascular;  Laterality: Left;   SUBMUCOSAL TATTOO INJECTION  04/10/2020   Procedure: SUBMUCOSAL TATTOO INJECTION;  Surgeon: Carol Ada, MD;  [facility];  Service: Endoscopy;;   VASCULAR SURGERY  05/15/11   aobifemoral bypass graft   VENTRAL HERNIA REPAIR  12/26/2011   Procedure: LAPAROSCOPIC VENTRAL HERNIA;  Surgeon: Adin Hector, MD;  Location: [facility];  Service: General;  Laterality: N/A;  With Mesh   Family History  Problem Relation Age of Onset   Heart attack Sister    Heart attack Brother    Stroke Sister    Cancer Mother        BREAST, BRAIN   Heart disease Mother    Cancer Paternal Aunt        breast ca   Cancer Paternal Grandmother        unknown ca   Social History   Socioeconomic History   Marital status: Married    Spouse name: Anastazia Creek   Number of children: Not on  file   Years of education: 12   Highest education level: 12th grade  Occupational History   Occupation: Retired  Tobacco Use   Smoking status: Some Days    Packs/day: 0.50    Years: 30.00    Total pack years: 15.00    Types: Cigarettes   Smokeless tobacco: Never   Tobacco comments:    pt states that she uses the smokeless cig and is trying to quit  Vaping Use   Vaping Use: Never used  Substance and Sexual Activity   Alcohol use: No    Comment: occasional alcohol intake   Drug use: No   Sexual activity: Not Currently    Birth control/protection: Surgical  Other Topics Concern   Not on file  Social History Narrative   Not on file   Social Determinants of Health   Financial Resource Strain: Low Risk  (05/23/2022)   Overall Financial Resource Strain (CARDIA)    Difficulty of Paying Living Expenses: Not hard at all  Food Insecurity: No Food Insecurity (05/23/2022)   Hunger Vital Sign    Worried About Running Out of Food in the Last Year: Never true    [place] in the Last Year: Never true  Transportation Needs: No Transportation Needs (05/23/2022)   PRAPARE - Hydrologist (Medical): No    Lack of Transportation (Non-Medical): No  Physical Activity: Inactive (05/23/2022)   Exercise Vital Sign    Days of Exercise per Week: 0 days    Minutes of Exercise per Session: 0 min  Stress: Stress Concern Present (05/23/2022)   [place]    Feeling of Stress : To some extent  Social Connections: Moderately Integrated (04/23/2021)   Social Connection and Isolation Panel [NHANES]    Frequency of Communication with Friends and Family: More than three times a week    Frequency of Social Gatherings with Friends and Family: More than three times a week    Attends Religious Services: 1 to 4 times per year    Active Member of [employer] or Organizations: No    Attends Archivist Meetings: Never     Marital Status: Married    Tobacco Counseling Ready to quit: No Counseling given: Not Answered Tobacco comments: pt states that she uses the smokeless cig and is  trying to quit   Clinical Intake:  Pre-visit preparation completed: Yes  Pain : No/denies pain     Nutritional Status: BMI of 19-24  Normal Nutritional Risks: None Diabetes: No  How often do you need to have someone help you when you read instructions, pamphlets, or other written materials from your doctor or pharmacy?: 1 - Never What is the last grade level you completed in school?: 12th grade  Diabetic? no  Interpreter Needed?: No  Information entered by :: NAllen LPN   Activities of Daily Living    05/23/2022    9:32 AM 03/03/2022   11:25 AM  In your present state of health, do you have any difficulty performing the following activities:  Hearing? 1 0  Vision? 0 0  Difficulty concentrating or making decisions? 0 0  Walking or climbing stairs? 1 0  Dressing or bathing? 0 0  Doing errands, shopping? 0 0  Preparing Food and eating ? N N  Using the Toilet? N N  In the past six months, have you accidently leaked urine? Y N  Do you have problems with loss of bowel control? N N  Managing your Medications? N N  Managing your Finances? N Y  Comment  spouse assists  Housekeeping or managing your Housekeeping? Aggie Moats  Comment  spouse assists    Patient Care Team: Denita Lung, MD as PCP - General (Family Medicine) Juanita Craver, MD (Gastroenterology) Lorretta Harp, MD (Cardiology) Serafina Mitchell, MD as Consulting Physician (Vascular Surgery) Heath Lark, MD as Consulting Physician (Hematology and Oncology) Florance, Tomasa Blase, RN as [place] any recent [place] you may have received from other than [facility] providers in the past year (date may be approximate).     Assessment:   This is a routine wellness examination for  [facility].  Hearing/Vision screen Vision Screening - Comments:: Regular eye exams, [facility]  Dietary issues and exercise activities discussed: Current Exercise Habits: The patient does not participate in regular exercise at present   Goals Addressed             This Visit's Progress    Patient Stated       05/23/2022, wants to gain weight       Depression Screen    05/23/2022    9:27 AM 03/03/2022   11:07 AM 04/23/2021   11:53 AM 04/18/2021    3:12 PM 03/07/2021   10:27 AM 05/25/2020   11:16 AM 02/19/2017   10:13 AM  PHQ 2/9 Scores  PHQ - 2 Score 0 0 0 0 1 1 0  PHQ- 9 Score 4          Fall Risk    05/23/2022    9:27 AM 03/03/2022   11:23 AM 12/02/2021   11:33 AM 09/12/2021    9:34 AM 07/11/2021   11:47 AM  Fall Risk   Falls in the past year? 0 0 0 0 0  Number falls in past yr: 0 0 0 0 0  Injury with Fall? 0 0 0 0 0  Risk for fall due to : Medication side effect Medication side effect Medication side effect Medication side effect Medication side effect  Follow up Falls evaluation completed;Education provided;Falls prevention discussed Falls evaluation completed Falls evaluation completed Falls evaluation completed Falls evaluation completed    FALL RISK PREVENTION PERTAINING TO THE HOME:  Any stairs in or around the home? Yes  If so, are there any  without handrails? No  Home free of loose throw rugs in walkways, pet beds, electrical cords, etc? Yes  Adequate lighting in your home to reduce risk of falls? Yes   ASSISTIVE DEVICES UTILIZED TO PREVENT FALLS:  Life alert? No  Use of a cane, walker or w/c? No  Grab bars in the bathroom? Yes  Shower chair or bench in shower? No  Elevated toilet seat or a handicapped toilet? Yes   TIMED UP AND GO:  Was the test performed? No .      Cognitive Function:        05/23/2022    9:35 AM  6CIT Screen  What Year? 0 points  What month? 0 points  What time? 0 points  Count back from 20 0 points  Months in reverse 0  points  Repeat phrase 6 points  Total Score 6 points    Immunizations Immunization History  Administered Date(s) Administered   Fluad Quad(high Dose 65+) 09/22/2019   Influenza Split 11/02/2014   Influenza, High Dose Seasonal PF 11/29/2015, 01/18/2018   Influenza-Unspecified 11/24/2018   PFIZER Comirnaty(Gray Top)Covid-19 Tri-Sucrose Vaccine 03/21/2021   PFIZER(Purple Top)SARS-COV-2 Vaccination 02/17/2020, 03/12/2020, 10/05/2020   Pfizer Covid-19 Vaccine Bivalent Booster 15yr & up 09/12/2021   Pneumococcal Conjugate-13 03/07/2021   Pneumococcal Polysaccharide-23 02/19/2017   Tdap 02/03/2018   Zoster Recombinat (Shingrix) 02/03/2022    TDAP status: Up to date  Flu Vaccine status: Up to date  Pneumococcal vaccine status: Up to date  Covid-19 vaccine status: Completed vaccines  Qualifies for Shingles Vaccine? Yes   Zostavax completed No   Shingrix Completed?: needs second dose  Screening Tests Health Maintenance  Topic Date Due   Zoster Vaccines- Shingrix (2 of 2) 03/31/2022   INFLUENZA VACCINE  06/17/2022   TETANUS/TDAP  02/04/2028   COLONOSCOPY (Pts 45-463yrInsurance coverage will need to be confirmed)  05/23/2029   Pneumonia Vaccine 6540Years old  Completed   DEXA SCAN  Completed   COVID-19 Vaccine  Completed   Hepatitis C Screening  Completed   HPV VACCINES  Aged Out    Health Maintenance  Health Maintenance Due  Topic Date Due   Zoster Vaccines- Shingrix (2 of 2) 03/31/2022    Colorectal cancer screening: Type of screening: Colonoscopy. Completed 05/24/2019. Repeat every 10 years  Mammogram status: patient to schedule  Bone Density status: scheduled for 09/23/2022  Lung Cancer Screening: (Low Dose CT Chest recommended if Age 75-80ears, 30 pack-year currently smoking OR have quit w/in 15years.) does not qualify.   Lung Cancer Screening Referral: no  Additional Screening:  Hepatitis C Screening: does qualify; Completed 11/29/2015  Vision Screening:  Recommended annual ophthalmology exams for early detection of glaucoma and other disorders of the eye. Is the patient up to date with their annual eye exam?  Yes  Who is the provider or what is the name of the office in which the patient attends annual eye exams? [place] pt is not established with a provider, would they like to be referred to a provider to establish care? No .   Dental Screening: Recommended annual dental exams for proper oral hygiene  Community Resource Referral / Chronic Care Management: CRR required this visit?  No   CCM required this visit?  No      Plan:     I have personally reviewed and noted the following in the patient's chart:   Medical and social history Use of alcohol, tobacco or illicit drugs  Current  medications and supplements including opioid prescriptions.  Functional ability and status Nutritional status Physical activity Advanced directives List of other physicians Hospitalizations, surgeries, and ER visits in previous 12 months Vitals Screenings to include cognitive, depression, and falls Referrals and appointments  In addition, I have reviewed and discussed with patient certain preventive protocols, quality metrics, and best practice recommendations. A written personalized care plan for preventive services as well as general preventive health recommendations were provided to patient.     Kellie Simmering, LPN   02/15/9621   Nurse Notes: none  Due to this being a virtual visit, the after visit summary with patients personalized plan was offered to patient via mail or my-chart. Patient would like to access on my-chart

## 2022-06-02 ENCOUNTER — Ambulatory Visit (HOSPITAL_COMMUNITY)
Admission: RE | Admit: 2022-06-02 | Discharge: 2022-06-02 | Disposition: A | Discharge status: Home / Self Care | Payer: Medicare HMO | Source: Ambulatory Visit | Attending: Surgery | Admitting: Surgery

## 2022-06-02 ENCOUNTER — Ambulatory Visit: Payer: Medicare HMO | Admitting: Physician Assistant

## 2022-06-02 ENCOUNTER — Ambulatory Visit (INDEPENDENT_AMBULATORY_CARE_PROVIDER_SITE_OTHER)
Admission: RE | Admit: 2022-06-02 | Discharge: 2022-06-02 | Disposition: A | Discharge status: Home / Self Care | Payer: Medicare HMO | Source: Ambulatory Visit | Attending: Surgery | Admitting: Surgery

## 2022-06-02 VITALS — BP 129/75 | HR 71 | Temp 98.0°F | Resp 20 | Ht 62.0 in | Wt 117.4 lb

## 2022-06-02 DIAGNOSIS — I739 Peripheral vascular disease, unspecified: Secondary | ICD-10-CM

## 2022-06-02 DIAGNOSIS — I70213 Atherosclerosis of native arteries of extremities with intermittent claudication, bilateral legs: Secondary | ICD-10-CM | POA: Diagnosis not present

## 2022-06-02 DIAGNOSIS — K5730 Diverticulosis of large intestine without perforation or abscess without bleeding: Secondary | ICD-10-CM | POA: Insufficient documentation

## 2022-06-02 DIAGNOSIS — I70221 Atherosclerosis of native arteries of extremities with rest pain, right leg: Secondary | ICD-10-CM | POA: Diagnosis not present

## 2022-06-02 DIAGNOSIS — R141 Gas pain: Secondary | ICD-10-CM | POA: Insufficient documentation

## 2022-06-02 DIAGNOSIS — Z860100 Personal history of colon polyps, unspecified: Secondary | ICD-10-CM | POA: Insufficient documentation

## 2022-06-02 DIAGNOSIS — K5900 Constipation, unspecified: Secondary | ICD-10-CM | POA: Insufficient documentation

## 2022-06-02 DIAGNOSIS — K8020 Calculus of gallbladder without cholecystitis without obstruction: Secondary | ICD-10-CM | POA: Insufficient documentation

## 2022-06-02 DIAGNOSIS — R634 Abnormal weight loss: Secondary | ICD-10-CM | POA: Insufficient documentation

## 2022-06-02 DIAGNOSIS — K31811 Angiodysplasia of stomach and duodenum with bleeding: Secondary | ICD-10-CM | POA: Insufficient documentation

## 2022-06-02 DIAGNOSIS — K625 Hemorrhage of anus and rectum: Secondary | ICD-10-CM | POA: Insufficient documentation

## 2022-06-02 DIAGNOSIS — K5904 Chronic idiopathic constipation: Secondary | ICD-10-CM | POA: Insufficient documentation

## 2022-06-02 DIAGNOSIS — R140 Abdominal distension (gaseous): Secondary | ICD-10-CM | POA: Insufficient documentation

## 2022-06-02 NOTE — Progress Notes (Signed)
[facility] [place] HISTORY AND PHYSICAL   History of Present Illness:  Patient is a 76 y.o. year old female who presents for evaluation of PAD.  presents for follow up of aorto-bifemoral bypass graft done by Dr. Trula Slade on 05/16/2011 for iliac occlusion and claudication.  She was lost to f/u for 7 years.  She was seen in Feb 2021 at that time she was having some claudication and rest pain like symptoms but no non healing wounds. She subsequently underwent Angiogram of her BLE that showed 70% stenosis of her left femoral anastomosis  With short segment occlusion of her proximal left SFA and occluded right SFA. She therefore underwent left common femoral, profunda, and SFA endarterectomy of her left leg with patch angioplasty on 01/27/20.  She presents today with continued rest pain over the past year on the right LE.  She states she can hang her right leg off the bed and get pain relief.  She denies claudication at short distances and non healing wounds.   The pt is on a statin for cholesterol management.  The pt is on a daily aspirin.   Other AC: Plavix The pt is on HCTZ, for hypertension.   The pt is not diabetic.  Tobacco hx:  Current smoker   Past Medical History:  Diagnosis Date   Abdominal wall hernia    Arthritis    Bladder cancer ([place])    Blood transfusion    30+ yrs. ago   Cancer ([place])    Right Breast   Hyperlipidemia    Hypertension    Peripheral vascular disease (HCC)    Seasonal allergies    TIA (transient ischemic attack)     Past Surgical History:  Procedure Laterality Date   ABDOMINAL AORTOGRAM W/LOWER EXTREMITY N/A 01/10/2020   Procedure: ABDOMINAL AORTOGRAM W/LOWER EXTREMITY;  Surgeon: Serafina Mitchell, MD;  Location: [facility];  Service: Cardiovascular;  Laterality: N/A;  Bilateral    ABDOMINAL HYSTERECTOMY     Aortobifemoral BPG  05/15/11   Using a 14 x8 bifurcated Dacron Graft   BREAST SURGERY     right lumpectomy   BREAST  SURGERY     multiple breast surgeries   CATARACT EXTRACTION Right 10/2018   COLONOSCOPY  2012   [facility]   CYSTOSCOPY W/ RETROGRADES Bilateral 05/27/2019   Procedure: CYSTOSCOPY WITH BILATERAL RETROGRADE PYELOGRAM;  Surgeon: [name], MD;  Location: [facility];  Service: Urology;  Laterality: Bilateral;   CYSTOSCOPY WITH BIOPSY N/A 05/27/2019   Procedure: CYSTOSCOPY WITH BLADDER  BIOPSY;  Surgeon: [name], MD;  Location: [facility];  Service: Urology;  Laterality: N/A;   CYSTOSCOPY WITH BIOPSY Bilateral 10/30/2021   Procedure: CYSTOSCOPY WITH BLADDER BIOPSY AND FULGARATION BILATERAL RETROGRADE PYELOGRAM;  Surgeon: [name], MD;  Location: [facility];  Service: Urology;  Laterality: Bilateral;   DILATION AND CURETTAGE OF UTERUS     3-4 in late 20"s   ENDARTERECTOMY FEMORAL Left 01/27/2020   REDO OF LEFT ENDARTERECTOMY FEMORAL (Left )   ENDARTERECTOMY FEMORAL Left 01/27/2020   Procedure: REDO OF LEFT ENDARTERECTOMY FEMORAL;  Surgeon: Serafina Mitchell, MD;  Location: [facility];  Service: Vascular;  Laterality: Left;   ENTEROSCOPY Left 04/10/2020   Procedure: ENTEROSCOPY;  Surgeon: Carol Ada, MD;  Location: [facility];  Service: Endoscopy;  Laterality: Left;   HEMOSTASIS CLIP PLACEMENT  04/10/2020   Procedure: HEMOSTASIS CLIP PLACEMENT;  Surgeon: Carol Ada, MD;  Location: [facility];  Service: Endoscopy;;  3 clips   HERNIA REPAIR     HOT HEMOSTASIS N/A 04/10/2020   Procedure: HOT HEMOSTASIS (ARGON PLASMA COAGULATION/BICAP);  Surgeon: Carol Ada, MD;  Location: [facility];  Service: Endoscopy;  Laterality: N/A;   NM MYOCAR PERF WALL MOTION  03/11/2011   mild to moderate perfusion defect in the apical and apical lateral region c/w an infarct/scar   PATCH ANGIOPLASTY Left 01/27/2020   Procedure: [place];  Surgeon: Serafina Mitchell, MD;  Location: [facility];  Service: Vascular;  Laterality: Left;   SUBMUCOSAL TATTOO INJECTION   04/10/2020   Procedure: SUBMUCOSAL TATTOO INJECTION;  Surgeon: Carol Ada, MD;  Location: [facility];  Service: Endoscopy;;   VASCULAR SURGERY  05/15/11   aobifemoral bypass graft   VENTRAL HERNIA REPAIR  12/26/2011   Procedure: LAPAROSCOPIC VENTRAL HERNIA;  Surgeon: Adin Hector, MD;  Location: [facility];  Service: General;  Laterality: N/A;  With Mesh    ROS:   General:  No weight loss, Fever, chills  HEENT: No recent headaches, no nasal bleeding, no visual changes, no sore throat  Neurologic: No dizziness, blackouts, seizures. No recent symptoms of stroke or mini- stroke. No recent episodes of slurred speech, or temporary blindness.  Cardiac: No recent episodes of chest pain/pressure, no shortness of breath at rest.  No shortness of breath with exertion.  Denies history of atrial fibrillation or irregular heartbeat  Vascular: No history of rest pain in feet.  No history of claudication.  No history of non-healing ulcer, No history of DVT   Pulmonary: No home oxygen, no productive cough, no hemoptysis,  No asthma or wheezing  Musculoskeletal:  '[ ]'$  Arthritis, '[ ]'$  Low back pain,  '[ ]'$  Joint pain  Hematologic:No history of hypercoagulable state.  No history of easy bleeding.  No history of anemia  Gastrointestinal: No hematochezia or melena,  No gastroesophageal reflux, no trouble swallowing  Urinary: '[ ]'$  chronic Kidney disease, '[ ]'$  on HD - '[ ]'$  MWF or '[ ]'$  TTHS, '[ ]'$  Burning with urination, '[ ]'$  Frequent urination, '[ ]'$  Difficulty urinating;   Skin: No rashes  Psychological: No history of anxiety,  No history of depression  Social History Social History   Tobacco Use   Smoking status: Some Days    Packs/day: 0.50    Years: 30.00    Total pack years: 15.00    Types: Cigarettes    Passive exposure: Never   Smokeless tobacco: Never   Tobacco comments:    pt states that she uses the smokeless cig and is trying to quit  Vaping Use   Vaping Use: Never used  Substance Use Topics    Alcohol use: No    Comment: occasional alcohol intake   Drug use: No    Family History Family History  Problem Relation Age of Onset   Heart attack Sister    Heart attack Brother    Stroke Sister    Cancer Mother        BREAST, BRAIN   Heart disease Mother    Cancer Paternal Aunt        breast ca   Cancer Paternal Grandmother        unknown ca    Allergies  Allergies  Allergen Reactions   Latex Rash     Current Outpatient Medications  Medication Sig Dispense Refill   acetaminophen (TYLENOL) 500 MG tablet Take 1,000 mg by mouth every 6 (six) hours as needed for mild pain  or moderate pain.     aspirin EC 81 MG tablet Take 81 mg by mouth every evening.      atorvastatin (LIPITOR) 20 MG tablet TAKE 1 TABLET(20 MG) BY MOUTH DAILY 90 tablet 1   Camphor-Eucalyptus-Menthol (VICKS VAPORUB) 4.73-1.2-2.6 % OINT Apply 1 application topically 3 (three) times daily as needed (leg discomfort).     Cholecalciferol (VITAMIN D) 50 MCG (2000 UT) tablet Take 2,000 Units by mouth every evening.      clopidogrel (PLAVIX) 75 MG tablet TAKE 1 TABLET BY MOUTH EVERY DAY. RESUME 1 TIME YOU NO LONGER HAVE ANY BLEEDING IN YOUR URINE OR STOOL AND HAVE COMPLETED ALL SCHEDULED PROCEDURES 90 tablet 1   Docusate Calcium (STOOL SOFTENER PO) Stool softener     hydrochlorothiazide (HYDRODIURIL) 25 MG tablet TAKE 1 TABLET(25 MG) BY MOUTH DAILY 90 tablet 1   hydroxypropyl methylcellulose / hypromellose (ISOPTO TEARS / GONIOVISC) 2.5 % ophthalmic solution Place 1 drop into both eyes 3 (three) times daily as needed for dry eyes.     hydrOXYzine (ATARAX/VISTARIL) 10 MG tablet Take 1 tablet (10 mg total) by mouth 3 (three) times daily as needed. For itching 30 tablet 0   Iron-Vitamins (GERITOL PO) Take 20 mLs by mouth.     lactose free nutrition (BOOST) LIQD Take 237 mLs by mouth 3 (three) times a week.     mineral oil-hydrophilic petrolatum (AQUAPHOR) ointment Apply 1 application topically as needed for dry skin.      phenazopyridine (PYRIDIUM) 200 MG tablet Take 1 tablet (200 mg total) by mouth 3 (three) times daily as needed for pain. 10 tablet 0   promethazine-dextromethorphan (PROMETHAZINE-DM) 6.25-15 MG/5ML syrup Take 5 mLs by mouth 4 (four) times daily as needed for cough. 120 mL 0   traMADol (ULTRAM) 50 MG tablet Take 1-2 tablets (50-100 mg total) by mouth every 6 (six) hours as needed for moderate pain. 15 tablet 0   triamcinolone cream (KENALOG) 0.1 % Apply 1 application topically 2 (two) times daily. 30 g 0   valACYclovir (VALTREX) 1000 MG tablet Take 1,000 mg by mouth every 8 (eight) hours.     No current facility-administered medications for this visit.    Physical Examination  Vitals:   06/02/22 1009  BP: 129/75  Pulse: 71  Resp: 20  Temp: 98 F (36.7 C)  TempSrc: Temporal  SpO2: 96%  Weight: 117 lb 6.4 oz (53.3 kg)  Height: '5\' 2"'$  (1.575 m)    Body mass index is 21.47 kg/m.  General:  Alert and oriented, no acute distress HEENT: Normal Neck: No bruit or JVD Pulmonary: Clear to auscultation bilaterally Cardiac: Regular Rate and Rhythm without murmur Abdomen: Soft, non-tender, non-distended, no mass Skin: No rash Extremity Pulses:  2+ radial,  femoral, left LE dorsalis pedis pulses  Musculoskeletal: No deformity or edema  Neurologic: Upper and lower extremity motor 5/5 and symmetric  DATA:      ABI Findings:  +---------+------------------+-----+----------+--------+  Right    Rt Pressure (mmHg)IndexWaveform  Comment   +---------+------------------+-----+----------+--------+  Brachial 136                                        +---------+------------------+-----+----------+--------+  PTA      82                0.60 monophasic          +---------+------------------+-----+----------+--------+  DP  88                0.65 monophasic          +---------+------------------+-----+----------+--------+  Great Toe53                0.39                      +---------+------------------+-----+----------+--------+   +---------+------------------+-----+--------+-------+  Left     Lt Pressure (mmHg)IndexWaveformComment  +---------+------------------+-----+--------+-------+  Brachial 121                                     +---------+------------------+-----+--------+-------+  PTA      129               0.95 biphasic         +---------+------------------+-----+--------+-------+  DP       126               0.93 biphasic         +---------+------------------+-----+--------+-------+  Great Toe41                0.30                  +---------+------------------+-----+--------+-------+   +-------+-----------+-----------+------------+------------+  ABI/TBIToday's ABIToday's TBIPrevious ABIPrevious TBI  +-------+-----------+-----------+------------+------------+  Right  0.65       0.39       0.67        0.48          +-------+-----------+-----------+------------+------------+  Left   0.95       0.3        0.77        0             +-------+-----------+-----------+------------+------------+      Previous ABI 11/16/19 prior to most recent intervention.     Summary:  Right: Resting right ankle-brachial index indicates moderate right lower  extremity arterial disease. The right toe-brachial index is abnormal.   Left: Resting left ankle-brachial index is within normal range. The left  toe-brachial index is abnormal.      Aorta Graft:  +------------------------+--------+--------+----------+--------------------  ----+                          PSV cm/sStenosisWaveform  Comments                    +------------------------+--------+--------+----------+--------------------  ----+  Inflow                  37              biphasic                              +------------------------+--------+--------+----------+--------------------  ----+  Prox anastomosis                                                               +------------------------+--------+--------+----------+--------------------  ----+  Proximal graft          44              biphasic                              +------------------------+--------+--------+----------+--------------------  ----+  Mid graft                                                                     +------------------------+--------+--------+----------+--------------------  ----+  Distal graft            76              monophasic                            +------------------------+--------+--------+----------+--------------------  ----+  Right Limb              42              monophasiclimited  visualization                                                       mid right limb              +------------------------+--------+--------+----------+--------------------  ----+  Right Distal anastomosis                                                      +------------------------+--------+--------+----------+--------------------  ----+  Right Outflow           50              monophasic                            +------------------------+--------+--------+----------+--------------------  ----+  Left Limb               42              biphasic                              +------------------------+--------+--------+----------+--------------------  ----+  Left Distal anastomosis                                                       +------------------------+--------+--------+----------+--------------------  ----+  Left Outflow            66              monophasic                            +------------------------+--------+--------+----------+--------------------  ----+      Summary:  Stenosis:  Patent aorto-bifemoral graft with no visualized stenosis.   ASSESSMENT:  PAD s/p Aortobifemoral bypass 2016 by Dr. Trula Slade She went on to  develop Stenosis within the left femoral anastomosis approximately 70%.  There is also a short segment occlusion of the proximal left superficial femoral artery.  This was  treated with left common femoral, profunda, and SFA endarterectomy of her left leg with patch angioplasty on 01/27/20.  This did improve her left LE symptoms.    Her studies are stable, however she does not have a palpable pedal pulse today on exam.  Her rest pain is more frequent and interfering with her daily activities.  She does have palpable femoral pulses B.     PLAN: I will schedule her for angiogram with possible right LE intervention.  She takes Plavix and ASA daily.  She is currently undergoing Bladder Cancer treatments with BCG.    Roxy Horseman PA-C [facility] Office: [phone]  MD in clinic [place]

## 2022-06-02 NOTE — H&P (View-Only) (Signed)
[facility] [place] HISTORY AND PHYSICAL   History of Present Illness:  Patient is a 76 y.o. year old female who presents for evaluation of PAD.  presents for follow up of aorto-bifemoral bypass graft done by Dr. Trula Slade on 05/16/2011 for iliac occlusion and claudication.  She was lost to f/u for 7 years.  She was seen in Feb 2021 at that time she was having some claudication and rest pain like symptoms but no non healing wounds. She subsequently underwent Angiogram of her BLE that showed 70% stenosis of her left femoral anastomosis  With short segment occlusion of her proximal left SFA and occluded right SFA. She therefore underwent left common femoral, profunda, and SFA endarterectomy of her left leg with patch angioplasty on 01/27/20.  She presents today with continued rest pain over the past year on the right LE.  She states she can hang her right leg off the bed and get pain relief.  She denies claudication at short distances and non healing wounds.   The pt is on a statin for cholesterol management.  The pt is on a daily aspirin.   Other AC: Plavix The pt is on HCTZ, for hypertension.   The pt is not diabetic.  Tobacco hx:  Current smoker   Past Medical History:  Diagnosis Date   Abdominal wall hernia    Arthritis    Bladder cancer ([place])    Blood transfusion    30+ yrs. ago   Cancer ([place])    Right Breast   Hyperlipidemia    Hypertension    Peripheral vascular disease (HCC)    Seasonal allergies    TIA (transient ischemic attack)     Past Surgical History:  Procedure Laterality Date   ABDOMINAL AORTOGRAM W/LOWER EXTREMITY N/A 01/10/2020   Procedure: ABDOMINAL AORTOGRAM W/LOWER EXTREMITY;  Surgeon: Serafina Mitchell, MD;  Location: [facility];  Service: Cardiovascular;  Laterality: N/A;  Bilateral    ABDOMINAL HYSTERECTOMY     Aortobifemoral BPG  05/15/11   Using a 14 x8 bifurcated Dacron Graft   BREAST SURGERY     right lumpectomy   BREAST  SURGERY     multiple breast surgeries   CATARACT EXTRACTION Right 10/2018   COLONOSCOPY  2012   [facility]   CYSTOSCOPY W/ RETROGRADES Bilateral 05/27/2019   Procedure: CYSTOSCOPY WITH BILATERAL RETROGRADE PYELOGRAM;  Surgeon: [name], MD;  Location: [facility];  Service: Urology;  Laterality: Bilateral;   CYSTOSCOPY WITH BIOPSY N/A 05/27/2019   Procedure: CYSTOSCOPY WITH BLADDER  BIOPSY;  Surgeon: [name], MD;  Location: [facility];  Service: Urology;  Laterality: N/A;   CYSTOSCOPY WITH BIOPSY Bilateral 10/30/2021   Procedure: CYSTOSCOPY WITH BLADDER BIOPSY AND FULGARATION BILATERAL RETROGRADE PYELOGRAM;  Surgeon: [name], MD;  Location: [facility];  Service: Urology;  Laterality: Bilateral;   DILATION AND CURETTAGE OF UTERUS     3-4 in late 20"s   ENDARTERECTOMY FEMORAL Left 01/27/2020   REDO OF LEFT ENDARTERECTOMY FEMORAL (Left )   ENDARTERECTOMY FEMORAL Left 01/27/2020   Procedure: REDO OF LEFT ENDARTERECTOMY FEMORAL;  Surgeon: Serafina Mitchell, MD;  Location: [facility];  Service: Vascular;  Laterality: Left;   ENTEROSCOPY Left 04/10/2020   Procedure: ENTEROSCOPY;  Surgeon: Carol Ada, MD;  Location: [facility];  Service: Endoscopy;  Laterality: Left;   HEMOSTASIS CLIP PLACEMENT  04/10/2020   Procedure: HEMOSTASIS CLIP PLACEMENT;  Surgeon: Carol Ada, MD;  Location: [facility];  Service: Endoscopy;;  3 clips   HERNIA REPAIR     HOT HEMOSTASIS N/A 04/10/2020   Procedure: HOT HEMOSTASIS (ARGON PLASMA COAGULATION/BICAP);  Surgeon: Carol Ada, MD;  Location: [facility];  Service: Endoscopy;  Laterality: N/A;   NM MYOCAR PERF WALL MOTION  03/11/2011   mild to moderate perfusion defect in the apical and apical lateral region c/w an infarct/scar   PATCH ANGIOPLASTY Left 01/27/2020   Procedure: [place];  Surgeon: Serafina Mitchell, MD;  Location: [facility];  Service: Vascular;  Laterality: Left;   SUBMUCOSAL TATTOO INJECTION   04/10/2020   Procedure: SUBMUCOSAL TATTOO INJECTION;  Surgeon: Carol Ada, MD;  Location: [facility];  Service: Endoscopy;;   VASCULAR SURGERY  05/15/11   aobifemoral bypass graft   VENTRAL HERNIA REPAIR  12/26/2011   Procedure: LAPAROSCOPIC VENTRAL HERNIA;  Surgeon: Adin Hector, MD;  Location: [facility];  Service: General;  Laterality: N/A;  With Mesh    ROS:   General:  No weight loss, Fever, chills  HEENT: No recent headaches, no nasal bleeding, no visual changes, no sore throat  Neurologic: No dizziness, blackouts, seizures. No recent symptoms of stroke or mini- stroke. No recent episodes of slurred speech, or temporary blindness.  Cardiac: No recent episodes of chest pain/pressure, no shortness of breath at rest.  No shortness of breath with exertion.  Denies history of atrial fibrillation or irregular heartbeat  Vascular: No history of rest pain in feet.  No history of claudication.  No history of non-healing ulcer, No history of DVT   Pulmonary: No home oxygen, no productive cough, no hemoptysis,  No asthma or wheezing  Musculoskeletal:  '[ ]'$  Arthritis, '[ ]'$  Low back pain,  '[ ]'$  Joint pain  Hematologic:No history of hypercoagulable state.  No history of easy bleeding.  No history of anemia  Gastrointestinal: No hematochezia or melena,  No gastroesophageal reflux, no trouble swallowing  Urinary: '[ ]'$  chronic Kidney disease, '[ ]'$  on HD - '[ ]'$  MWF or '[ ]'$  TTHS, '[ ]'$  Burning with urination, '[ ]'$  Frequent urination, '[ ]'$  Difficulty urinating;   Skin: No rashes  Psychological: No history of anxiety,  No history of depression  Social History Social History   Tobacco Use   Smoking status: Some Days    Packs/day: 0.50    Years: 30.00    Total pack years: 15.00    Types: Cigarettes    Passive exposure: Never   Smokeless tobacco: Never   Tobacco comments:    pt states that she uses the smokeless cig and is trying to quit  Vaping Use   Vaping Use: Never used  Substance Use Topics    Alcohol use: No    Comment: occasional alcohol intake   Drug use: No    Family History Family History  Problem Relation Age of Onset   Heart attack Sister    Heart attack Brother    Stroke Sister    Cancer Mother        BREAST, BRAIN   Heart disease Mother    Cancer Paternal Aunt        breast ca   Cancer Paternal Grandmother        unknown ca    Allergies  Allergies  Allergen Reactions   Latex Rash     Current Outpatient Medications  Medication Sig Dispense Refill   acetaminophen (TYLENOL) 500 MG tablet Take 1,000 mg by mouth every 6 (six) hours as needed for mild pain  or moderate pain.     aspirin EC 81 MG tablet Take 81 mg by mouth every evening.      atorvastatin (LIPITOR) 20 MG tablet TAKE 1 TABLET(20 MG) BY MOUTH DAILY 90 tablet 1   Camphor-Eucalyptus-Menthol (VICKS VAPORUB) 4.73-1.2-2.6 % OINT Apply 1 application topically 3 (three) times daily as needed (leg discomfort).     Cholecalciferol (VITAMIN D) 50 MCG (2000 UT) tablet Take 2,000 Units by mouth every evening.      clopidogrel (PLAVIX) 75 MG tablet TAKE 1 TABLET BY MOUTH EVERY DAY. RESUME 1 TIME YOU NO LONGER HAVE ANY BLEEDING IN YOUR URINE OR STOOL AND HAVE COMPLETED ALL SCHEDULED PROCEDURES 90 tablet 1   Docusate Calcium (STOOL SOFTENER PO) Stool softener     hydrochlorothiazide (HYDRODIURIL) 25 MG tablet TAKE 1 TABLET(25 MG) BY MOUTH DAILY 90 tablet 1   hydroxypropyl methylcellulose / hypromellose (ISOPTO TEARS / GONIOVISC) 2.5 % ophthalmic solution Place 1 drop into both eyes 3 (three) times daily as needed for dry eyes.     hydrOXYzine (ATARAX/VISTARIL) 10 MG tablet Take 1 tablet (10 mg total) by mouth 3 (three) times daily as needed. For itching 30 tablet 0   Iron-Vitamins (GERITOL PO) Take 20 mLs by mouth.     lactose free nutrition (BOOST) LIQD Take 237 mLs by mouth 3 (three) times a week.     mineral oil-hydrophilic petrolatum (AQUAPHOR) ointment Apply 1 application topically as needed for dry skin.      phenazopyridine (PYRIDIUM) 200 MG tablet Take 1 tablet (200 mg total) by mouth 3 (three) times daily as needed for pain. 10 tablet 0   promethazine-dextromethorphan (PROMETHAZINE-DM) 6.25-15 MG/5ML syrup Take 5 mLs by mouth 4 (four) times daily as needed for cough. 120 mL 0   traMADol (ULTRAM) 50 MG tablet Take 1-2 tablets (50-100 mg total) by mouth every 6 (six) hours as needed for moderate pain. 15 tablet 0   triamcinolone cream (KENALOG) 0.1 % Apply 1 application topically 2 (two) times daily. 30 g 0   valACYclovir (VALTREX) 1000 MG tablet Take 1,000 mg by mouth every 8 (eight) hours.     No current facility-administered medications for this visit.    Physical Examination  Vitals:   06/02/22 1009  BP: 129/75  Pulse: 71  Resp: 20  Temp: 98 F (36.7 C)  TempSrc: Temporal  SpO2: 96%  Weight: 117 lb 6.4 oz (53.3 kg)  Height: '5\' 2"'$  (1.575 m)    Body mass index is 21.47 kg/m.  General:  Alert and oriented, no acute distress HEENT: Normal Neck: No bruit or JVD Pulmonary: Clear to auscultation bilaterally Cardiac: Regular Rate and Rhythm without murmur Abdomen: Soft, non-tender, non-distended, no mass Skin: No rash Extremity Pulses:  2+ radial,  femoral, left LE dorsalis pedis pulses  Musculoskeletal: No deformity or edema  Neurologic: Upper and lower extremity motor 5/5 and symmetric  DATA:      ABI Findings:  +---------+------------------+-----+----------+--------+  Right    Rt Pressure (mmHg)IndexWaveform  Comment   +---------+------------------+-----+----------+--------+  Brachial 136                                        +---------+------------------+-----+----------+--------+  PTA      82                0.60 monophasic          +---------+------------------+-----+----------+--------+  DP  88                0.65 monophasic          +---------+------------------+-----+----------+--------+  Great Toe53                0.39                      +---------+------------------+-----+----------+--------+   +---------+------------------+-----+--------+-------+  Left     Lt Pressure (mmHg)IndexWaveformComment  +---------+------------------+-----+--------+-------+  Brachial 121                                     +---------+------------------+-----+--------+-------+  PTA      129               0.95 biphasic         +---------+------------------+-----+--------+-------+  DP       126               0.93 biphasic         +---------+------------------+-----+--------+-------+  Great Toe41                0.30                  +---------+------------------+-----+--------+-------+   +-------+-----------+-----------+------------+------------+  ABI/TBIToday's ABIToday's TBIPrevious ABIPrevious TBI  +-------+-----------+-----------+------------+------------+  Right  0.65       0.39       0.67        0.48          +-------+-----------+-----------+------------+------------+  Left   0.95       0.3        0.77        0             +-------+-----------+-----------+------------+------------+      Previous ABI 11/16/19 prior to most recent intervention.     Summary:  Right: Resting right ankle-brachial index indicates moderate right lower  extremity arterial disease. The right toe-brachial index is abnormal.   Left: Resting left ankle-brachial index is within normal range. The left  toe-brachial index is abnormal.      Aorta Graft:  +------------------------+--------+--------+----------+--------------------  ----+                          PSV cm/sStenosisWaveform  Comments                    +------------------------+--------+--------+----------+--------------------  ----+  Inflow                  37              biphasic                              +------------------------+--------+--------+----------+--------------------  ----+  Prox anastomosis                                                               +------------------------+--------+--------+----------+--------------------  ----+  Proximal graft          44              biphasic                              +------------------------+--------+--------+----------+--------------------  ----+  Mid graft                                                                     +------------------------+--------+--------+----------+--------------------  ----+  Distal graft            76              monophasic                            +------------------------+--------+--------+----------+--------------------  ----+  Right Limb              42              monophasiclimited  visualization                                                       mid right limb              +------------------------+--------+--------+----------+--------------------  ----+  Right Distal anastomosis                                                      +------------------------+--------+--------+----------+--------------------  ----+  Right Outflow           50              monophasic                            +------------------------+--------+--------+----------+--------------------  ----+  Left Limb               42              biphasic                              +------------------------+--------+--------+----------+--------------------  ----+  Left Distal anastomosis                                                       +------------------------+--------+--------+----------+--------------------  ----+  Left Outflow            66              monophasic                            +------------------------+--------+--------+----------+--------------------  ----+      Summary:  Stenosis:  Patent aorto-bifemoral graft with no visualized stenosis.   ASSESSMENT:  PAD s/p Aortobifemoral bypass 2016 by Dr. Trula Slade She went on to  develop Stenosis within the left femoral anastomosis approximately 70%.  There is also a short segment occlusion of the proximal left superficial femoral artery.  This was  treated with left common femoral, profunda, and SFA endarterectomy of her left leg with patch angioplasty on 01/27/20.  This did improve her left LE symptoms.    Her studies are stable, however she does not have a palpable pedal pulse today on exam.  Her rest pain is more frequent and interfering with her daily activities.  She does have palpable femoral pulses B.     PLAN: I will schedule her for angiogram with possible right LE intervention.  She takes Plavix and ASA daily.  She is currently undergoing Bladder Cancer treatments with BCG.    Roxy Horseman PA-C [facility] [place] Office: [phone]  MD in clinic [place]

## 2022-06-03 ENCOUNTER — Telehealth: Payer: Self-pay

## 2022-06-03 NOTE — Telephone Encounter (Signed)
Pt called stating that she had tests done yesterday and needed to know what to do now.  Reviewed pt's chart, returned pt's call, two identifiers used. Pt stated that she was having more pain and swelling than usual. She took some Advil and the pain improved. She was concerned about a blood clot, but she has been taking her Plavix. Reassured her that her symptoms were not indicative of a blood clot and informed her that the surgery scheduling RN would reach out soon. Confirmed understanding.

## 2022-06-06 ENCOUNTER — Other Ambulatory Visit: Payer: Self-pay

## 2022-06-06 DIAGNOSIS — I70221 Atherosclerosis of native arteries of extremities with rest pain, right leg: Secondary | ICD-10-CM

## 2022-06-18 ENCOUNTER — Other Ambulatory Visit: Payer: Self-pay

## 2022-06-24 ENCOUNTER — Ambulatory Visit (HOSPITAL_COMMUNITY)
Admission: RE | Admit: 2022-06-24 | Discharge: 2022-06-24 | Disposition: A | Discharge status: Home / Self Care | Payer: Medicare HMO | Attending: Surgery | Admitting: Surgery

## 2022-06-24 ENCOUNTER — Encounter (HOSPITAL_COMMUNITY)
Admission: RE | Disposition: A | Discharge status: Home / Self Care | Payer: Self-pay | Source: Home / Self Care | Attending: Surgery

## 2022-06-24 ENCOUNTER — Other Ambulatory Visit: Payer: Self-pay

## 2022-06-24 DIAGNOSIS — C679 Malignant neoplasm of bladder, unspecified: Secondary | ICD-10-CM | POA: Insufficient documentation

## 2022-06-24 DIAGNOSIS — Z95828 Presence of other vascular implants and grafts: Secondary | ICD-10-CM | POA: Diagnosis not present

## 2022-06-24 DIAGNOSIS — Z79899 Other long term (current) drug therapy: Secondary | ICD-10-CM | POA: Insufficient documentation

## 2022-06-24 DIAGNOSIS — I70221 Atherosclerosis of native arteries of extremities with rest pain, right leg: Secondary | ICD-10-CM | POA: Diagnosis not present

## 2022-06-24 DIAGNOSIS — Z7982 Long term (current) use of aspirin: Secondary | ICD-10-CM | POA: Insufficient documentation

## 2022-06-24 DIAGNOSIS — I70211 Atherosclerosis of native arteries of extremities with intermittent claudication, right leg: Secondary | ICD-10-CM | POA: Diagnosis not present

## 2022-06-24 DIAGNOSIS — I10 Essential (primary) hypertension: Secondary | ICD-10-CM | POA: Diagnosis not present

## 2022-06-24 DIAGNOSIS — F17210 Nicotine dependence, cigarettes, uncomplicated: Secondary | ICD-10-CM | POA: Insufficient documentation

## 2022-06-24 DIAGNOSIS — Z9889 Other specified postprocedural states: Secondary | ICD-10-CM | POA: Diagnosis not present

## 2022-06-24 DIAGNOSIS — Z7902 Long term (current) use of antithrombotics/antiplatelets: Secondary | ICD-10-CM | POA: Insufficient documentation

## 2022-06-24 HISTORY — PX: ABDOMINAL AORTOGRAM W/LOWER EXTREMITY: CATH118223

## 2022-06-24 LAB — POCT I-STAT, CHEM 8
BUN: 10 mg/dL (ref 8–23)
Calcium, Ion: 1.22 mmol/L (ref 1.15–1.40)
Chloride: 95 mmol/L — ABNORMAL LOW (ref 98–111)
Creatinine, Ser: 0.9 mg/dL (ref 0.44–1.00)
Glucose, Bld: 85 mg/dL (ref 70–99)
HCT: 44 % (ref 36.0–46.0)
Hemoglobin: 15 g/dL (ref 12.0–15.0)
Potassium: 2.9 mmol/L — ABNORMAL LOW (ref 3.5–5.1)
Sodium: 139 mmol/L (ref 135–145)
TCO2: 35 mmol/L — ABNORMAL HIGH (ref 22–32)

## 2022-06-24 SURGERY — ABDOMINAL AORTOGRAM W/LOWER EXTREMITY
Anesthesia: LOCAL

## 2022-06-24 MED ORDER — MIDAZOLAM HCL 2 MG/2ML IJ SOLN
INTRAMUSCULAR | Status: DC | PRN
  Administered 2022-06-24: 1 mg via INTRAVENOUS

## 2022-06-24 MED ORDER — HEPARIN (PORCINE) IN NACL 1000-0.9 UT/500ML-% IV SOLN
INTRAVENOUS | Status: DC | PRN
  Administered 2022-06-24 (×2): 500 mL

## 2022-06-24 MED ORDER — LABETALOL HCL 5 MG/ML IV SOLN: 10.0000 mg | INTRAVENOUS | Status: DC | PRN

## 2022-06-24 MED ORDER — MORPHINE SULFATE (PF) 2 MG/ML IV SOLN: 2.0000 mg | INTRAVENOUS | Status: DC | PRN

## 2022-06-24 MED ORDER — FENTANYL CITRATE (PF) 100 MCG/2ML IJ SOLN
INTRAMUSCULAR | Status: AC
  Filled 2022-06-24: qty 2

## 2022-06-24 MED ORDER — LIDOCAINE HCL (PF) 1 % IJ SOLN
INTRAMUSCULAR | Status: AC
  Filled 2022-06-24: qty 30

## 2022-06-24 MED ORDER — SODIUM CHLORIDE 0.9% FLUSH: 3.0000 mL | INTRAVENOUS | Status: DC | PRN

## 2022-06-24 MED ORDER — SODIUM CHLORIDE 0.9 % IV SOLN: INTRAVENOUS | Status: DC

## 2022-06-24 MED ORDER — ACETAMINOPHEN 325 MG PO TABS: 650.0000 mg | ORAL_TABLET | ORAL | Status: DC | PRN

## 2022-06-24 MED ORDER — FENTANYL CITRATE (PF) 100 MCG/2ML IJ SOLN
INTRAMUSCULAR | Status: DC | PRN
  Administered 2022-06-24: 50 ug via INTRAVENOUS

## 2022-06-24 MED ORDER — SODIUM CHLORIDE 0.9% FLUSH: 3.0000 mL | Freq: Two times a day (BID) | INTRAVENOUS | Status: DC

## 2022-06-24 MED ORDER — HEPARIN (PORCINE) IN NACL 1000-0.9 UT/500ML-% IV SOLN
INTRAVENOUS | Status: AC
  Filled 2022-06-24: qty 1000

## 2022-06-24 MED ORDER — HYDRALAZINE HCL 20 MG/ML IJ SOLN: 5.0000 mg | INTRAMUSCULAR | Status: DC | PRN

## 2022-06-24 MED ORDER — LIDOCAINE HCL (PF) 1 % IJ SOLN
INTRAMUSCULAR | Status: DC | PRN
  Administered 2022-06-24: 15 mL

## 2022-06-24 MED ORDER — IODIXANOL 320 MG/ML IV SOLN
INTRAVENOUS | Status: DC | PRN
  Administered 2022-06-24: 97 mL

## 2022-06-24 MED ORDER — OXYCODONE HCL 5 MG PO TABS: 5.0000 mg | ORAL_TABLET | ORAL | Status: DC | PRN

## 2022-06-24 MED ORDER — MIDAZOLAM HCL 2 MG/2ML IJ SOLN
INTRAMUSCULAR | Status: AC
  Filled 2022-06-24: qty 2

## 2022-06-24 MED ORDER — ONDANSETRON HCL 4 MG/2ML IJ SOLN: 4.0000 mg | Freq: Four times a day (QID) | INTRAMUSCULAR | Status: DC | PRN

## 2022-06-24 MED ORDER — SODIUM CHLORIDE 0.9 % IV SOLN: 250.0000 mL | INTRAVENOUS | Status: DC | PRN

## 2022-06-24 SURGICAL SUPPLY — 9 items
CATH OMNI FLUSH 5F 65CM (CATHETERS) ×1 IMPLANT
KIT MICROPUNCTURE NIT STIFF (SHEATH) ×1 IMPLANT
KIT PV (KITS) ×2 IMPLANT
SHEATH PINNACLE 5F 10CM (SHEATH) ×1 IMPLANT
SHEATH PROBE COVER 6X72 (BAG) ×1 IMPLANT
SYR MEDRAD MARK V 150ML (SYRINGE) ×1 IMPLANT
TRANSDUCER W/STOPCOCK (MISCELLANEOUS) ×2 IMPLANT
TRAY PV CATH (CUSTOM PROCEDURE TRAY) ×2 IMPLANT
WIRE BENTSON .035X145CM (WIRE) ×1 IMPLANT

## 2022-06-24 NOTE — Interval H&P Note (Signed)
History and Physical Interval Note:  06/24/2022 10:50 AM  [name]  has presented today for surgery, with the diagnosis of atherosclerosis of native arteries of right lower extremity with rest pain.  The various methods of treatment have been discussed with the patient and family. After consideration of risks, benefits and other options for treatment, the patient has consented to  Procedure(s): ABDOMINAL AORTOGRAM W/LOWER EXTREMITY (N/A) as a surgical intervention.  The patient's history has been reviewed, patient examined, no change in status, stable for surgery.  I have reviewed the patient's chart and labs.  Questions were answered to the patient's satisfaction.     Annamarie Major

## 2022-06-24 NOTE — Op Note (Signed)
    Patient name: [name] MRN: [id number] DOB: 07-Aug-1946 Sex: female  06/24/2022 Pre-operative Diagnosis: Right leg claudication Post-operative diagnosis:  Same Surgeon:  Annamarie Major Procedure Performed:  1.  Ultrasound-guided access, left femoral artery  2.  Abdominal aortogram  3.  Bilateral lower extremity runoff  4.  Conscious sedation, 18 minutes    Indications: This is a 76 year old female with history of aortobifemoral bypass graft who is having right leg symptoms.  She comes in today for arteriogram possible intervention.  Procedure:  The patient was identified in the holding area and taken to room 8.  The patient was then placed supine on the table and prepped and draped in the usual sterile fashion.  A time out was called.  Conscious sedation was administered with the use of IV fentanyl and Versed under continuous physician and nurse monitoring.  Heart rate, blood pressure, and oxygen saturation were continuously monitored.  Total sedation time was 18 minutes.  Ultrasound was used to evaluate the left common femoral artery.  It was patent .  A digital ultrasound image was acquired.  A micropuncture needle was used to access the left common femoral artery under ultrasound guidance.  An 018 wire was advanced without resistance and a micropuncture sheath was placed.  The 018 wire was removed and a benson wire was placed.  The micropuncture sheath was exchanged for a 5 french sheath.  An omniflush catheter was advanced over the wire to the level of L-1.  An abdominal angiogram was obtained.  Next, the cath was pulled down to the ER bifurcation bilateral was performed Findings:   Aortogram: No significant renal artery stenosis was identified.  A aortic graft is identified just below the renal arteries.  The proximal anastomosis is widely patent.  The graft is widely patent.  Right Lower Extremity: The right femoral anastomosis is widely patent without stenosis.  The profundofemoral  artery is widely patent.  There is a flush occlusion of the superficial femoral artery with reconstitution of the above-knee popliteal artery and two-vessel runoff via the anterior tibial and posterior tibial artery.  Left Lower Extremity: The left common femoral artery is widely patent as is the proximal anastomosis.  The profundofemoral and superficial femoral artery are widely patent.  The popliteal artery is widely patent with two-vessel runoff via the anterior tibial and peroneal artery.  Intervention: None  Impression:  #1  Widely patent aortobifemoral bypass graft  #2  No significant stenosis identified on the left leg  #3  There is a flush occlusion of the right superficial femoral artery with reconstitution of the above-knee popliteal artery.  The patient will be considered for right femoral to above-knee popliteal artery bypass graft    V. Annamarie Major, M.D., [facility] [place] Office: [phone] Pager:  [phone]

## 2022-06-25 ENCOUNTER — Encounter (HOSPITAL_COMMUNITY): Payer: Self-pay | Admitting: Surgery

## 2022-06-27 ENCOUNTER — Other Ambulatory Visit (HOSPITAL_COMMUNITY): Payer: Self-pay | Admitting: Surgery

## 2022-06-27 ENCOUNTER — Ambulatory Visit (HOSPITAL_COMMUNITY)
Admission: RE | Admit: 2022-06-27 | Discharge: 2022-06-27 | Disposition: A | Discharge status: Home / Self Care | Payer: Medicare HMO | Source: Ambulatory Visit | Attending: Vascular Surgery | Admitting: Vascular Surgery

## 2022-06-27 DIAGNOSIS — I739 Peripheral vascular disease, unspecified: Secondary | ICD-10-CM

## 2022-06-30 ENCOUNTER — Encounter: Payer: Self-pay | Admitting: Surgery

## 2022-06-30 ENCOUNTER — Ambulatory Visit: Payer: Medicare HMO | Admitting: Surgery

## 2022-06-30 VITALS — BP 108/74 | HR 84 | Temp 97.9°F | Resp 20 | Ht 62.0 in | Wt 116.0 lb

## 2022-06-30 DIAGNOSIS — I70213 Atherosclerosis of native arteries of extremities with intermittent claudication, bilateral legs: Secondary | ICD-10-CM

## 2022-06-30 MED ORDER — GABAPENTIN 300 MG PO CAPS: 300.0000 mg | ORAL_CAPSULE | Freq: Every day | ORAL | 0 refills | Status: DC

## 2022-06-30 NOTE — Progress Notes (Signed)
[facility]  Patient name: ANNASTON UPHAM MRN: [id number] DOB: 12-26-1945 Sex: female   REASON FOR VISIT:    Follow up  HISOTRY OF PRESENT ILLNESS:    LORRA FREEMAN is a 76 y.o. female who is status post aortobifemoral bypass graft on 05/16/2011.  This was done for claudication.  She underwent angiography in 2021 and was found to have 70% stenosis of her left femoral anastomosis with short segment occlusion of her proximal left superficial femoral artery.  She also had an occluded right superficial femoral artery.  On 01/27/2020, she underwent left femoral endarterectomy with dacryon patch angioplasty.  She was seen in July 2023 for severe right leg pain which is alleviated by hanging her leg over the bed.  She continues to be without wounds.  She underwent angiography on 06/24/2022 and was found to have a flush occlusion of the right superficial femoral artery.  She is back today to gust surgical options  On further examination of her symptoms, she reports bilateral foot pain and numbness.  She will wake up in the middle the night with cramps.  She does have issues with ambulation particular on the right, however her biggest complaint is more of the burning and numbness.  The patient is undergoing treatment for bladder cancer in the past.  She is medically managed for hypertension.  She takes a statin for hypercholesterolemia.   PAST MEDICAL HISTORY:   Past Medical History:  Diagnosis Date   Abdominal wall hernia    Arthritis    Bladder cancer ([place])    Blood transfusion    30+ yrs. ago   Cancer ([place])    Right Breast   Hyperlipidemia    Hypertension    Peripheral vascular disease (HCC)    Seasonal allergies    TIA (transient ischemic attack)      FAMILY HISTORY:   Family History  Problem Relation Age of [place]   Heart attack Sister    Heart attack Brother    Stroke Sister    Cancer Mother        BREAST,  BRAIN   Heart disease Mother    Cancer Paternal Aunt        breast ca   Cancer Paternal Grandmother        unknown ca    SOCIAL HISTORY:   Social History   Tobacco Use   Smoking status: Some Days    Packs/day: 0.50    Years: 30.00    Total pack years: 15.00    Types: Cigarettes    Passive exposure: Never   Smokeless tobacco: Never   Tobacco comments:    pt states that she uses the smokeless cig and is trying to quit  Substance Use Topics   Alcohol use: No    Comment: occasional alcohol intake     ALLERGIES:   Allergies  Allergen Reactions   Latex Rash     CURRENT MEDICATIONS:   Current Outpatient Medications  Medication Sig Dispense Refill   acetaminophen (TYLENOL) 500 MG tablet Take 1,000 mg by mouth every 6 (six) hours as needed for mild pain or moderate pain.     aspirin EC 81 MG tablet Take 81 mg by mouth in the morning.     atorvastatin (LIPITOR) 20 MG tablet TAKE 1 TABLET(20 MG) BY MOUTH DAILY (Patient taking differently: Take 20 mg by mouth daily at 6 PM. (1900)) 90 tablet 1   Camphor-Eucalyptus-Menthol (VICKS VAPORUB) 4.73-1.2-2.6 % OINT Apply  1 application topically 3 (three) times daily as needed (leg discomfort).     Cholecalciferol (VITAMIN D) 50 MCG (2000 UT) tablet Take 2,000 Units by mouth every evening.      clopidogrel (PLAVIX) 75 MG tablet TAKE 1 TABLET BY MOUTH EVERY DAY. RESUME 1 TIME YOU NO LONGER HAVE ANY BLEEDING IN YOUR URINE OR STOOL AND HAVE COMPLETED ALL SCHEDULED PROCEDURES (Patient taking differently: Take 75 mg by mouth daily at 6 PM. (1900)) 90 tablet 1   docusate sodium (COLACE) 100 MG capsule Take 200 mg by mouth 2 (two) times daily as needed for mild constipation.     Emollient (CETAPHIL) cream Apply 1 Application topically as needed (dry/irritated skin.).     hydrochlorothiazide (HYDRODIURIL) 25 MG tablet TAKE 1 TABLET(25 MG) BY MOUTH DAILY (Patient taking differently: Take 25 mg by mouth daily at 6 PM. (1900)) 90 tablet 1    hydroxypropyl methylcellulose / hypromellose (ISOPTO TEARS / GONIOVISC) 2.5 % ophthalmic solution Place 1 drop into both eyes 3 (three) times daily as needed (dry/irritated eyes.).     ibuprofen (ADVIL) 200 MG tablet Take 200-400 mg by mouth every 8 (eight) hours as needed (pain.).     Iron-Vitamins (GERITOL PO) Take 20 mLs by mouth daily.     lactose free nutrition (BOOST) LIQD Take 237 mLs by mouth 3 (three) times a week.     triamcinolone cream (KENALOG) 0.1 % Apply 1 application topically 2 (two) times daily. (Patient taking differently: Apply 1 application  topically 2 (two) times daily as needed (skin irritation.).) 30 g 0   No current facility-administered medications for this visit.    REVIEW OF SYSTEMS:   '[X]'$  denotes positive finding, '[ ]'$  denotes negative finding Cardiac  Comments:  Chest pain or chest pressure:    Shortness of breath upon exertion:    Short of breath when lying flat:    Irregular heart rhythm:        Vascular    Pain in calf, thigh, or hip brought on by ambulation: x   Pain in feet at night that wakes you up from your sleep:  x   Blood clot in your veins:    Leg swelling:         Pulmonary    Oxygen at home:    Productive cough:     Wheezing:         Neurologic    Sudden weakness in arms or legs:     Sudden numbness in arms or legs:     Sudden onset of difficulty speaking or slurred speech:    Temporary loss of vision in one eye:     Problems with dizziness:         Gastrointestinal    Blood in stool:     Vomited blood:         Genitourinary    Burning when urinating:     Blood in urine:        Psychiatric    Major depression:         Hematologic    Bleeding problems:    Problems with blood clotting too easily:        Skin    Rashes or ulcers:        Constitutional    Fever or chills:      PHYSICAL EXAM:   Vitals:   06/30/22 1427  BP: 108/74  Pulse: 84  Resp: 20  Temp: 97.9 F (36.6 C)  SpO2: 97%  Weight: 116 lb (52.6 kg)   Height: '5\' 2"'$  (1.575 m)    GENERAL: The patient is a well-nourished female, in no acute distress. The vital signs are documented above. CARDIAC: There is a regular rate and rhythm.  VASCULAR: Palpable left dorsalis pedis pulse, nonpalpable right PULMONARY: Non-labored respirations MUSCULOSKELETAL: There are no major deformities or cyanosis. NEUROLOGIC: No focal weakness or paresthesias are detected. SKIN: There are no ulcers or rashes noted. PSYCHIATRIC: The patient has a normal affect.  STUDIES:   I have reviewed the following studies: ----+  RT Diameter   RT Findings             GSV          LT Diameter LT  Findings      (cm)                                               (cm)                  +------------+----------------+-----------------------+------------+-------  ----+      0.52                    Saphenofemoral Junction    0.71                  +------------+----------------+-----------------------+------------+-------  ----+  0.41 / 0.27    branching        Proximal thigh     0.51 / 0.37   branching   +------------+----------------+-----------------------+------------+-------  ----+  0.35 / 0.19    branching           Mid thigh           0.35                  +------------+----------------+-----------------------+------------+-------  ----+      0.15    branching and NV     Distal thigh      0.27 / 0.25   branching   +------------+----------------+-----------------------+------------+-------  ----+                     NV                Knee          0.28 / 0.18 out  fascia   +------------+----------------+-----------------------+------------+-------  ----+                     NV              Prox calf       0.30 / 0.16   branching   +------------+----------------+-----------------------+------------+-------  ----+      0.23       branching           Mid calf            0.22                   +------------+----------------+-----------------------+------------+-------  ----+      0.26       branching          Distal calf          0.20                  +------------+----------------+-----------------------+------------+-------  ----+   +----------------+-----------+---------------+----------------+-----------+   RT diameter (cm)RT Findings  SSV      LT Diameter (cm)LT  Findings  +----------------+-----------+---------------+----------------+-----------+         0.15                 Popliteal fossa      0.17                    +----------------+-----------+---------------+----------------+-----------+     0.16 / 0.20    branching  Proximal calf       0.13                    +----------------+-----------+---------------+----------------+-----------+         0.22                    Mid calf      0.22 / 0.17    branching    +----------------+-----------+---------------+----------------+-----------+         0.16                   Distal calf        0.17                    +----------------+-----------+---------------+----------------+-----------+    MEDICAL ISSUES:   PAD: I discussed with the patient and her husband that in light of having symptoms in both legs, I am suspicious that her right superficial femoral artery occlusion is not the only reason why she is having leg pain.  Her ABIs have been stable at 0.65 for the past 3 years.  Before putting her through a right femoral-popliteal bypass graft, which will most likely need to be with PTFE given her vein mapping, I am going to give her a trial of Neurontin to see if this helps alleviate some of her symptoms.  If it does not, we will need to consider surgical revascularization which would be a right femoral to above-knee popliteal artery bypass graft.  She will follow-up with me in 1 month to discuss the results of her trial of gabapentin.    Leia Alf, MD, FACS [facility] [phone] Pager [phone]

## 2022-07-07 ENCOUNTER — Other Ambulatory Visit: Payer: Self-pay

## 2022-07-07 NOTE — Patient Outreach (Signed)
[facility]) Care Management  07/07/2022  DEVIN FOSKEY 1946-10-24 [id number]   Telephone Assessment   Outreach attempt to patient. Spoke with spouse. He voices patient doing fairly well. She Is currently sleep at present Spouse wrote down RN CM number and will have patient return call.       Enzo Montgomery, RN,BSN,CCM [place] Management Telephonic Care Management Coordinator Direct Phone: [phone] Toll Free: [phone] Fax: [phone]

## 2022-07-07 NOTE — Patient Outreach (Signed)
[facility]) Care Management  07/07/2022  [name] January 06, 1946 [id number]   Telephone Assessment    Voicemail message received from patient returning RN CM call. Patient states she is doing fairly well. She goes back in Nov for bladder scan and resumption of txs. MD has told her she is having some blockage in leg due to circulation issues from smoking-taking meds.She is trying to quit smoking and down to 5 cigarettes-praised and encouraged pt to continue this. Denies any RN CM concerns or issues at this time.     Care Plan : RN Care Manager POC  Updates made by Hayden Pedro, RN since 07/07/2022 12:00 AM     Problem: Chronic Disease Mgmt of Chronic Condition-HTN, bladder CA   Priority: High     Long-Range Goal: Development of POC for Mgmt of Chronic Condition-HTN   Start Date: 12/02/2021  Expected End Date: 12/02/2022  This Visit's Progress: On track  Recent Progress: On track  Priority: High  Note:   Current Barriers:  Chronic Disease Management support and education needs related to HTN   RNCM Clinical Goal(s):  Patient will verbalize understanding of plan for management of HTN as evidenced by mgmt of chronic conditions demonstrate Ongoing health management independence as evidenced by BP within normal parameters continue to work with RN Care Manager to address care management and care coordination needs related to  HTN as evidenced by adherence to CM Team Scheduled appointments through collaboration with RN Care manager, provider, and care team.   Interventions: POC sent to PCP upon initial assessment, quarterly and with any changes in patient's conditions Inter-disciplinary care team collaboration (see longitudinal plan of care) Evaluation of current treatment plan related to  self management and patient's adherence to plan as established by provider   Hypertension Interventions:  (Status:  Goal on track:  Yes.) Long Term Goal Last  practice recorded BP readings:  BP Readings from Last 3 Encounters:  10/30/21 131/66  10/22/21 131/77  07/12/21 116/72  Most recent eGFR/CrCl:  Lab Results  Component Value Date   EGFR 77 03/07/2021    No components found for: CRCL  Evaluation of current treatment plan related to hypertension self management and patient's adherence to plan as established by provider Discussed plans with patient for ongoing care management follow up and provided patient with direct contact information for care management team Advised patient, providing education and rationale, to monitor blood pressure daily and record, calling PCP for findings outside established parameters Provided education on prescribed diet low salt  03/03/22-Patient monitoring BP in home weekly-encouraged to do more often. Reports BP readings WNL range.  07/07/22-Pt reports condition stable.  Patient Goals/Self-Care Activities: Take all medications as prescribed Attend all scheduled provider appointments Call provider office for new concerns or questions  check blood pressure 3 times per week write blood pressure results in a log or diary call doctor for signs and symptoms of high blood pressure  Follow Up Plan:  Telephone follow up appointment with care management team member scheduled for:  within the month of Nov The patient has been provided with contact information for the care management team and has been advised to call with any health related questions or concerns.      Long-Range Goal: Development of POC for Mgmt of Chronic Condition-Bladder CA   Start Date: 12/02/2021  Expected End Date: 12/02/2022  This Visit's Progress: On track  Recent Progress: On track  Priority: High  Note:    Current  Barriers:  Chronic Disease Management support and education needs related to bladder CA   RNCM Clinical Goal(s):  Patient will verbalize understanding of plan for management of cancer as evidenced by adherence to tx  plan continue to work with RN Care Manager to address care management and care coordination needs related to  cancer as evidenced by adherence to CM Team Scheduled appointments through collaboration with RN Care manager, provider, and care team.   Interventions: POC sent to PCP upon initial assessment, quarterly and with any changes in patient's conditions Inter-disciplinary care team collaboration (see longitudinal plan of care) Evaluation of current treatment plan related to  self management and patient's adherence to plan as established by provider   Oncology:  (Status: Goal on track:  Yes.) Long Term Goal Assessment of understanding of oncology diagnosis:  Assessed patient understanding of cancer diagnosis and recommended treatment plan, Reviewed upcoming provider appointments and treatment appointments, and Assessed support system. Has consistent/reliable family or other support: Yes  03/03/22-Patient  has started first round of txs-states she tolerated them well.  07/07/22-Pt goes back for scans in Nov and then resumption of txs Patient Goals/Self-Care Activities: Take all medications as prescribed Attend all scheduled provider appointments Perform all self care activities independently  Call provider office for new concerns or questions   Follow Up Plan:  Telephone follow up appointment with care management team member scheduled for:  within the month of Nov The patient has been provided with contact information for the care management team and has been advised to call with any health related questions or concerns.        Plan: RN CM discussed with patient next outreach within the month of Nov. Patient agrees to care plan and follow up.  Enzo Montgomery, RN,BSN,CCM [place] Management Telephonic Care Management Coordinator Direct Phone: [phone] Toll Free: [phone] Fax: [phone]

## 2022-07-15 ENCOUNTER — Telehealth: Payer: Self-pay

## 2022-07-15 NOTE — Telephone Encounter (Signed)
Pt called stating that when she was taking her medication at night only, she was tolerating it well. Once she started taking it in the morning also, she started having lots of symptoms.  Reviewed pt's chart, returned call for clarification, two identifiers used. Pt stated that the medication is Gabapentin and taking it nightly has relieved her leg pain and helped her sleep. When she took an additional dosage in the morning, as she thought Dr Trula Slade had directed her to after the first 7 days of starting it, she was dizzy, bumping into things around the house, and nauseated. She stopped taking it in the morning. Instructed pt to continue taking at [facility] as she has been since that is alleviating her symptoms. She can discuss it with him when she sees him at her next visit on 9/18. Confirmed understanding.

## 2022-07-16 ENCOUNTER — Other Ambulatory Visit: Payer: Self-pay

## 2022-07-16 NOTE — Patient Outreach (Signed)
[facility]) Care Management  07/16/2022  ASHTAN LATON [birth year] [id number]   Case Closure     Case is being transferred to [place] services. Assigned RN CM will outreach and follow up with patient.   Enzo Montgomery, RN,BSN,CCM [place] [facility] Telephonic Care Management Coordinator Direct Phone: [phone] Toll Free: [phone] Fax: [phone]

## 2022-07-23 ENCOUNTER — Encounter: Payer: Self-pay | Admitting: Internal Medicine

## 2022-08-04 ENCOUNTER — Ambulatory Visit: Payer: Medicare HMO | Admitting: Surgery

## 2022-08-04 ENCOUNTER — Encounter: Payer: Self-pay | Admitting: Surgery

## 2022-08-04 VITALS — BP 109/73 | HR 74 | Temp 97.9°F | Resp 20 | Ht 62.0 in | Wt 117.0 lb

## 2022-08-04 DIAGNOSIS — I70213 Atherosclerosis of native arteries of extremities with intermittent claudication, bilateral legs: Secondary | ICD-10-CM

## 2022-08-04 MED ORDER — GABAPENTIN 300 MG PO CAPS: 300.0000 mg | ORAL_CAPSULE | Freq: Every day | ORAL | 12 refills | Status: AC

## 2022-08-04 NOTE — Progress Notes (Signed)
[facility] [place]  Patient name: [name] MRN: [id number] DOB: 03-30-1946 Sex: female   REASON FOR VISIT:    Follow up  HISOTRY OF PRESENT ILLNESS:    [name] is a 76 y.o. female who is status post aortobifemoral bypass graft on 05/16/2011.  This was done for claudication.  She underwent angiography in 2021 and was found to have 70% stenosis of her left femoral anastomosis with short segment occlusion of her proximal left superficial femoral artery.  She also had an occluded right superficial femoral artery.  On 01/27/2020, she underwent left femoral endarterectomy with dacryon patch angioplasty.  She was seen in July 2023 for severe right leg pain which is alleviated by hanging her leg over the bed.  She continues to be without wounds.  She underwent angiography on 06/24/2022 and was found to have a flush occlusion of the right superficial femoral artery.     On further examination of her symptoms, she reports bilateral foot pain and numbness.  She will wake up in the middle the night with cramps.  She does have issues with ambulation particular on the right, however her biggest complaint is more of the burning and numbness.  Her ABIs have been stable at 0.65 for the past 3 years.  At her visit 1 month ago I elected to start her on Neurontin to see if this helps alleviate her symptoms before proceeding with a right femoral to above-knee popliteal bypass which would need to be done with PTFE.  She states that the Neurontin dramatically improved her symptoms.  She is now able to sleep through the night.   The patient is undergoing treatment for bladder cancer in the past.  She is medically managed for hypertension.  She takes a statin for hypercholesterolemia.   PAST MEDICAL HISTORY:   Past Medical History:  Diagnosis Date   Abdominal wall hernia    Arthritis    Bladder cancer ([place])    Blood transfusion    30+ yrs.  ago   Cancer ([place])    Right Breast   Hyperlipidemia    Hypertension    Peripheral vascular disease (HCC)    Seasonal allergies    TIA (transient ischemic attack)      FAMILY HISTORY:   Family History  Problem Relation Age of Onset   Heart attack Sister    Heart attack Brother    Stroke Sister    Cancer Mother        BREAST, BRAIN   Heart disease Mother    Cancer Paternal Aunt        breast ca   Cancer Paternal Grandmother        unknown ca    SOCIAL HISTORY:   Social History   Tobacco Use   Smoking status: Some Days    Packs/day: 0.50    Years: 30.00    Total pack years: 15.00    Types: Cigarettes    Passive exposure: Never   Smokeless tobacco: Never   Tobacco comments:    pt states that she uses the smokeless cig and is trying to quit  Substance Use Topics   Alcohol use: No    Comment: occasional alcohol intake     ALLERGIES:   Allergies  Allergen Reactions   Latex Rash     CURRENT MEDICATIONS:   Current Outpatient Medications  Medication Sig Dispense Refill   acetaminophen (TYLENOL) 500 MG tablet Take 1,000 mg by mouth every 6 (  six) hours as needed for mild pain or moderate pain.     aspirin EC 81 MG tablet Take 81 mg by mouth in the morning.     atorvastatin (LIPITOR) 20 MG tablet TAKE 1 TABLET(20 MG) BY MOUTH DAILY (Patient taking differently: Take 20 mg by mouth daily at 6 PM. (1900)) 90 tablet 1   Camphor-Eucalyptus-Menthol (VICKS VAPORUB) 4.73-1.2-2.6 % OINT Apply 1 application topically 3 (three) times daily as needed (leg discomfort).     Cholecalciferol (VITAMIN D) 50 MCG (2000 UT) tablet Take 2,000 Units by mouth every evening.      clopidogrel (PLAVIX) 75 MG tablet TAKE 1 TABLET BY MOUTH EVERY DAY. RESUME 1 TIME YOU NO LONGER HAVE ANY BLEEDING IN YOUR URINE OR STOOL AND HAVE COMPLETED ALL SCHEDULED PROCEDURES (Patient taking differently: Take 75 mg by mouth daily at 6 PM. (1900)) 90 tablet 1   docusate sodium (COLACE) 100 MG capsule Take  200 mg by mouth 2 (two) times daily as needed for mild constipation.     Emollient (CETAPHIL) cream Apply 1 Application topically as needed (dry/irritated skin.).     gabapentin (NEURONTIN) 300 MG capsule Take 1 capsule (300 mg total) by mouth daily. (Patient taking differently: Take 300 mg by mouth at bedtime.) 60 capsule 0   hydrochlorothiazide (HYDRODIURIL) 25 MG tablet TAKE 1 TABLET(25 MG) BY MOUTH DAILY (Patient taking differently: Take 25 mg by mouth daily at 6 PM. (1900)) 90 tablet 1   hydroxypropyl methylcellulose / hypromellose (ISOPTO TEARS / GONIOVISC) 2.5 % ophthalmic solution Place 1 drop into both eyes 3 (three) times daily as needed (dry/irritated eyes.).     ibuprofen (ADVIL) 200 MG tablet Take 200-400 mg by mouth every 8 (eight) hours as needed (pain.).     Iron-Vitamins (GERITOL PO) Take 20 mLs by mouth daily.     lactose free nutrition (BOOST) LIQD Take 237 mLs by mouth 3 (three) times a week.     triamcinolone cream (KENALOG) 0.1 % Apply 1 application topically 2 (two) times daily. (Patient taking differently: Apply 1 application  topically 2 (two) times daily as needed (skin irritation.).) 30 g 0   No current facility-administered medications for this visit.    REVIEW OF SYSTEMS:   '[X]'$  denotes positive finding, '[ ]'$  denotes negative finding Cardiac  Comments:  Chest pain or chest pressure:    Shortness of breath upon exertion:    Short of breath when lying flat:    Irregular heart rhythm:        Vascular    Pain in calf, thigh, or hip brought on by ambulation:    Pain in feet at night that wakes you up from your sleep:     Blood clot in your veins:    Leg swelling:         Pulmonary    Oxygen at home:    Productive cough:     Wheezing:         Neurologic    Sudden weakness in arms or legs:     Sudden numbness in arms or legs:     Sudden onset of difficulty speaking or slurred speech:    Temporary loss of vision in one eye:     Problems with dizziness:          Gastrointestinal    Blood in stool:     Vomited blood:         Genitourinary    Burning when urinating:     Blood in  urine:        Psychiatric    Major depression:         Hematologic    Bleeding problems:    Problems with blood clotting too easily:        Skin    Rashes or ulcers:        Constitutional    Fever or chills:      PHYSICAL EXAM:   Vitals:   08/04/22 0903  BP: 109/73  Pulse: 74  Resp: 20  Temp: 97.9 F (36.6 C)  SpO2: 98%  Weight: 117 lb (53.1 kg)  Height: '5\' 2"'$  (1.575 m)    GENERAL: The patient is a well-nourished female, in no acute distress. The vital signs are documented above. CARDIAC: There is a regular rate and rhythm.  PULMONARY: Non-labored respirations MUSCULOSKELETAL: There are no major deformities or cyanosis. NEUROLOGIC: No focal weakness or paresthesias are detected. SKIN: There are no ulcers or rashes noted. PSYCHIATRIC: The patient has a normal affect.  STUDIES:   none  MEDICAL ISSUES:   The patient has had good relief from her Neurontin.  I do not think her symptoms are related to claudication.  I have her scheduled for follow-up in 1 year.  She will contact me if she develops a change in her symptoms, or if she develops a nonhealing wound.    Leia Alf, MD, FACS [facility] [phone] Pager [phone]

## 2022-08-07 ENCOUNTER — Other Ambulatory Visit: Payer: Self-pay

## 2022-08-07 DIAGNOSIS — I739 Peripheral vascular disease, unspecified: Secondary | ICD-10-CM

## 2022-08-12 ENCOUNTER — Telehealth: Payer: Self-pay | Admitting: Licensed Clinical Social Worker

## 2022-08-12 NOTE — Patient Outreach (Signed)
  Care Coordination   08/12/2022 Name: [name] MRN: [id number] DOB: Mar 23, 1946   Care Coordination Outreach Attempts:  An unsuccessful telephone outreach was attempted today to offer the patient information about available care coordination services as a benefit of their health plan.   Follow Up Plan:  Additional outreach attempts will be made to offer the patient care coordination information and services.   Encounter Outcome:  No Answer  Care Coordination Interventions Activated:  No   Care Coordination Interventions:  No, not indicated    Christa See, MSW, [email or web address] Phone [phone] 3:21 PM

## 2022-08-26 ENCOUNTER — Encounter: Payer: Self-pay | Admitting: Internal Medicine

## 2022-09-08 ENCOUNTER — Encounter: Payer: Self-pay | Admitting: Internal Medicine

## 2022-09-13 ENCOUNTER — Other Ambulatory Visit: Payer: Self-pay | Admitting: Family Medicine

## 2022-09-13 DIAGNOSIS — I739 Peripheral vascular disease, unspecified: Secondary | ICD-10-CM

## 2022-09-13 DIAGNOSIS — I10 Essential (primary) hypertension: Secondary | ICD-10-CM

## 2022-09-13 DIAGNOSIS — I70213 Atherosclerosis of native arteries of extremities with intermittent claudication, bilateral legs: Secondary | ICD-10-CM

## 2022-09-23 ENCOUNTER — Other Ambulatory Visit: Payer: Medicare HMO

## 2022-09-29 DIAGNOSIS — C678 Malignant neoplasm of overlapping sites of bladder: Secondary | ICD-10-CM | POA: Diagnosis not present

## 2022-10-15 DIAGNOSIS — Z5111 Encounter for antineoplastic chemotherapy: Secondary | ICD-10-CM | POA: Diagnosis not present

## 2022-10-15 DIAGNOSIS — C678 Malignant neoplasm of overlapping sites of bladder: Secondary | ICD-10-CM | POA: Diagnosis not present

## 2022-10-23 DIAGNOSIS — C678 Malignant neoplasm of overlapping sites of bladder: Secondary | ICD-10-CM | POA: Diagnosis not present

## 2022-10-23 DIAGNOSIS — Z5111 Encounter for antineoplastic chemotherapy: Secondary | ICD-10-CM | POA: Diagnosis not present

## 2022-10-29 DIAGNOSIS — C678 Malignant neoplasm of overlapping sites of bladder: Secondary | ICD-10-CM | POA: Diagnosis not present

## 2022-10-29 DIAGNOSIS — Z5111 Encounter for antineoplastic chemotherapy: Secondary | ICD-10-CM | POA: Diagnosis not present

## 2022-11-25 ENCOUNTER — Ambulatory Visit (INDEPENDENT_AMBULATORY_CARE_PROVIDER_SITE_OTHER): Payer: Medicare HMO | Admitting: Family Medicine

## 2022-11-25 ENCOUNTER — Encounter: Payer: Self-pay | Admitting: Family Medicine

## 2022-11-25 VITALS — BP 134/70 | HR 83 | Resp 14 | Wt 115.4 lb

## 2022-11-25 DIAGNOSIS — Z13820 Encounter for screening for osteoporosis: Secondary | ICD-10-CM | POA: Diagnosis not present

## 2022-11-25 DIAGNOSIS — G8929 Other chronic pain: Secondary | ICD-10-CM

## 2022-11-25 DIAGNOSIS — M79673 Pain in unspecified foot: Secondary | ICD-10-CM | POA: Diagnosis not present

## 2022-11-25 DIAGNOSIS — H9193 Unspecified hearing loss, bilateral: Secondary | ICD-10-CM | POA: Diagnosis not present

## 2022-11-25 DIAGNOSIS — L309 Dermatitis, unspecified: Secondary | ICD-10-CM

## 2022-11-25 NOTE — Patient Instructions (Signed)
Use cortisone cream sparingly twice per day for the next 3 weeks and if there is any problems we can always get you in with a dermatologist

## 2022-11-25 NOTE — Progress Notes (Signed)
   Subjective:    Patient ID: [name], female    DOB: 26-Oct-1946, 77 y.o.   MRN: [id number]  HPI She is here for evaluation of possible cerumen impaction.  She also has a rash present in both axilla that she would like me to look at.  She then proceeded to say that she has not had a DEXA scan and is scheduled for repeat of this in April.  She is wondering if we need to switch her to a different account.  She also complains of difficulty with her feet specifically getting her nails trimmed properly and also thickening of the heel.   Review of Systems     Objective:   Physical Exam Alert and in no distress.  Both tympanic membranes and canals are normal.  Hearing test did show high-frequency bilateral hearing loss.  Exam of her feet does show the nails to be in good shape with no evidence of onychomycosis however she does have thickening of the heels. Hearing test did show evidence of high-frequency hearing loss. Exam of both axilla does show hyperpigmentation in the anterior axilla that is consistent on both sides.     Assessment & Plan:  Dermatitis  Chronic heel pain, unspecified laterality  Screening for osteoporosis  High frequency hearing loss of both ears - Plan: Hearing screening Recommend cortisone cream for the dermatitis explained that I did not think she was any danger.  I will refer to podiatry to help with the foot problem that she is having. Then discussed that hearing loss that she is having recommended referring to audiology but she wants to hold off on that.

## 2022-12-05 ENCOUNTER — Ambulatory Visit (INDEPENDENT_AMBULATORY_CARE_PROVIDER_SITE_OTHER): Payer: Medicare HMO

## 2022-12-05 ENCOUNTER — Ambulatory Visit: Payer: Medicare HMO | Admitting: Podiatry

## 2022-12-05 VITALS — BP 109/69 | HR 78

## 2022-12-05 DIAGNOSIS — M79672 Pain in left foot: Secondary | ICD-10-CM | POA: Diagnosis not present

## 2022-12-05 DIAGNOSIS — M79674 Pain in right toe(s): Secondary | ICD-10-CM | POA: Diagnosis not present

## 2022-12-05 DIAGNOSIS — B351 Tinea unguium: Secondary | ICD-10-CM

## 2022-12-05 DIAGNOSIS — M79671 Pain in right foot: Secondary | ICD-10-CM

## 2022-12-05 DIAGNOSIS — M79675 Pain in left toe(s): Secondary | ICD-10-CM

## 2022-12-05 NOTE — Progress Notes (Signed)
   Chief Complaint  Patient presents with   Nail Problem    Nails are sore, patient is seeing Vein and Vas, patient had PAD, right foot heel callus, turned dark,     SUBJECTIVE Patient presents to office today complaining of elongated, thickened nails that cause pain while ambulating in shoes.  Patient is unable to trim their own nails.  Patient also states that she has a symptomatic callus to the right posterior heel.  Patient is here for further evaluation and treatment.  Past Medical History:  Diagnosis Date   Abdominal wall hernia    Arthritis    Bladder cancer ([place])    Blood transfusion    30+ yrs. ago   Cancer ([place])    Right Breast   Hyperlipidemia    Hypertension    Peripheral vascular disease ([place])    Seasonal allergies    TIA (transient ischemic attack)     Allergies  Allergen Reactions   Latex Rash     OBJECTIVE General Patient is awake, alert, and oriented x 3 and in no acute distress. Derm Skin is dry and supple bilateral. Negative open lesions or macerations. Remaining integument unremarkable. Nails are tender, long, thickened and dystrophic with subungual debris, consistent with onychomycosis, 1-5 bilateral. No signs of infection noted.  There is a symptomatic hyperkeratotic callus to the right posterior heel.  No open wound. Vasc known history of peripheral vascular disease.  No acute ischemic symptoms at the moment.  Well-known to vein and vascular specialist in [place] and protective threshold sensation grossly intact bilaterally.  Musculoskeletal Exam No symptomatic pedal deformities noted bilateral. Muscular strength within normal limits.  ASSESSMENT 1.  Pain due to onychomycosis of toenails both 2.  Symptomatic callus right posterior heel PLAN OF CARE 1. Patient evaluated today.  2. Instructed to maintain good pedal hygiene and foot care.  3. Mechanical debridement of nails 1-5 bilaterally performed using a nail nipper. Filed with  dremel without incident.  4.  Excisional debridement of the hyperkeratotic callus was performed to the posterior heel.  Patient felt immediate relief.  No incident or bleeding.  Performed with a 312 scalpel  5.  Return to clinic in 3 mos. for routine footcare   Edrick Kins, DPM [facility]  Dr. Edrick Kins, DPM    [address], [place] [address]                Office [phone]  Fax [phone]

## 2022-12-15 ENCOUNTER — Other Ambulatory Visit: Payer: Self-pay | Admitting: Podiatry

## 2022-12-15 DIAGNOSIS — B351 Tinea unguium: Secondary | ICD-10-CM

## 2022-12-15 DIAGNOSIS — M79671 Pain in right foot: Secondary | ICD-10-CM

## 2023-03-10 ENCOUNTER — Ambulatory Visit
Admission: RE | Admit: 2023-03-10 | Discharge: 2023-03-10 | Disposition: A | Discharge status: Home / Self Care | Payer: Medicare HMO | Source: Ambulatory Visit | Attending: Family Medicine | Admitting: Family Medicine

## 2023-03-10 DIAGNOSIS — Z780 Asymptomatic menopausal state: Secondary | ICD-10-CM | POA: Diagnosis not present

## 2023-03-12 ENCOUNTER — Other Ambulatory Visit: Payer: Self-pay | Admitting: Family Medicine

## 2023-03-12 DIAGNOSIS — I10 Essential (primary) hypertension: Secondary | ICD-10-CM

## 2023-03-12 DIAGNOSIS — I739 Peripheral vascular disease, unspecified: Secondary | ICD-10-CM

## 2023-03-12 DIAGNOSIS — I70213 Atherosclerosis of native arteries of extremities with intermittent claudication, bilateral legs: Secondary | ICD-10-CM

## 2023-03-16 ENCOUNTER — Ambulatory Visit: Payer: Medicare HMO | Admitting: Podiatry

## 2023-03-17 ENCOUNTER — Encounter: Payer: Self-pay | Admitting: Family Medicine

## 2023-03-17 ENCOUNTER — Ambulatory Visit (INDEPENDENT_AMBULATORY_CARE_PROVIDER_SITE_OTHER): Payer: Medicare HMO | Admitting: Family Medicine

## 2023-03-17 VITALS — BP 118/70 | HR 82 | Temp 98.0°F | Resp 16 | Ht 61.5 in | Wt 110.6 lb

## 2023-03-17 DIAGNOSIS — I739 Peripheral vascular disease, unspecified: Secondary | ICD-10-CM | POA: Diagnosis not present

## 2023-03-17 DIAGNOSIS — Z9841 Cataract extraction status, right eye: Secondary | ICD-10-CM

## 2023-03-17 DIAGNOSIS — J301 Allergic rhinitis due to pollen: Secondary | ICD-10-CM | POA: Diagnosis not present

## 2023-03-17 DIAGNOSIS — M8580 Other specified disorders of bone density and structure, unspecified site: Secondary | ICD-10-CM

## 2023-03-17 DIAGNOSIS — Z0000 Encounter for general adult medical examination without abnormal findings: Secondary | ICD-10-CM

## 2023-03-17 DIAGNOSIS — Z9842 Cataract extraction status, left eye: Secondary | ICD-10-CM

## 2023-03-17 DIAGNOSIS — I70213 Atherosclerosis of native arteries of extremities with intermittent claudication, bilateral legs: Secondary | ICD-10-CM | POA: Diagnosis not present

## 2023-03-17 DIAGNOSIS — M65322 Trigger finger, left index finger: Secondary | ICD-10-CM

## 2023-03-17 DIAGNOSIS — I700 Atherosclerosis of aorta: Secondary | ICD-10-CM | POA: Diagnosis not present

## 2023-03-17 DIAGNOSIS — I10 Essential (primary) hypertension: Secondary | ICD-10-CM

## 2023-03-17 DIAGNOSIS — K31819 Angiodysplasia of stomach and duodenum without bleeding: Secondary | ICD-10-CM

## 2023-03-17 DIAGNOSIS — F17210 Nicotine dependence, cigarettes, uncomplicated: Secondary | ICD-10-CM

## 2023-03-17 DIAGNOSIS — I6523 Occlusion and stenosis of bilateral carotid arteries: Secondary | ICD-10-CM | POA: Diagnosis not present

## 2023-03-17 LAB — CBC WITH DIFFERENTIAL/PLATELET
Basophils Absolute: 0 10*3/uL (ref 0.0–0.2)
Basos: 1 %
EOS (ABSOLUTE): 0.1 10*3/uL (ref 0.0–0.4)
Eos: 1 %
Hematocrit: 37.1 % (ref 34.0–46.6)
Hemoglobin: 12 g/dL (ref 11.1–15.9)
Immature Grans (Abs): 0 10*3/uL (ref 0.0–0.1)
Immature Granulocytes: 0 %
Lymphocytes Absolute: 2.5 10*3/uL (ref 0.7–3.1)
Lymphs: 48 %
MCH: 28.4 pg (ref 26.6–33.0)
MCHC: 32.3 g/dL (ref 31.5–35.7)
MCV: 88 fL (ref 79–97)
Monocytes Absolute: 0.3 10*3/uL (ref 0.1–0.9)
Monocytes: 6 %
Neutrophils Absolute: 2.2 10*3/uL (ref 1.4–7.0)
Neutrophils: 44 %
Platelets: 187 10*3/uL (ref 150–450)
RBC: 4.23 x10E6/uL (ref 3.77–5.28)
RDW: 12.9 % (ref 11.7–15.4)
WBC: 5.1 10*3/uL (ref 3.4–10.8)

## 2023-03-17 LAB — COMPREHENSIVE METABOLIC PANEL
ALT: 22 IU/L (ref 0–32)
AST: 30 IU/L (ref 0–40)
Albumin/Globulin Ratio: 1.2 (ref 1.2–2.2)
Albumin: 3.9 g/dL (ref 3.8–4.8)
Alkaline Phosphatase: 116 IU/L (ref 44–121)
BUN/Creatinine Ratio: 9 — ABNORMAL LOW (ref 12–28)
BUN: 8 mg/dL (ref 8–27)
Bilirubin Total: 0.4 mg/dL (ref 0.0–1.2)
CO2: 26 mmol/L (ref 20–29)
Calcium: 9.3 mg/dL (ref 8.7–10.3)
Chloride: 96 mmol/L (ref 96–106)
Creatinine, Ser: 0.87 mg/dL (ref 0.57–1.00)
Globulin, Total: 3.2 g/dL (ref 1.5–4.5)
Glucose: 61 mg/dL — ABNORMAL LOW (ref 70–99)
Potassium: 3.2 mmol/L — ABNORMAL LOW (ref 3.5–5.2)
Sodium: 138 mmol/L (ref 134–144)
Total Protein: 7.1 g/dL (ref 6.0–8.5)
eGFR: 69 mL/min/{1.73_m2} (ref >59)

## 2023-03-17 LAB — LIPID PANEL
Chol/HDL Ratio: 1.8 ratio (ref 0.0–4.4)
Cholesterol, Total: 144 mg/dL (ref 100–199)
HDL: 81 mg/dL (ref >39)
LDL Chol Calc (NIH): 49 mg/dL (ref 0–99)
Triglycerides: 68 mg/dL (ref 0–149)
VLDL Cholesterol Cal: 14 mg/dL (ref 5–40)

## 2023-03-17 MED ORDER — ATORVASTATIN CALCIUM 20 MG PO TABS: ORAL_TABLET | ORAL | 3 refills | Status: DC

## 2023-03-17 MED ORDER — HYDROCHLOROTHIAZIDE 25 MG PO TABS: ORAL_TABLET | ORAL | 3 refills | Status: DC

## 2023-03-17 NOTE — Patient Instructions (Signed)
Trying Rhinocort nasal spray regularly to help with your allergies and that will probably also help with your eyes

## 2023-03-17 NOTE — Progress Notes (Signed)
Complete physical exam  Patient: [name]   DOB: 1946-05-04   77 y.o. Female  MRN: [id number]  Subjective:    Chief Complaint  Patient presents with   Annual Exam    Fasting. No additional concerns.     [name] is a 77 y.o. female who presents today for a complete physical exam. She reports consuming a general diet. The patient does not participate in regular exercise at present. She generally feels fairly well. She reports sleeping fairly well. She does complain of finger finger on the left index finger however it tends to come and go.  She does have some weight loss and states that she has made some changes in her eating habits.  She does have a previous history of AVM with bleeding and has had a colonoscopy.  Her allergies seem to be under good control.  She is using an OTC medication for that.  She continues to be followed by urology for her underlying bladder cancer.  She also has PVD as well as carotid artery disease.  She is followed by cardiology as well as cardiovascular.  She does continue to smoke but usually less than a half a pack per day.  She has had cataract removed.  She continues on atorvastatin.  She is also taking Plavix and gabapentin both given to her by cardiovascular.  She is to be tolerating all these medicines fairly well.  Otherwise her family and social history as well as health maintenance was reviewed.   Most recent fall risk assessment:    03/17/2023    9:32 AM  Fall Risk   Falls in the past year? 0  Number falls in past yr: 0  Injury with Fall? 0  Risk for fall due to : No Fall Risks  Follow up Falls evaluation completed     Most recent depression screenings:    05/23/2022    9:27 AM 03/03/2022   11:07 AM  PHQ 2/9 Scores  PHQ - 2 Score 0 0  PHQ- 9 Score 4     Vision:Within last year and Dental: Receives regular dental care    Patient Care Team: Ronnald Nian, MD as PCP - General (Family Medicine) Charna Elizabeth, MD  (Gastroenterology) Runell Gess, MD (Cardiology) Nada Libman, MD as Consulting Physician (Vascular Surgery) Artis Delay, MD as Consulting Physician (Hematology and Oncology)   Outpatient Medications Prior to Visit  Medication Sig   acetaminophen (TYLENOL) 500 MG tablet Take 1,000 mg by mouth every 6 (six) hours as needed for mild pain or moderate pain.   aspirin EC 81 MG tablet Take 81 mg by mouth in the morning.   atorvastatin (LIPITOR) 20 MG tablet TAKE 1 TABLET(20 MG) BY MOUTH DAILY   Camphor-Eucalyptus-Menthol (VICKS VAPORUB) 4.73-1.2-2.6 % OINT Apply 1 application topically 3 (three) times daily as needed (leg discomfort).   Cholecalciferol (VITAMIN D) 50 MCG (2000 UT) tablet Take 2,000 Units by mouth every evening.    clopidogrel (PLAVIX) 75 MG tablet TAKE 1 TABLET BY MOUTH EVERY DAY. RESUME 1 TIME YOU NO LONGER HAVE ANY BLEEDING IN YOUR URINE OR STOOL AND HAVE COMPLETED ALL SCHEDULED PROCEDURES   docusate sodium (COLACE) 100 MG capsule Take 200 mg by mouth 2 (two) times daily as needed for mild constipation.   Emollient (CETAPHIL) cream Apply 1 Application topically as needed (dry/irritated skin.).   gabapentin (NEURONTIN) 300 MG capsule Take 1 capsule (300 mg total) by mouth daily.   hydrochlorothiazide (HYDRODIURIL)  25 MG tablet TAKE 1 TABLET(25 MG) BY MOUTH DAILY   hydroxypropyl methylcellulose / hypromellose (ISOPTO TEARS / GONIOVISC) 2.5 % ophthalmic solution Place 1 drop into both eyes 3 (three) times daily as needed (dry/irritated eyes.).   ibuprofen (ADVIL) 200 MG tablet Take 200-400 mg by mouth every 8 (eight) hours as needed (pain.).   Iron-Vitamins (GERITOL PO) Take 20 mLs by mouth daily.   lactose free nutrition (BOOST) LIQD Take 237 mLs by mouth 3 (three) times a week.   triamcinolone cream (KENALOG) 0.1 % Apply 1 application topically 2 (two) times daily. (Patient taking differently: Apply 1 application  topically 2 (two) times daily as needed (skin irritation.).)    No facility-administered medications prior to visit.    Review of Systems  All other systems reviewed and are negative.         Objective:     BP 118/70   Pulse 82   Temp 98 F (36.7 C) (Oral)   Resp 16   Ht 5' 1.5" (1.562 m)   Wt 110 lb 9.6 oz (50.2 kg)   SpO2 98% Comment: room air  BMI 20.56 kg/m    Physical Exam   Alert and in no distress. Tympanic membranes and canals are normal. Pharyngeal area is normal. Neck is supple without adenopathy or thyromegaly. Cardiac exam shows a regular sinus rhythm without murmurs or gallops. Lungs are clear to auscultation.  Exam of the left finger shows full motion without a triggering sensation.    Assessment & Plan:   Routine general medical examination at a health care facility  PAD (peripheral artery disease) (HCC)  Gastric AVM  Aortic atherosclerosis (HCC) - Plan: Lipid panel  Allergic rhinitis due to pollen, unspecified seasonality  Asymptomatic bilateral carotid artery stenosis  Osteopenia, unspecified location  Essential hypertension - Plan: hydrochlorothiazide (HYDRODIURIL) 25 MG tablet, CBC with Differential/Platelet, Comprehensive metabolic panel  PVD (peripheral vascular disease) (HCC) - Plan: atorvastatin (LIPITOR) 20 MG tablet, Lipid panel  Atherosclerosis of native artery of both lower extremities with intermittent claudication (HCC) - Plan: atorvastatin (LIPITOR) 20 MG tablet, Lipid panel  Light smoker  Trigger index finger of left hand  History of bilateral cataract extraction   Immunization History  Administered Date(s) Administered   Fluad Quad(high Dose 65+) 09/22/2019   Influenza Split 11/02/2014   Influenza, High Dose Seasonal PF 11/29/2015, 01/18/2018   Influenza,inj,Quad PF,6+ Mos 08/31/2022   Influenza-Unspecified 11/24/2018   PFIZER Comirnaty(Gray Top)Covid-19 Tri-Sucrose Vaccine 03/21/2021   PFIZER(Purple Top)SARS-COV-2 Vaccination 02/17/2020, 03/12/2020, 10/05/2020   Pfizer  Covid-19 Vaccine Bivalent Booster 4yrs & up 09/12/2021   Pneumococcal Conjugate-13 03/07/2021   Pneumococcal Polysaccharide-23 02/19/2017   Tdap 02/03/2018   Zoster Recombinat (Shingrix) 02/03/2022    Health Maintenance  Topic Date Due   Zoster Vaccines- Shingrix (2 of 2) 03/31/2022   COVID-19 Vaccine (6 - 2023-24 season) 07/18/2022   Medicare Annual Wellness (AWV)  05/24/2023   INFLUENZA VACCINE  06/18/2023   DTaP/Tdap/Td (2 - Td or Tdap) 02/04/2028   Pneumonia Vaccine 93+ Years old  Completed   DEXA SCAN  Completed   Hepatitis C Screening  Completed   HPV VACCINES  Aged Out   COLONOSCOPY (Pts 45-64yrs Insurance coverage will need to be confirmed)  Discontinued    Discussed health benefits of physical activity, and encouraged her to engage in regular exercise appropriate for her age and condition.  Problem List Items Addressed This Visit     Allergic rhinitis due to pollen   Aortic atherosclerosis (HCC)  Carotid stenosis, asymptomatic   Gastric AVM   Osteopenia   PAD (peripheral artery disease) (HCC)   Other Visit Diagnoses     Routine general medical examination at a health care facility    -  Primary   Essential hypertension       PVD (peripheral vascular disease) (HCC)       Atherosclerosis of native artery of both lower extremities with intermittent claudication (HCC)          I discussed the trigger finger with her and at this point no intervention needed.  She will continue to be followed by cardiology as well as cardiovascular and urology.  Briefly discussed smoking cessation with her.  She is to get RSV and her second Shingrix shot.  Continue on her allergy medications.    Sharlot Gowda, MD

## 2023-03-30 DIAGNOSIS — C678 Malignant neoplasm of overlapping sites of bladder: Secondary | ICD-10-CM | POA: Diagnosis not present

## 2023-04-14 DIAGNOSIS — C678 Malignant neoplasm of overlapping sites of bladder: Secondary | ICD-10-CM | POA: Diagnosis not present

## 2023-04-14 DIAGNOSIS — Z5111 Encounter for antineoplastic chemotherapy: Secondary | ICD-10-CM | POA: Diagnosis not present

## 2023-04-21 ENCOUNTER — Telehealth: Payer: Self-pay | Admitting: Family Medicine

## 2023-04-21 NOTE — Telephone Encounter (Signed)
Pt called and wants to know if you will renew her handicaid parking placard Hers now expires in sept, Sent one back in your folder [place] she can barely get around said she was just in there and you know what is going on with

## 2023-04-23 DIAGNOSIS — Z5111 Encounter for antineoplastic chemotherapy: Secondary | ICD-10-CM | POA: Diagnosis not present

## 2023-04-23 DIAGNOSIS — C678 Malignant neoplasm of overlapping sites of bladder: Secondary | ICD-10-CM | POA: Diagnosis not present

## 2023-04-30 DIAGNOSIS — Z5111 Encounter for antineoplastic chemotherapy: Secondary | ICD-10-CM | POA: Diagnosis not present

## 2023-04-30 DIAGNOSIS — C678 Malignant neoplasm of overlapping sites of bladder: Secondary | ICD-10-CM | POA: Diagnosis not present

## 2023-05-18 ENCOUNTER — Other Ambulatory Visit: Payer: Self-pay | Admitting: Family Medicine

## 2023-05-26 ENCOUNTER — Ambulatory Visit: Payer: Medicare HMO | Admitting: Podiatry

## 2023-05-26 VITALS — BP 119/63 | HR 71

## 2023-05-26 DIAGNOSIS — M79675 Pain in left toe(s): Secondary | ICD-10-CM | POA: Diagnosis not present

## 2023-05-26 DIAGNOSIS — L84 Corns and callosities: Secondary | ICD-10-CM

## 2023-05-26 DIAGNOSIS — M79674 Pain in right toe(s): Secondary | ICD-10-CM

## 2023-05-26 DIAGNOSIS — B351 Tinea unguium: Secondary | ICD-10-CM | POA: Diagnosis not present

## 2023-05-26 NOTE — Progress Notes (Signed)
       Subjective:  Patient ID: [name], female    DOB: [birth year],  MRN: [id number]   [name] presents to clinic today for:  Chief Complaint  Patient presents with   Nail Problem    "Cut my toenails."   Callouses    "I have a callus on the side of my right foot."  . Patient notes nails are thick, discolored, elongated and painful in shoegear when trying to ambulate.  She also has a callus on the right posterior plantar heel.  She feels pressure on this when she sleeping.  Denies injury.  PCP is Ronnald Nian, MD.  Allergies  Allergen Reactions   Latex Rash    Review of Systems: Negative except as noted in the HPI.  Objective:  Vitals:   05/26/23 1016  BP: 119/63  Pulse: 56    Tawanna M Kutsch is a pleasant 77 y.o. female in NAD. AAO x 3.  Vascular Examination: Capillary refill time is 3-5 seconds to toes bilateral. Palpable pedal pulses b/l LE. Digital hair present b/l. No pedal edema b/l. Skin temperature gradient WNL b/l. No varicosities b/l. No cyanosis or clubbing noted b/l.   Dermatological Examination: Pedal skin with normal turgor, texture and tone b/l. No open wounds. No interdigital macerations b/l. Toenails x10 are 3mm thick, discolored, dystrophic with subungual debris. There is pain with compression of the nail plates.  They are elongated x10.  Hyperkeratotic lesion on the posterior plantar aspect of the right heel.  No ulceration noted.  No erythema or soft tissue breakdown is seen.  Neurological Examination: Protective sensation intact bilateral LE. Vibratory sensation intact bilateral LE.  Assessment/Plan: 1. Pain due to onychomycosis of toenails of both feet   2. Callus of foot     The mycotic toenails were sharply debrided x10 with sterile nail nippers and a power debriding burr to decrease bulk/thickness and length.  The callus was shaved and sanded with an umbrella bur today.  I recommended ReVitaderm 40 cream for nightly use on the  callus.  Return in about 3 months (around 08/26/2023) for RFC.   Clerance Lav, DPM, FACFAS [facility]     [address] [place], [place] [address]                Office [phone]  Fax [phone]

## 2023-06-02 ENCOUNTER — Ambulatory Visit: Payer: Medicare HMO

## 2023-06-02 VITALS — Ht 61.5 in | Wt 120.0 lb

## 2023-06-02 DIAGNOSIS — Z0000 Encounter for general adult medical examination without abnormal findings: Secondary | ICD-10-CM

## 2023-06-02 NOTE — Patient Instructions (Signed)
[name] , Thank you for taking time to come for your Medicare Wellness Visit. I appreciate your ongoing commitment to your health goals. Please review the following plan we discussed and let me know if I can assist you in the future.   These are the goals we discussed:  Goals      Patient Stated     05/23/2022, wants to gain weight     Patient Stated     06/02/2023, wants to live to [phone]        This is a list of the screening recommended for you and due dates:  Health Maintenance  Topic Date Due   Zoster (Shingles) Vaccine (2 of 2) 03/31/2022   COVID-19 Vaccine (6 - 2023-24 season) 07/18/2022   Flu Shot  06/18/2023   Medicare Annual Wellness Visit  06/01/2024   DTaP/Tdap/Td vaccine (2 - Td or Tdap) 02/04/2028   Pneumonia Vaccine  Completed   DEXA scan (bone density measurement)  Completed   Hepatitis C Screening  Completed   HPV Vaccine  Aged Out   Colon Cancer Screening  Discontinued    Advanced directives: Please bring a copy of your POA (Power of [place]) and/or Living Will to your next appointment.   Conditions/risks identified: none  Next appointment: Follow up in one year for your annual wellness visit    Preventive Care 65 Years and Older, Female Preventive care refers to lifestyle choices and visits with your health care provider that can promote health and wellness. What does preventive care include? A yearly physical exam. This is also called an annual well check. Dental exams once or twice a year. Routine eye exams. Ask your health care provider how often you should have your eyes checked. Personal lifestyle choices, including: Daily care of your teeth and gums. Regular physical activity. Eating a healthy diet. Avoiding tobacco and drug use. Limiting alcohol use. Practicing safe sex. Taking low-dose aspirin every day. Taking vitamin and mineral supplements as recommended by your health care provider. What happens during an annual well check? The  services and screenings done by your health care provider during your annual well check will depend on your age, overall health, lifestyle risk factors, and family history of disease. Counseling  Your health care provider may ask you questions about your: Alcohol use. Tobacco use. Drug use. Emotional well-being. Home and relationship well-being. Sexual activity. Eating habits. History of falls. Memory and ability to understand (cognition). Work and work Astronomer. Reproductive health. Screening  You may have the following tests or measurements: Height, weight, and BMI. Blood pressure. Lipid and cholesterol levels. These may be checked every 5 years, or more frequently if you are over 90 years old. Skin check. Lung cancer screening. You may have this screening every year starting at age 52 if you have a 30-pack-year history of smoking and currently smoke or have quit within the past 15 years. Fecal occult blood test (FOBT) of the stool. You may have this test every year starting at age 81. Flexible sigmoidoscopy or colonoscopy. You may have a sigmoidoscopy every 5 years or a colonoscopy every 10 years starting at age 49. Hepatitis C blood test. Hepatitis B blood test. Sexually transmitted disease (STD) testing. Diabetes screening. This is done by checking your blood sugar (glucose) after you have not eaten for a while (fasting). You may have this done every 1-3 years. Bone density scan. This is done to screen for osteoporosis. You may have this done starting at age 27. Mammogram.  This may be done every 1-2 years. Talk to your health care provider about how often you should have regular mammograms. Talk with your health care provider about your test results, treatment options, and if necessary, the need for more tests. Vaccines  Your health care provider may recommend certain vaccines, such as: Influenza vaccine. This is recommended every year. Tetanus, diphtheria, and acellular  pertussis (Tdap, Td) vaccine. You may need a Td booster every 10 years. Zoster vaccine. You may need this after age 24. Pneumococcal 13-valent conjugate (PCV13) vaccine. One dose is recommended after age 72. Pneumococcal polysaccharide (PPSV23) vaccine. One dose is recommended after age 19. Talk to your health care provider about which screenings and vaccines you need and how often you need them. This information is not intended to replace advice given to you by your health care provider. Make sure you discuss any questions you have with your health care provider. Document Released: 11/30/2015 Document Revised: 07/23/2016 Document Reviewed: 09/04/2015 Elsevier Interactive Patient Education  2017 [employer].  Fall Prevention in the Home Falls can cause injuries. They can happen to people of all ages. There are many things you can do to make your home safe and to help prevent falls. What can I do on the outside of my home? Regularly fix the edges of walkways and driveways and fix any cracks. Remove anything that might make you trip as you walk through a door, such as a raised step or threshold. Trim any bushes or trees on the path to your home. Use bright outdoor lighting. Clear any walking paths of anything that might make someone trip, such as rocks or tools. Regularly check to see if handrails are loose or broken. Make sure that both sides of any steps have handrails. Any raised decks and porches should have guardrails on the edges. Have any leaves, snow, or ice cleared regularly. Use sand or salt on walking paths during winter. Clean up any spills in your garage right away. This includes oil or grease spills. What can I do in the bathroom? Use night lights. Install grab bars by the toilet and in the tub and shower. Do not use towel bars as grab bars. Use non-skid mats or decals in the tub or shower. If you need to sit down in the shower, use a plastic, non-slip stool. Keep the floor  dry. Clean up any water that spills on the floor as soon as it happens. Remove soap buildup in the tub or shower regularly. Attach bath mats securely with double-sided non-slip rug tape. Do not have throw rugs and other things on the floor that can make you trip. What can I do in the bedroom? Use night lights. Make sure that you have a light by your bed that is easy to reach. Do not use any sheets or blankets that are too big for your bed. They should not hang down onto the floor. Have a firm chair that has side arms. You can use this for support while you get dressed. Do not have throw rugs and other things on the floor that can make you trip. What can I do in the kitchen? Clean up any spills right away. Avoid walking on wet floors. Keep items that you use a lot in easy-to-reach places. If you need to reach something above you, use a strong step stool that has a grab bar. Keep electrical cords out of the way. Do not use floor polish or wax that makes floors slippery. If you  must use wax, use non-skid floor wax. Do not have throw rugs and other things on the floor that can make you trip. What can I do with my stairs? Do not leave any items on the stairs. Make sure that there are handrails on both sides of the stairs and use them. Fix handrails that are broken or loose. Make sure that handrails are as long as the stairways. Check any carpeting to make sure that it is firmly attached to the stairs. Fix any carpet that is loose or worn. Avoid having throw rugs at the top or bottom of the stairs. If you do have throw rugs, attach them to the floor with carpet tape. Make sure that you have a light switch at the top of the stairs and the bottom of the stairs. If you do not have them, ask someone to add them for you. What else can I do to help prevent falls? Wear shoes that: Do not have high heels. Have rubber bottoms. Are comfortable and fit you well. Are closed at the toe. Do not wear  sandals. If you use a stepladder: Make sure that it is fully opened. Do not climb a closed stepladder. Make sure that both sides of the stepladder are locked into place. Ask someone to hold it for you, if possible. Clearly mark and make sure that you can see: Any grab bars or handrails. First and last steps. Where the edge of each step is. Use tools that help you move around (mobility aids) if they are needed. These include: Canes. Walkers. Scooters. Crutches. Turn on the lights when you go into a dark area. Replace any light bulbs as soon as they burn out. Set up your furniture so you have a clear path. Avoid moving your furniture around. If any of your floors are uneven, fix them. If there are any pets around you, be aware of where they are. Review your medicines with your doctor. Some medicines can make you feel dizzy. This can increase your chance of falling. Ask your doctor what other things that you can do to help prevent falls. This information is not intended to replace advice given to you by your health care provider. Make sure you discuss any questions you have with your health care provider. Document Released: 08/30/2009 Document Revised: 04/10/2016 Document Reviewed: 12/08/2014 Elsevier Interactive Patient Education  2017 [employer].

## 2023-06-02 NOTE — Progress Notes (Signed)
Subjective:   [name] is a 77 y.o. female who presents for Medicare Annual (Subsequent) preventive examination.  Visit Complete: Virtual  I connected with  [name] on 06/02/23 by a audio enabled telemedicine application and verified that I am speaking with the correct person using two identifiers.  Patient Location: Home  Provider Location: Office/Clinic  I discussed the limitations of evaluation and management by telemedicine. The patient expressed understanding and agreed to proceed.  Per patient no change in vitals since last visit, unable to obtain new vitals due to telehealth visit  Review of Systems     Cardiac Risk Factors include: advanced age (>53men, >73 women);hypertension     Objective:    Today's Vitals   06/02/23 1527  Weight: 120 lb (54.4 kg)  Height: 5' 1.5" (1.562 m)   Body mass index is 22.31 kg/m.     06/02/2023    3:39 PM 06/24/2022    8:47 AM 05/23/2022    9:25 AM 03/03/2022   11:20 AM 10/22/2021    8:23 AM 04/18/2021    3:46 PM 03/07/2021   10:27 AM  Advanced Directives  Does Patient Have a Medical Advance Directive? No No No No No No No  Would patient like information on creating a medical advance directive?  No - Patient declined  No - Patient declined No - Patient declined No - Patient declined Yes (ED - Information included in AVS)    Current Medications (verified) Outpatient Encounter Medications as of 06/02/2023  Medication Sig   aspirin EC 81 MG tablet Take 81 mg by mouth in the morning.   atorvastatin (LIPITOR) 20 MG tablet TAKE 1 TABLET(20 MG) BY MOUTH DAILY   Camphor-Eucalyptus-Menthol (VICKS VAPORUB) 4.73-1.2-2.6 % OINT Apply 1 application topically 3 (three) times daily as needed (leg discomfort).   Cholecalciferol (VITAMIN D) 50 MCG (2000 UT) tablet Take 2,000 Units by mouth every evening.    clopidogrel (PLAVIX) 75 MG tablet TAKE 1 TABLET BY MOUTH EVERY DAY. RESUME 1 TIME YOU NO LONGER HAVE ANY BLEEDING IN YOUR URINE OR  STOOL AND HAVE COMPLETED ALL SCHEDULED PROCEDURES   docusate sodium (COLACE) 100 MG capsule Take 200 mg by mouth 2 (two) times daily as needed for mild constipation.   Emollient (CETAPHIL) cream Apply 1 Application topically as needed (dry/irritated skin.).   gabapentin (NEURONTIN) 300 MG capsule Take 1 capsule (300 mg total) by mouth daily.   hydrochlorothiazide (HYDRODIURIL) 25 MG tablet TAKE 1 TABLET(25 MG) BY MOUTH DAILY   hydroxypropyl methylcellulose / hypromellose (ISOPTO TEARS / GONIOVISC) 2.5 % ophthalmic solution Place 1 drop into both eyes 3 (three) times daily as needed (dry/irritated eyes.).   ibuprofen (ADVIL) 200 MG tablet Take 200-400 mg by mouth every 8 (eight) hours as needed (pain.).   Iron-Vitamins (GERITOL PO) Take 20 mLs by mouth daily.   lactose free nutrition (BOOST) LIQD Take 237 mLs by mouth 3 (three) times a week.   triamcinolone cream (KENALOG) 0.1 % Apply 1 application topically 2 (two) times daily. (Patient taking differently: Apply 1 application  topically 2 (two) times daily as needed (skin irritation.).)   acetaminophen (TYLENOL) 500 MG tablet Take 1,000 mg by mouth every 6 (six) hours as needed for mild pain or moderate pain. (Patient not taking: Reported on 06/02/2023)   [DISCONTINUED] potassium chloride (K-DUR) 10 MEQ tablet Take 2 tablets (20 mEq total) by mouth 2 (two) times daily.   No facility-administered encounter medications on file as of 06/02/2023.  Allergies (verified) Latex   History: Past Medical History:  Diagnosis Date   Abdominal wall hernia    Arthritis    Bladder cancer (HCC)    Blood transfusion    30+ yrs. ago   Cancer (HCC)    Right Breast   Hyperlipidemia    Hypertension    Peripheral vascular disease (HCC)    Seasonal allergies    TIA (transient ischemic attack)    Past Surgical History:  Procedure Laterality Date   ABDOMINAL AORTOGRAM W/LOWER EXTREMITY N/A 01/10/2020   Procedure: ABDOMINAL AORTOGRAM W/LOWER EXTREMITY;   Surgeon: Nada Libman, MD;  Location: [facility];  Service: Cardiovascular;  Laterality: N/A;  Bilateral    ABDOMINAL AORTOGRAM W/LOWER EXTREMITY N/A 06/24/2022   Procedure: ABDOMINAL AORTOGRAM W/LOWER EXTREMITY;  Surgeon: Nada Libman, MD;  Location: [facility];  Service: Cardiovascular;  Laterality: N/A;   ABDOMINAL HYSTERECTOMY     Aortobifemoral BPG  05/15/11   Using a 14 x8 bifurcated Dacron Graft   BREAST SURGERY     right lumpectomy   BREAST SURGERY     multiple breast surgeries   CATARACT EXTRACTION Right 10/2018   COLONOSCOPY  2012   [facility]   CYSTOSCOPY W/ RETROGRADES Bilateral 05/27/2019   Procedure: CYSTOSCOPY WITH BILATERAL RETROGRADE PYELOGRAM;  Surgeon: Crist Fat, MD;  Location: [facility];  Service: Urology;  Laterality: Bilateral;   CYSTOSCOPY WITH BIOPSY N/A 05/27/2019   Procedure: CYSTOSCOPY WITH BLADDER  BIOPSY;  Surgeon: Crist Fat, MD;  Location: [facility];  Service: Urology;  Laterality: N/A;   CYSTOSCOPY WITH BIOPSY Bilateral 10/30/2021   Procedure: CYSTOSCOPY WITH BLADDER BIOPSY AND FULGARATION BILATERAL RETROGRADE PYELOGRAM;  Surgeon: Crist Fat, MD;  Location: [facility];  Service: Urology;  Laterality: Bilateral;   DILATION AND CURETTAGE OF UTERUS     3-4 in late 20"s   ENDARTERECTOMY FEMORAL Left 01/27/2020   REDO OF LEFT ENDARTERECTOMY FEMORAL (Left )   ENDARTERECTOMY FEMORAL Left 01/27/2020   Procedure: REDO OF LEFT ENDARTERECTOMY FEMORAL;  Surgeon: Nada Libman, MD;  Location: [facility];  Service: Vascular;  Laterality: Left;   ENTEROSCOPY Left 04/10/2020   Procedure: ENTEROSCOPY;  Surgeon: Jeani Hawking, MD;  Location: [facility];  Service: Endoscopy;  Laterality: Left;   HEMOSTASIS CLIP PLACEMENT  04/10/2020   Procedure: HEMOSTASIS CLIP PLACEMENT;  Surgeon: Jeani Hawking, MD;  Location: [facility];  Service: Endoscopy;;  3 clips   HERNIA REPAIR     HOT HEMOSTASIS N/A 04/10/2020   Procedure: HOT HEMOSTASIS (ARGON  PLASMA COAGULATION/BICAP);  Surgeon: Jeani Hawking, MD;  Location: [facility];  Service: Endoscopy;  Laterality: N/A;   NM MYOCAR PERF WALL MOTION  03/11/2011   mild to moderate perfusion defect in the apical and apical lateral region c/w an infarct/scar   PATCH ANGIOPLASTY Left 01/27/2020   Procedure: PATCH ANGIOPLASTY USING HEMASHIELD PLATINUM FINESSE PATCH;  Surgeon: Nada Libman, MD;  Location: [facility];  Service: Vascular;  Laterality: Left;   SUBMUCOSAL TATTOO INJECTION  04/10/2020   Procedure: SUBMUCOSAL TATTOO INJECTION;  Surgeon: Jeani Hawking, MD;  Location: [facility];  Service: Endoscopy;;   VASCULAR SURGERY  05/15/11   aobifemoral bypass graft   VENTRAL HERNIA REPAIR  12/26/2011   Procedure: LAPAROSCOPIC VENTRAL HERNIA;  Surgeon: Ardeth Sportsman, MD;  Location: [facility];  Service: General;  Laterality: N/A;  With Mesh   Family History  Problem Relation Age of Onset   Heart attack Sister    Heart attack  Brother    Stroke Sister    Cancer Mother        BREAST, BRAIN   Heart disease Mother    Cancer Paternal Aunt        breast ca   Cancer Paternal Grandmother        unknown ca   Social History   Socioeconomic History   Marital status: Married    Spouse name: Georga Stys   Number of children: Not on file   Years of education: 12   Highest education level: 12th grade  Occupational History   Occupation: Retired  Tobacco Use   Smoking status: Some Days    Current packs/day: 0.50    Average packs/day: 0.5 packs/day for 30.0 years (15.0 ttl pk-yrs)    Types: Cigarettes    Passive exposure: Never   Smokeless tobacco: Never   Tobacco comments:    pt states that she uses the smokeless cig and is trying to quit  Vaping Use   Vaping status: Never Used  Substance and Sexual Activity   Alcohol use: No    Comment: occasional alcohol intake   Drug use: No   Sexual activity: Not Currently    Birth control/protection: Surgical  Other Topics Concern   Not on file  Social  History Narrative   Not on file   Social Determinants of Health   Financial Resource Strain: Low Risk  (06/02/2023)   Overall Financial Resource Strain (CARDIA)    Difficulty of Paying Living Expenses: Not hard at all  Food Insecurity: No Food Insecurity (06/02/2023)   Hunger Vital Sign    Worried About Running Out of Food in the Last Year: Never true    Ran Out of Food in the Last Year: Never true  Transportation Needs: No Transportation Needs (06/02/2023)   PRAPARE - Administrator, Civil Service (Medical): No    Lack of Transportation (Non-Medical): No  Physical Activity: Inactive (06/02/2023)   Exercise Vital Sign    Days of Exercise per Week: 0 days    Minutes of Exercise per Session: 0 min  Stress: No Stress Concern Present (06/02/2023)   [employer] of Occupational Health - Occupational Stress Questionnaire    Feeling of Stress : Only a little  Social Connections: Moderately Isolated (06/02/2023)   Social Connection and Isolation Panel [NHANES]    Frequency of Communication with Friends and Family: More than three times a week    Frequency of Social Gatherings with Friends and Family: Never    Attends Religious Services: Never    Database administrator or Organizations: No    Attends Engineer, structural: Never    Marital Status: Married    Tobacco Counseling Ready to quit: Yes Counseling given: Not Answered Tobacco comments: pt states that she uses the smokeless cig and is trying to quit   Clinical Intake:  Pre-visit preparation completed: Yes  Pain : No/denies pain     Nutritional Status: BMI of 19-24  Normal Nutritional Risks: None Diabetes: No  How often do you need to have someone help you when you read instructions, pamphlets, or other written materials from your doctor or pharmacy?: 1 - Never  Interpreter Needed?: No  Information entered by :: NAllen LPN   Activities of Daily Living    06/02/2023    3:30 PM 03/17/2023     9:36 AM  In your present state of health, do you have any difficulty performing the following activities:  Hearing? 0  1  Vision? 0 0  Difficulty concentrating or making decisions? 1 1  Comment comes back   Walking or climbing stairs? 1 0  Dressing or bathing? 0 0  Doing errands, shopping? 1 1  Comment doesn't drive much   Preparing Food and eating ? N   Using the Toilet? N   In the past six months, have you accidently leaked urine? Y   Comment during bladder treatment   Do you have problems with loss of bowel control? N   Managing your Medications? N   Managing your Finances? N   Housekeeping or managing your Housekeeping? N     Patient Care Team: Ronnald Nian, MD as PCP - General (Family Medicine) Charna Elizabeth, MD (Gastroenterology) Runell Gess, MD (Cardiology) Nada Libman, MD as Consulting Physician (Vascular Surgery) Artis Delay, MD as Consulting Physician (Hematology and Oncology)  Indicate any recent Medical Services you may have received from other than [facility] providers in the past year (date may be approximate).     Assessment:   This is a routine wellness examination for [facility].  Hearing/Vision screen Hearing Screening - Comments:: Denies hearing issues Vision Screening - Comments:: Regular eye exams,   Dietary issues and exercise activities discussed:     Goals Addressed             This Visit's Progress    Patient Stated       06/02/2023, wants to live to 101-102       Depression Screen    06/02/2023    3:42 PM 05/23/2022    9:27 AM 03/03/2022   11:07 AM 04/23/2021   11:53 AM 04/18/2021    3:12 PM 03/07/2021   10:27 AM 05/25/2020   11:16 AM  PHQ 2/9 Scores  PHQ - 2 Score 0 0 0 0 0 1 1  PHQ- 9 Score 0 4         Fall Risk    06/02/2023    3:40 PM 03/17/2023    9:32 AM 05/23/2022    9:27 AM 03/03/2022   11:23 AM 12/02/2021   11:33 AM  Fall Risk   Falls in the past year? 0 0 0 0 0  Number falls in past yr: 0 0 0 0 0  Injury with Fall? 0 0 0  0 0  Risk for fall due to :  No Fall Risks Medication side effect Medication side effect Medication side effect  Follow up Falls prevention discussed;Falls evaluation completed Falls evaluation completed Falls evaluation completed;Education provided;Falls prevention discussed Falls evaluation completed Falls evaluation completed    MEDICARE RISK AT HOME:  Medicare Risk at Home - 06/02/23 1540     Any stairs in or around the home? Yes    If so, are there any without handrails? No    Home free of loose throw rugs in walkways, pet beds, electrical cords, etc? Yes    Adequate lighting in your home to reduce risk of falls? Yes    Life alert? No    Use of a cane, walker or w/c? No    Grab bars in the bathroom? Yes    Shower chair or bench in shower? No    Elevated toilet seat or a handicapped toilet? No             TIMED UP AND GO:  Was the test performed?  No    Cognitive Function:        06/02/2023    3:43 PM  05/23/2022    9:35 AM  6CIT Screen  What Year? 0 points 0 points  What month? 0 points 0 points  What time? 0 points 0 points  Count back from 20 0 points 0 points  Months in reverse 0 points 0 points  Repeat phrase 0 points 6 points  Total Score 0 points 6 points    Immunizations Immunization History  Administered Date(s) Administered   Fluad Quad(high Dose 65+) 09/22/2019   Influenza Split 11/02/2014   Influenza, High Dose Seasonal PF 11/29/2015, 01/18/2018   Influenza,inj,Quad PF,6+ Mos 08/31/2022   Influenza-Unspecified 11/24/2018   PFIZER Comirnaty(Gray Top)Covid-19 Tri-Sucrose Vaccine 03/21/2021   PFIZER(Purple Top)SARS-COV-2 Vaccination 02/17/2020, 03/12/2020, 10/05/2020   Pfizer Covid-19 Vaccine Bivalent Booster 47yrs & up 09/12/2021   Pneumococcal Conjugate-13 03/07/2021   Pneumococcal Polysaccharide-23 02/19/2017   Tdap 02/03/2018   Zoster Recombinant(Shingrix) 02/03/2022    TDAP status: Up to date  Flu Vaccine status: Up to date  Pneumococcal  vaccine status: Up to date  Covid-19 vaccine status: Completed vaccines  Qualifies for Shingles Vaccine? Yes   Zostavax completed No   Shingrix Completed?: needs second dose  Screening Tests Health Maintenance  Topic Date Due   Zoster Vaccines- Shingrix (2 of 2) 03/31/2022   COVID-19 Vaccine (6 - 2023-24 season) 07/18/2022   INFLUENZA VACCINE  06/18/2023   Medicare Annual Wellness (AWV)  06/01/2024   DTaP/Tdap/Td (2 - Td or Tdap) 02/04/2028   Pneumonia Vaccine 21+ Years old  Completed   DEXA SCAN  Completed   Hepatitis C Screening  Completed   HPV VACCINES  Aged Out   Colonoscopy  Discontinued    Health Maintenance  Health Maintenance Due  Topic Date Due   Zoster Vaccines- Shingrix (2 of 2) 03/31/2022   COVID-19 Vaccine (6 - 2023-24 season) 07/18/2022    Colorectal cancer screening: No longer required.   Mammogram status: No longer required due to age.  Bone Density status: Completed 03/10/2023.   Lung Cancer Screening: (Low Dose CT Chest recommended if Age 39-80 years, 20 pack-year currently smoking OR have quit w/in 15years.) does not qualify.   Lung Cancer Screening Referral: no  Additional Screening:  Hepatitis C Screening: does qualify; Completed 11/29/2015  Vision Screening: Recommended annual ophthalmology exams for early detection of glaucoma and other disorders of the eye. Is the patient up to date with their annual eye exam?  Yes  Who is the provider or what is the name of the office in which the patient attends annual eye exams? [facility] If pt is not established with a provider, would they like to be referred to a provider to establish care? No .   Dental Screening: Recommended annual dental exams for proper oral hygiene  Diabetic Foot Exam: n/a  Community Resource Referral / Chronic Care Management: CRR required this visit?  No   CCM required this visit?  No     Plan:     I have personally reviewed and noted the following in the  patient's chart:   Medical and social history Use of alcohol, tobacco or illicit drugs  Current medications and supplements including opioid prescriptions. Patient is not currently taking opioid prescriptions. Functional ability and status Nutritional status Physical activity Advanced directives List of other physicians Hospitalizations, surgeries, and ER visits in previous 12 months Vitals Screenings to include cognitive, depression, and falls Referrals and appointments  In addition, I have reviewed and discussed with patient certain preventive protocols, quality metrics, and best practice recommendations. A written  personalized care plan for preventive services as well as general preventive health recommendations were provided to patient.     Barb Merino, LPN   [id number]   After Visit Summary: (MyChart) Due to this being a telephonic visit, the after visit summary with patients personalized plan was offered to patient via MyChart   Nurse Notes: none

## 2023-08-24 ENCOUNTER — Emergency Department (HOSPITAL_COMMUNITY): Payer: Medicare HMO

## 2023-08-24 ENCOUNTER — Emergency Department (HOSPITAL_COMMUNITY)
Admission: EM | Admit: 2023-08-24 | Discharge: 2023-08-24 | Disposition: A | Discharge status: Home / Self Care | Payer: Medicare HMO | Attending: Emergency Medicine | Admitting: Emergency Medicine

## 2023-08-24 ENCOUNTER — Other Ambulatory Visit: Payer: Self-pay

## 2023-08-24 ENCOUNTER — Encounter (HOSPITAL_COMMUNITY): Payer: Self-pay | Admitting: *Deleted

## 2023-08-24 DIAGNOSIS — M5010 Cervical disc disorder with radiculopathy, unspecified cervical region: Secondary | ICD-10-CM | POA: Diagnosis not present

## 2023-08-24 DIAGNOSIS — M5412 Radiculopathy, cervical region: Secondary | ICD-10-CM | POA: Insufficient documentation

## 2023-08-24 DIAGNOSIS — I10 Essential (primary) hypertension: Secondary | ICD-10-CM | POA: Diagnosis not present

## 2023-08-24 DIAGNOSIS — Z91040 Latex allergy status: Secondary | ICD-10-CM | POA: Diagnosis not present

## 2023-08-24 DIAGNOSIS — M4802 Spinal stenosis, cervical region: Secondary | ICD-10-CM | POA: Diagnosis not present

## 2023-08-24 DIAGNOSIS — S4991XA Unspecified injury of right shoulder and upper arm, initial encounter: Secondary | ICD-10-CM | POA: Diagnosis not present

## 2023-08-24 DIAGNOSIS — M25511 Pain in right shoulder: Secondary | ICD-10-CM | POA: Diagnosis not present

## 2023-08-24 DIAGNOSIS — Z7982 Long term (current) use of aspirin: Secondary | ICD-10-CM | POA: Insufficient documentation

## 2023-08-24 DIAGNOSIS — M779 Enthesopathy, unspecified: Secondary | ICD-10-CM | POA: Diagnosis not present

## 2023-08-24 DIAGNOSIS — M79601 Pain in right arm: Secondary | ICD-10-CM

## 2023-08-24 MED ORDER — DEXAMETHASONE SODIUM PHOSPHATE 10 MG/ML IJ SOLN
10.0000 mg | Freq: Once | INTRAMUSCULAR | Status: AC
  Administered 2023-08-24: 10 mg via INTRAMUSCULAR
  Filled 2023-08-24: qty 1

## 2023-08-24 NOTE — Discharge Instructions (Addendum)
Your evaluated today for right arm pain.  I believe your symptoms may be related to nerve irritation.  Please take the ibuprofen at the levels recommended by your primary care provider for inflammation at home.  The sling is for comfort and may be removed if you are feeling better.  If you continue to have problems I recommend following up with orthopedics for further evaluation and management.

## 2023-08-24 NOTE — ED Triage Notes (Signed)
Right neck pain that radiates down the right arm, pain increases when she tries to lift the right arm. Pain started around 1300 yesterday, she was in the kitchen cooking and went to reach for something when the pain started. Using heating pad and Vicks vapor rub without relief.

## 2023-08-24 NOTE — ED Provider Notes (Cosign Needed Addendum)
[place] EMERGENCY DEPARTMENT AT [facility] Provider Note   CSN: [id number] Arrival date & time: 08/24/23  4098     History  Chief Complaint  Patient presents with   Arm Pain    [name] is a 77 y.o. female.  Patient with past medical history significant for TIA, peripheral artery disease, hypertension, arthritis presents to the emergency department complaining of right arm pain.  Patient states she was cooking at approximately 1 PM and began to have severe pain in the right upper arm when reaching for something.  She states she has pain radiating from the right side of her neck down her shoulder down to the level of the right hand.  Movement exacerbates the pain.  She denies falling.  She denies any known injury.   Arm Pain       Home Medications Prior to Admission medications   Medication Sig Start Date End Date Taking? Authorizing Provider  acetaminophen (TYLENOL) 500 MG tablet Take 1,000 mg by mouth every 6 (six) hours as needed for mild pain or moderate pain. Patient not taking: Reported on 06/02/2023    [provider]  aspirin EC 81 MG tablet Take 81 mg by mouth in the morning.    [provider]  atorvastatin (LIPITOR) 20 MG tablet TAKE 1 TABLET(20 MG) BY MOUTH DAILY 03/17/23   Ronnald Nian, MD  Camphor-Eucalyptus-Menthol (VICKS VAPORUB) 4.73-1.2-2.6 % OINT Apply 1 application topically 3 (three) times daily as needed (leg discomfort).    [provider]  Cholecalciferol (VITAMIN D) 50 MCG (2000 UT) tablet Take 2,000 Units by mouth every evening.     [provider]  clopidogrel (PLAVIX) 75 MG tablet TAKE 1 TABLET BY MOUTH EVERY DAY. RESUME 1 TIME YOU NO LONGER HAVE ANY BLEEDING IN YOUR URINE OR STOOL AND HAVE COMPLETED ALL SCHEDULED PROCEDURES 05/18/23   Ronnald Nian, MD  docusate sodium (COLACE) 100 MG capsule Take 200 mg by mouth 2 (two) times daily as needed for mild constipation.    [provider]   Emollient (CETAPHIL) cream Apply 1 Application topically as needed (dry/irritated skin.).    [provider]  gabapentin (NEURONTIN) 300 MG capsule Take 1 capsule (300 mg total) by mouth daily. 08/04/22   Nada Libman, MD  hydrochlorothiazide (HYDRODIURIL) 25 MG tablet TAKE 1 TABLET(25 MG) BY MOUTH DAILY 03/17/23   Ronnald Nian, MD  hydroxypropyl methylcellulose / hypromellose (ISOPTO TEARS / GONIOVISC) 2.5 % ophthalmic solution Place 1 drop into both eyes 3 (three) times daily as needed (dry/irritated eyes.).    [provider]  ibuprofen (ADVIL) 200 MG tablet Take 200-400 mg by mouth every 8 (eight) hours as needed (pain.).    [provider]  Iron-Vitamins (GERITOL PO) Take 20 mLs by mouth daily.    [provider]  lactose free nutrition (BOOST) LIQD Take 237 mLs by mouth 3 (three) times a week.    [provider]  triamcinolone cream (KENALOG) 0.1 % Apply 1 application topically 2 (two) times daily. Patient taking differently: Apply 1 application  topically 2 (two) times daily as needed (skin irritation.). 04/05/21   Tysinger, Kermit Balo, PA-C  potassium chloride (K-DUR) 10 MEQ tablet Take 2 tablets (20 mEq total) by mouth 2 (two) times daily. 12/19/11 01/27/12  Karie Soda, MD      Allergies    Latex    Review of Systems   Review of Systems  Physical Exam Updated Vital Signs BP  120/72 (BP Location: Left Arm)   Pulse 77   Temp 97.9 F (36.6 C) (Oral)   Resp 15   SpO2 96%  Physical Exam Vitals and nursing note reviewed.  HENT:     Head: Normocephalic and atraumatic.  Eyes:     Conjunctiva/sclera: Conjunctivae normal.  Pulmonary:     Effort: Pulmonary effort is normal. No respiratory distress.  Musculoskeletal:        General: Tenderness present. No swelling, deformity or signs of injury.     Cervical back: Normal range of motion and neck supple. No rigidity or tenderness.     Comments: Patient able to move her right arm through  normal range of motion exercises.  She complains of pain with all active movements at the right shoulder.  She has some mild tenderness to palpation in the general right shoulder region laterally.  No deformity noted.  Sensation intact throughout right upper extremity.  Grip strength equal bilaterally.  Skin:    General: Skin is dry.  Neurological:     Mental Status: She is alert.  Psychiatric:        Speech: Speech normal.        Behavior: Behavior normal.     ED Results / Procedures / Treatments   Labs (all labs ordered are listed, but only abnormal results are displayed) Labs Reviewed - No data to display  EKG None  Radiology DG Shoulder Right  Result Date: 08/24/2023 CLINICAL DATA:  Right shoulder pain. EXAM: RIGHT SHOULDER - 2+ VIEW COMPARISON:  None Available. FINDINGS: Hazy calcification at the distal rotator cuff. No acute fracture, subluxation, or significant degenerative spurring. Dystrophic type calcification overlapping the right chest wall where there are signs of prior breast surgery and axillary dissection. Remote history of right breast surgery. IMPRESSION: 1. No acute fracture or malalignment. 2. Calcific tendinitis of the rotator cuff. Electronically Signed   By: Tiburcio Pea M.D.   On: 08/24/2023 05:53   CT Cervical Spine Wo Contrast  Result Date: 08/24/2023 CLINICAL DATA:  Cervical radiculopathy. Neck pain radiating down the right arm EXAM: CT CERVICAL SPINE WITHOUT CONTRAST TECHNIQUE: Multidetector CT imaging of the cervical spine was performed without intravenous contrast. Multiplanar CT image reconstructions were also generated. RADIATION DOSE REDUCTION: This exam was performed according to the departmental dose-optimization program which includes automated exposure control, adjustment of the mA and/or kV according to patient size and/or use of iterative reconstruction technique. COMPARISON:  None Available. FINDINGS: Alignment: Straightening of cervical lordosis  Skull base and vertebrae: No acute fracture. No primary bone lesion or focal pathologic process. Soft tissues and spinal canal: No prevertebral fluid or swelling. No visible canal hematoma. Disc levels: Generalized degenerative endplate and facet spurring. Disc height loss is greatest at C4-5 and below. Degenerative foraminal stenosis on the right at C4-5 to C7-T1, greatest at C6-7. On the left, foraminal stenosis is even more diffuse and most advanced at C3-4. Upper chest: Clear apical lungs IMPRESSION: 1. No acute finding. 2. Foraminal narrowings greatest on the symptomatic right side at C5-6. Electronically Signed   By: Tiburcio Pea M.D.   On: 08/24/2023 05:52    Procedures Procedures    Medications Ordered in ED Medications  dexamethasone (DECADRON) injection 10 mg (has no administration in time range)    ED Course/ Medical Decision Making/ A&P  Medical Decision Making Amount and/or Complexity of Data Reviewed Radiology: ordered.  Risk Prescription drug management.   This patient presents to the ED for concern of right arm pain, this involves an extensive number of treatment options, and is a complaint that carries with it a high risk of complications and morbidity.  The differential diagnosis includes fracture, dislocation, soft tissue injury, radiculopathy, others   Co morbidities that complicate the patient evaluation  Arthritis, TIA   Additional history obtained:  Additional history obtained from family at bedside   Imaging Studies ordered:  I ordered imaging studies including CT of the cervical spine, plain films of the right shoulder I independently visualized and interpreted imaging which showed no acute fracture or dislocation of the right shoulder.  CT shows  1. No acute finding.  2. Foraminal narrowings greatest on the symptomatic right side at  C5-6.   I agree with the radiologist interpretation  Problem List / ED Course /  Critical interventions / Medication management   I ordered medication including Decadron for inflammation Reevaluation of the patient after these medicines showed that the patient improved I have reviewed the patients home medicines and have made adjustments as needed   Test / Admission - Considered:  Patient's symptoms seem most consistent with cervical radiculopathy.  Patient has normal range of motion and normal sensation.  She describes a pain that radiates from the level of the neck down the arm down to the hand.  The shoulder x-ray did show some calcification of tendons of the rotator cuff.  Question if patient has old injuries in the rotator cuff and could possibly have rotator cuff etiology as well.  Patient placed in sling for comfort.  Plan to discharge home at this time with recommendations for follow-up with orthopedics for further evaluation as needed.  Return precautions provided.         Final Clinical Impression(s) / ED Diagnoses Final diagnoses:  Cervical radiculopathy  Right arm pain    Rx / DC Orders ED Discharge Orders     None         Pamala Duffel 08/24/23 0616    Darrick Grinder, PA-C 08/24/23 0617    Nira Conn, MD 08/25/23 [phone]

## 2023-09-25 ENCOUNTER — Telehealth: Payer: Self-pay

## 2023-09-25 NOTE — Telephone Encounter (Signed)
Transition Care Management Unsuccessful Follow-up Telephone Call  Date of discharge and from where:  [name]  Attempts:  1st Attempt  Reason for unsuccessful TCM follow-up call:  No answer/busy   [name] [place]  [facility], [facility] Guide, Phone: [phone] Website: [email or web address]

## 2023-09-25 NOTE — Telephone Encounter (Signed)
Transition Care Management Unsuccessful Follow-up Telephone Call  Date of discharge and from where:  [name]  Attempts:  2nd Attempt  Reason for unsuccessful TCM follow-up call:  No answer/busy   [name] [place]  [facility], [facility] Guide, Phone: [phone] Website: [email or web address]

## 2023-09-28 DIAGNOSIS — C678 Malignant neoplasm of overlapping sites of bladder: Secondary | ICD-10-CM | POA: Diagnosis not present

## 2023-10-05 ENCOUNTER — Encounter: Payer: Medicare HMO | Admitting: Podiatry

## 2023-10-05 NOTE — Progress Notes (Signed)
Patient was a no-show for this morning's scheduled appointment.

## 2023-10-19 ENCOUNTER — Other Ambulatory Visit: Payer: Self-pay | Admitting: Family Medicine

## 2023-10-29 DIAGNOSIS — C678 Malignant neoplasm of overlapping sites of bladder: Secondary | ICD-10-CM | POA: Diagnosis not present

## 2023-11-05 DIAGNOSIS — C678 Malignant neoplasm of overlapping sites of bladder: Secondary | ICD-10-CM | POA: Diagnosis not present

## 2023-11-12 DIAGNOSIS — C678 Malignant neoplasm of overlapping sites of bladder: Secondary | ICD-10-CM | POA: Diagnosis not present

## 2023-11-12 DIAGNOSIS — Z5111 Encounter for antineoplastic chemotherapy: Secondary | ICD-10-CM | POA: Diagnosis not present

## 2024-01-12 ENCOUNTER — Encounter: Payer: Self-pay | Admitting: Internal Medicine

## 2024-03-01 DIAGNOSIS — H1045 Other chronic allergic conjunctivitis: Secondary | ICD-10-CM | POA: Diagnosis not present

## 2024-03-01 DIAGNOSIS — H35033 Hypertensive retinopathy, bilateral: Secondary | ICD-10-CM | POA: Diagnosis not present

## 2024-03-01 DIAGNOSIS — H26493 Other secondary cataract, bilateral: Secondary | ICD-10-CM | POA: Diagnosis not present

## 2024-03-01 DIAGNOSIS — H35363 Drusen (degenerative) of macula, bilateral: Secondary | ICD-10-CM | POA: Diagnosis not present

## 2024-03-01 DIAGNOSIS — H04123 Dry eye syndrome of bilateral lacrimal glands: Secondary | ICD-10-CM | POA: Diagnosis not present

## 2024-03-28 ENCOUNTER — Other Ambulatory Visit: Payer: Self-pay | Admitting: Family Medicine

## 2024-03-28 DIAGNOSIS — I739 Peripheral vascular disease, unspecified: Secondary | ICD-10-CM

## 2024-03-28 DIAGNOSIS — I70213 Atherosclerosis of native arteries of extremities with intermittent claudication, bilateral legs: Secondary | ICD-10-CM

## 2024-03-29 ENCOUNTER — Ambulatory Visit: Payer: Medicare HMO | Admitting: Family Medicine

## 2024-03-29 DIAGNOSIS — C678 Malignant neoplasm of overlapping sites of bladder: Secondary | ICD-10-CM | POA: Diagnosis not present

## 2024-04-14 ENCOUNTER — Telehealth: Payer: Self-pay

## 2024-04-14 DIAGNOSIS — C678 Malignant neoplasm of overlapping sites of bladder: Secondary | ICD-10-CM | POA: Diagnosis not present

## 2024-04-14 NOTE — Telephone Encounter (Signed)
 Copied from CRM [id number]. Topic: Clinical - Medical Advice >> Apr 14, 2024  1:31 PM Carlatta H wrote: Reason for CRM: Please call patient about the hardening of breast//Occasionally she feels sharp pains but it doesn't last//   Talked to patient. She has an appointment on 6/10.

## 2024-04-22 DIAGNOSIS — C678 Malignant neoplasm of overlapping sites of bladder: Secondary | ICD-10-CM | POA: Diagnosis not present

## 2024-04-26 ENCOUNTER — Ambulatory Visit (INDEPENDENT_AMBULATORY_CARE_PROVIDER_SITE_OTHER): Admitting: Family Medicine

## 2024-04-26 VITALS — BP 110/68 | HR 76 | Wt 111.6 lb

## 2024-04-26 DIAGNOSIS — Z853 Personal history of malignant neoplasm of breast: Secondary | ICD-10-CM | POA: Diagnosis not present

## 2024-04-26 DIAGNOSIS — N6459 Other signs and symptoms in breast: Secondary | ICD-10-CM

## 2024-04-26 NOTE — Progress Notes (Signed)
   Subjective:    Patient ID: [name], female    DOB: Sep 01, 1946, 78 y.o.   MRN: [id number]  HPI She is here for evaluation of a lump in her right breast.  She was diagnosed with breast cancer in 2002 and most recently has been followed with mammograms.  Her last mammogram was in 2022 and was negative.  She recently noted a lump in there right upper outer quadrant of her breast.   Review of Systems     Objective:    Physical Exam Exam of the right breast does show mass effect of 4 to 5 cm in the upper outer quadrant as well as mid quadrant of the right breast.       Assessment & Plan:  History of breast cancer - Plan: MM Digital Diagnostic Unilat L  Abnormal breast exam - Plan: MM Digital Diagnostic Unilat L

## 2024-04-29 DIAGNOSIS — C678 Malignant neoplasm of overlapping sites of bladder: Secondary | ICD-10-CM | POA: Diagnosis not present

## 2024-05-02 ENCOUNTER — Telehealth: Payer: Self-pay

## 2024-05-02 ENCOUNTER — Other Ambulatory Visit: Payer: Self-pay | Admitting: Family Medicine

## 2024-05-02 DIAGNOSIS — Z853 Personal history of malignant neoplasm of breast: Secondary | ICD-10-CM

## 2024-05-02 DIAGNOSIS — N6459 Other signs and symptoms in breast: Secondary | ICD-10-CM

## 2024-05-02 DIAGNOSIS — N6311 Unspecified lump in the right breast, upper outer quadrant: Secondary | ICD-10-CM

## 2024-05-02 NOTE — Telephone Encounter (Signed)
 Copied from CRM [id number]. Topic: General - Other >> May 02, 2024 10:03 AM Adaline Holly wrote: Twila Gale with [facility] called to see if Dr. Robina Chol would cosign the imaging orders for patients bilateral mammogram scheduled for 6/17. Please advise.

## 2024-05-03 ENCOUNTER — Ambulatory Visit
Admission: RE | Admit: 2024-05-03 | Discharge: 2024-05-03 | Discharge status: Home / Self Care | Source: Ambulatory Visit | Attending: Family Medicine | Admitting: Family Medicine

## 2024-05-03 ENCOUNTER — Ambulatory Visit

## 2024-05-03 DIAGNOSIS — R921 Mammographic calcification found on diagnostic imaging of breast: Secondary | ICD-10-CM | POA: Diagnosis not present

## 2024-05-03 DIAGNOSIS — Z853 Personal history of malignant neoplasm of breast: Secondary | ICD-10-CM | POA: Diagnosis not present

## 2024-06-04 ENCOUNTER — Other Ambulatory Visit: Payer: Self-pay | Admitting: Family Medicine

## 2024-06-04 DIAGNOSIS — I10 Essential (primary) hypertension: Secondary | ICD-10-CM

## 2024-06-07 ENCOUNTER — Ambulatory Visit: Payer: Medicare HMO

## 2024-06-07 DIAGNOSIS — Z0000 Encounter for general adult medical examination without abnormal findings: Secondary | ICD-10-CM

## 2024-06-07 NOTE — Progress Notes (Signed)
 Subjective:   [name] is a 78 y.o. who presents for a Medicare Wellness preventive visit.  As a reminder, Annual Wellness Visits don't include a physical exam, and some assessments may be limited, especially if this visit is performed virtually. We may recommend an in-person follow-up visit with your provider if needed.  Visit Complete: Virtual I connected with  [name] on 06/07/24 by a audio enabled telemedicine application and verified that I am speaking with the correct person using two identifiers.  Patient Location: Home  Provider Location: Office/Clinic  I discussed the limitations of evaluation and management by telemedicine. The patient expressed understanding and agreed to proceed.  Vital Signs: Because this visit was a virtual/telehealth visit, some criteria may be missing or patient reported. Any vitals not documented were not able to be obtained and vitals that have been documented are patient reported.  VideoError- Librarian, academic were attempted between this provider and patient, however failed, due to patient having technical difficulties OR patient did not have access to video capability.  We continued and completed visit with audio only.   Persons Participating in Visit: Patient.  AWV Questionnaire: No: Patient Medicare AWV questionnaire was not completed prior to this visit.  Cardiac Risk Factors include: advanced age (>44men, >80 women);hypertension     Objective:    Today's Vitals   There is no height or weight on file to calculate BMI.     06/07/2024    3:39 PM 08/24/2023    4:35 AM 06/02/2023    3:39 PM 06/24/2022    8:47 AM 05/23/2022    9:25 AM 03/03/2022   11:20 AM 10/22/2021    8:23 AM  Advanced Directives  Does Patient Have a Medical Advance Directive? No No No No No No No  Would patient like information on creating a medical advance directive?  Yes (ED - Information included in AVS)  No - Patient declined   No - Patient declined No - Patient declined    Current Medications (verified) Outpatient Encounter Medications as of 06/07/2024  Medication Sig   aspirin  EC 81 MG tablet Take 81 mg by mouth in the morning. (Patient taking differently: Take 81 mg by mouth in the morning. Takes every other day)   atorvastatin  (LIPITOR) 20 MG tablet TAKE 1 TABLET BY MOUTH EVERY DAY   Camphor-Eucalyptus-Menthol (VICKS VAPORUB) 4.73-1.2-2.6 % OINT Apply 1 application topically 3 (three) times daily as needed (leg discomfort).   Cholecalciferol (VITAMIN D) 50 MCG (2000 UT) tablet Take 2,000 Units by mouth every evening.    clopidogrel  (PLAVIX ) 75 MG tablet TAKE 1 TABLET BY MOUTH EVERY DAY. RESUME 1 TIME YOU NO LONGER HAVE ANY BLEEDING IN YOUR URINE OR STOOL AND HAVE COMPLETED ALL SCHEDULED PROCEDURES   docusate sodium  (COLACE) 100 MG capsule Take 200 mg by mouth 2 (two) times daily as needed for mild constipation.   Emollient (CETAPHIL) cream Apply 1 Application topically as needed (dry/irritated skin.).   hydrochlorothiazide  (HYDRODIURIL ) 25 MG tablet TAKE 1 TABLET BY MOUTH EVERY DAY   ibuprofen (ADVIL) 200 MG tablet Take 200-400 mg by mouth every 8 (eight) hours as needed (pain.).   Iron-Vitamins (GERITOL PO) Take 20 mLs by mouth daily.   lactose free nutrition (BOOST) LIQD Take 237 mLs by mouth 3 (three) times a week.   acetaminophen  (TYLENOL ) 500 MG tablet Take 1,000 mg by mouth every 6 (six) hours as needed for mild pain or moderate pain. (Patient not taking: Reported on  06/07/2024)   gabapentin  (NEURONTIN ) 300 MG capsule Take 1 capsule (300 mg total) by mouth daily. (Patient not taking: Reported on 06/07/2024)   hydroxypropyl methylcellulose / hypromellose (ISOPTO TEARS / GONIOVISC) 2.5 % ophthalmic solution Place 1 drop into both eyes 3 (three) times daily as needed (dry/irritated eyes.). (Patient not taking: Reported on 06/07/2024)   triamcinolone  cream (KENALOG ) 0.1 % Apply 1 application topically 2 (two) times  daily. (Patient not taking: Reported on 06/07/2024)   [DISCONTINUED] potassium chloride  (K-DUR) 10 MEQ tablet Take 2 tablets (20 mEq total) by mouth 2 (two) times daily.   No facility-administered encounter medications on file as of 06/07/2024.    Allergies (verified) Latex   History: Past Medical History:  Diagnosis Date   Abdominal wall hernia    Arthritis    Bladder cancer (HCC)    Blood transfusion    30+ yrs. ago   Cancer (HCC)    Right Breast   Hyperlipidemia    Hypertension    Peripheral vascular disease (HCC)    Seasonal allergies    TIA (transient ischemic attack)    Past Surgical History:  Procedure Laterality Date   ABDOMINAL AORTOGRAM W/LOWER EXTREMITY N/A 01/10/2020   Procedure: ABDOMINAL AORTOGRAM W/LOWER EXTREMITY;  Surgeon: Serene Gaile ORN, MD;  Location: [facility];  Service: Cardiovascular;  Laterality: N/A;  Bilateral    ABDOMINAL AORTOGRAM W/LOWER EXTREMITY N/A 06/24/2022   Procedure: ABDOMINAL AORTOGRAM W/LOWER EXTREMITY;  Surgeon: Serene Gaile ORN, MD;  Location: [facility];  Service: Cardiovascular;  Laterality: N/A;   ABDOMINAL HYSTERECTOMY     Aortobifemoral BPG  05/15/11   Using a 14 x8 bifurcated Dacron Graft   BREAST SURGERY     right lumpectomy   BREAST SURGERY     multiple breast surgeries   CATARACT EXTRACTION Right 10/2018   COLONOSCOPY  2012   [facility]   CYSTOSCOPY W/ RETROGRADES Bilateral 05/27/2019   Procedure: CYSTOSCOPY WITH BILATERAL RETROGRADE PYELOGRAM;  Surgeon: Cam Morene ORN, MD;  Location: [facility];  Service: Urology;  Laterality: Bilateral;   CYSTOSCOPY WITH BIOPSY N/A 05/27/2019   Procedure: CYSTOSCOPY WITH BLADDER  BIOPSY;  Surgeon: Cam Morene ORN, MD;  Location: [facility];  Service: Urology;  Laterality: N/A;   CYSTOSCOPY WITH BIOPSY Bilateral 10/30/2021   Procedure: CYSTOSCOPY WITH BLADDER BIOPSY AND FULGARATION BILATERAL RETROGRADE PYELOGRAM;  Surgeon: Cam Morene ORN, MD;  Location: [facility];  Service:  Urology;  Laterality: Bilateral;   DILATION AND CURETTAGE OF UTERUS     3-4 in late 20s   ENDARTERECTOMY FEMORAL Left 01/27/2020   REDO OF LEFT ENDARTERECTOMY FEMORAL (Left )   ENDARTERECTOMY FEMORAL Left 01/27/2020   Procedure: REDO OF LEFT ENDARTERECTOMY FEMORAL;  Surgeon: Serene Gaile ORN, MD;  Location: [facility];  Service: Vascular;  Laterality: Left;   ENTEROSCOPY Left 04/10/2020   Procedure: ENTEROSCOPY;  Surgeon: Rollin Dover, MD;  Location: [facility];  Service: Endoscopy;  Laterality: Left;   HEMOSTASIS CLIP PLACEMENT  04/10/2020   Procedure: HEMOSTASIS CLIP PLACEMENT;  Surgeon: Rollin Dover, MD;  Location: [facility];  Service: Endoscopy;;  3 clips   HERNIA REPAIR     HOT HEMOSTASIS N/A 04/10/2020   Procedure: HOT HEMOSTASIS (ARGON PLASMA COAGULATION/BICAP);  Surgeon: Rollin Dover, MD;  Location: [facility];  Service: Endoscopy;  Laterality: N/A;   NM MYOCAR PERF WALL MOTION  03/11/2011   mild to moderate perfusion defect in the apical and apical lateral region c/w an infarct/scar   PATCH ANGIOPLASTY Left 01/27/2020  Procedure: PATCH ANGIOPLASTY USING HEMASHIELD PLATINUM FINESSE PATCH;  Surgeon: Serene Gaile ORN, MD;  Location: [facility];  Service: Vascular;  Laterality: Left;   SUBMUCOSAL TATTOO INJECTION  04/10/2020   Procedure: SUBMUCOSAL TATTOO INJECTION;  Surgeon: Rollin Dover, MD;  Location: [facility];  Service: Endoscopy;;   VASCULAR SURGERY  05/15/11   aobifemoral bypass graft   VENTRAL HERNIA REPAIR  12/26/2011   Procedure: LAPAROSCOPIC VENTRAL HERNIA;  Surgeon: Elspeth KYM Schultze, MD;  Location: [facility];  Service: General;  Laterality: N/A;  With Mesh   Family History  Problem Relation Age of Onset   Heart attack Sister    Heart attack Brother    Stroke Sister    Cancer Mother        BREAST, BRAIN   Heart disease Mother    Cancer Paternal Aunt        breast ca   Cancer Paternal Grandmother        unknown ca   Social History   Socioeconomic History   Marital  status: Married    Spouse name: Aaron Boeh   Number of children: Not on file   Years of education: 12   Highest education level: 12th grade  Occupational History   Occupation: Retired  Tobacco Use   Smoking status: Some Days    Current packs/day: 0.50    Average packs/day: 0.5 packs/day for 30.0 years (15.0 ttl pk-yrs)    Types: Cigarettes    Passive exposure: Never   Smokeless tobacco: Never   Tobacco comments:    pt states that she uses the smokeless cig and is trying to quit  Vaping Use   Vaping status: Never Used  Substance and Sexual Activity   Alcohol use: No    Comment: occasional alcohol intake   Drug use: No   Sexual activity: Not Currently    Birth control/protection: Surgical  Other Topics Concern   Not on file  Social History Narrative   Not on file   Social Drivers of Health   Financial Resource Strain: Low Risk  (06/07/2024)   Overall Financial Resource Strain (CARDIA)    Difficulty of Paying Living Expenses: Not hard at all  Food Insecurity: No Food Insecurity (06/07/2024)   Hunger Vital Sign    Worried About Running Out of Food in the Last Year: Never true    Ran Out of Food in the Last Year: Never true  Transportation Needs: No Transportation Needs (06/07/2024)   PRAPARE - Administrator, Civil Service (Medical): No    Lack of Transportation (Non-Medical): No  Physical Activity: Insufficiently Active (06/07/2024)   Exercise Vital Sign    Days of Exercise per Week: 3 days    Minutes of Exercise per Session: 20 min  Stress: No Stress Concern Present (06/07/2024)   [employer] of Occupational Health - Occupational Stress Questionnaire    Feeling of Stress: Not at all  Social Connections: Moderately Isolated (06/07/2024)   Social Connection and Isolation Panel    Frequency of Communication with Friends and Family: More than three times a week    Frequency of Social Gatherings with Friends and Family: More than three times a week     Attends Religious Services: Never    Database administrator or Organizations: No    Attends Banker Meetings: Never    Marital Status: Married    Tobacco Counseling Ready to quit: Not Answered Counseling given: Not Answered Tobacco comments: pt states that she uses  the smokeless cig and is trying to quit    Clinical Intake:  Pre-visit preparation completed: Yes  Pain : No/denies pain     Nutritional Risks: None Diabetes: No  No results found for: HGBA1C   How often do you need to have someone help you when you read instructions, pamphlets, or other written materials from your doctor or pharmacy?: 1 - Never  Interpreter Needed?: No  Information entered by :: NAllen LPN   Activities of Daily Living     06/07/2024    3:28 PM  In your present state of health, do you have any difficulty performing the following activities:  Hearing? 0  Vision? 0  Difficulty concentrating or making decisions? 0  Walking or climbing stairs? 0  Dressing or bathing? 0  Doing errands, shopping? 1  Comment does not drive much  Preparing Food and eating ? N  Using the Toilet? N  In the past six months, have you accidently leaked urine? N  Do you have problems with loss of bowel control? N  Managing your Medications? N  Managing your Finances? N  Housekeeping or managing your Housekeeping? N    Patient Care Team: Joyce Norleen BROCKS, MD as PCP - General (Family Medicine) Kristie Lamprey, MD (Gastroenterology) Court Dorn PARAS, MD (Cardiology) Serene Gaile ORN, MD as Consulting Physician (Vascular Surgery) Lonn Hicks, MD as Consulting Physician (Hematology and Oncology) [facility], [facility]  I have updated your Care Teams any recent Medical Services you may have received from other providers in the past year.     Assessment:   This is a routine wellness examination for [facility].  Hearing/Vision screen Hearing Screening - Comments:: Denies hearing issues Vision  Screening - Comments:: Regular eye exams, [facility]    Goals Addressed             This Visit's Progress    Patient Stated       06/07/2024, quit smoking       Depression Screen     06/07/2024    3:42 PM 06/02/2023    3:42 PM 05/23/2022    9:27 AM 03/03/2022   11:07 AM 04/23/2021   11:53 AM 04/18/2021    3:12 PM 03/07/2021   10:27 AM  PHQ 2/9 Scores  PHQ - 2 Score 0 0 0 0 0 0 1  PHQ- 9 Score 5 0 4        Fall Risk     06/07/2024    3:41 PM 06/02/2023    3:40 PM 03/17/2023    9:32 AM 05/23/2022    9:27 AM 03/03/2022   11:23 AM  Fall Risk   Falls in the past year? 0 0 0 0 0  Number falls in past yr: 0 0 0 0 0  Injury with Fall? 0 0 0 0 0  Risk for fall due to : Medication side effect  No Fall Risks Medication side effect Medication side effect  Follow up Falls evaluation completed;Falls prevention discussed Falls prevention discussed;Falls evaluation completed Falls evaluation completed Falls evaluation completed;Education provided;Falls prevention discussed  Falls evaluation completed      Data saved with a previous flowsheet row definition    MEDICARE RISK AT HOME:  Medicare Risk at Home Any stairs in or around the home?: Yes If so, are there any without handrails?: No Home free of loose throw rugs in walkways, pet beds, electrical cords, etc?: Yes Adequate lighting in your home to reduce risk of falls?: Yes Life alert?: No  Use of a cane, walker or w/c?: No Grab bars in the bathroom?: Yes Shower chair or bench in shower?: No Elevated toilet seat or a handicapped toilet?: No  TIMED UP AND GO:  Was the test performed?  No  Cognitive Function: 6CIT completed        06/07/2024    3:45 PM 06/02/2023    3:43 PM 05/23/2022    9:35 AM  6CIT Screen  What Year? 0 points 0 points 0 points  What month? 0 points 0 points 0 points  What time? 0 points 0 points 0 points  Count back from 20 0 points 0 points 0 points  Months in reverse 2 points 0 points 0 points  Repeat  phrase 0 points 0 points 6 points  Total Score 2 points 0 points 6 points    Immunizations Immunization History  Administered Date(s) Administered   Fluad Quad(high Dose 65+) 09/22/2019   Influenza Split 11/02/2014   Influenza, High Dose Seasonal PF 11/29/2015, 01/18/2018   Influenza,inj,Quad PF,6+ Mos 08/31/2022   Influenza-Unspecified 11/24/2018   PFIZER Comirnaty(Gray Top)Covid-19 Tri-Sucrose Vaccine 03/21/2021   PFIZER(Purple Top)SARS-COV-2 Vaccination 02/17/2020, 03/12/2020, 10/05/2020   Pfizer Covid-19 Vaccine Bivalent Booster 26yrs & up 09/12/2021   Pneumococcal Conjugate-13 03/07/2021   Pneumococcal Polysaccharide-23 02/19/2017   Tdap 02/03/2018   Zoster Recombinant(Shingrix) 02/03/2022    Screening Tests Health Maintenance  Topic Date Due   Zoster Vaccines- Shingrix (2 of 2) 03/31/2022   COVID-19 Vaccine (6 - 2024-25 season) 07/19/2023   INFLUENZA VACCINE  06/17/2024   Medicare Annual Wellness (AWV)  06/07/2025   DTaP/Tdap/Td (2 - Td or Tdap) 02/04/2028   Pneumococcal Vaccine: 50+ Years  Completed   DEXA SCAN  Completed   Hepatitis C Screening  Completed   Hepatitis B Vaccines  Aged Out   HPV VACCINES  Aged Out   Meningococcal B Vaccine  Aged Out   Colonoscopy  Discontinued    Health Maintenance  Health Maintenance Due  Topic Date Due   Zoster Vaccines- Shingrix (2 of 2) 03/31/2022   COVID-19 Vaccine (6 - 2024-25 season) 07/19/2023   Health Maintenance Items Addressed: Due for second shingles vaccine. Declines covid vaccine.  Additional Screening:  Vision Screening: Recommended annual ophthalmology exams for early detection of glaucoma and other disorders of the eye. Would you like a referral to an eye doctor? No    Dental Screening: Recommended annual dental exams for proper oral hygiene  Community Resource Referral / Chronic Care Management: CRR required this visit?  No   CCM required this visit?  No   Plan:    I have personally reviewed and  noted the following in the patient's chart:   Medical and social history Use of alcohol, tobacco or illicit drugs  Current medications and supplements including opioid prescriptions. Patient is not currently taking opioid prescriptions. Functional ability and status Nutritional status Physical activity Advanced directives List of other physicians Hospitalizations, surgeries, and ER visits in previous 12 months Vitals Screenings to include cognitive, depression, and falls Referrals and appointments  In addition, I have reviewed and discussed with patient certain preventive protocols, quality metrics, and best practice recommendations. A written personalized care plan for preventive services as well as general preventive health recommendations were provided to patient.   Ardella FORBES Dawn, LPN   [id number]   After Visit Summary: (Pick Up) Due to this being a telephonic visit, with patients personalized plan was offered to patient and patient has requested to Pick up at office.  Notes: Nothing significant to report at this time.

## 2024-06-07 NOTE — Progress Notes (Signed)
   06/07/2024  Patient ID: [name], female   DOB: [birth year], 78 y.o.   MRN: [id number]  Pharmacy Quality Measure Review  This patient is appearing on a report for being at risk of failing the adherence measure for cholesterol (statin) medications this calendar year.   Medication: Atorvastatin  Last fill date: 05/11/24 for 30 day supply  Insurance report was not up to date. No action needed at this time.   Jon VEAR Lindau, PharmD Clinical Pharmacist [phone]

## 2024-06-07 NOTE — Patient Instructions (Signed)
 [name] , Thank you for taking time out of your busy schedule to complete your Annual Wellness Visit with me. I enjoyed our conversation and look forward to speaking with you again next year. I, as well as your care team,  appreciate your ongoing commitment to your health goals. Please review the following plan we discussed and let me know if I can assist you in the future. Your Game plan/ To Do List    Referrals: If you haven't heard from the office you've been referred to, please reach out to them at the phone provided.  N/a Follow up Visits: Next Medicare AWV with our clinical staff: 06/13/2025 at 3:30   Have you seen your provider in the last 6 months (3 months if uncontrolled diabetes)? Yes Next Office Visit with your provider: will call and schedule when ready  Clinician Recommendations:  Aim for 30 minutes of exercise or brisk walking, 6-8 glasses of water , and 5 servings of fruits and vegetables each day.       This is a list of the screening recommended for you and due dates:  Health Maintenance  Topic Date Due   Zoster (Shingles) Vaccine (2 of 2) 03/31/2022   COVID-19 Vaccine (6 - 2024-25 season) 07/19/2023   Flu Shot  06/17/2024   Medicare Annual Wellness Visit  06/07/2025   DTaP/Tdap/Td vaccine (2 - Td or Tdap) 02/04/2028   Pneumococcal Vaccine for age over 11  Completed   DEXA scan (bone density measurement)  Completed   Hepatitis C Screening  Completed   Hepatitis B Vaccine  Aged Out   HPV Vaccine  Aged Out   Meningitis B Vaccine  Aged Out   Colon Cancer Screening  Discontinued    Advanced directives: (Declined) Advance directive discussed with you today. Even though you declined this today, please call our office should you change your mind, and we can give you the proper paperwork for you to fill out. Advance Care Planning is important because it:  [x]  Makes sure you receive the medical care that is consistent with your values, goals, and preferences  [x]  It  provides guidance to your family and loved ones and reduces their decisional burden about whether or not they are making the right decisions based on your wishes.  Follow the link provided in your after visit summary or read over the paperwork we have mailed to you to help you started getting your Advance Directives in place. If you need assistance in completing these, please reach out to us  so that we can help you!  See attachments for Preventive Care and Fall Prevention Tips.

## 2024-06-08 ENCOUNTER — Other Ambulatory Visit: Payer: Self-pay | Admitting: Family Medicine

## 2024-06-08 DIAGNOSIS — I739 Peripheral vascular disease, unspecified: Secondary | ICD-10-CM

## 2024-06-08 DIAGNOSIS — I70213 Atherosclerosis of native arteries of extremities with intermittent claudication, bilateral legs: Secondary | ICD-10-CM

## 2024-06-21 NOTE — Progress Notes (Signed)
   06/21/2024  Patient ID: [name], female   DOB: August 29, 1946, 78 y.o.   MRN: [id number]  Pharmacy Quality Measure Review  This patient is appearing on a report for being at risk of failing the adherence measure for cholesterol (statin) medications this calendar year.   Medication: Atorvastatin  Last fill date: 06/08/24 for 90 day supply  Insurance report was not up to date. No action needed at this time.   Jon VEAR Lindau, PharmD Clinical Pharmacist [phone]

## 2024-09-02 ENCOUNTER — Other Ambulatory Visit: Payer: Self-pay | Admitting: Family Medicine

## 2024-09-02 DIAGNOSIS — I70213 Atherosclerosis of native arteries of extremities with intermittent claudication, bilateral legs: Secondary | ICD-10-CM

## 2024-09-02 DIAGNOSIS — I739 Peripheral vascular disease, unspecified: Secondary | ICD-10-CM

## 2024-09-13 ENCOUNTER — Ambulatory Visit: Admitting: Family Medicine

## 2024-09-13 ENCOUNTER — Encounter: Payer: Self-pay | Admitting: Family Medicine

## 2024-09-13 VITALS — BP 114/66 | HR 68

## 2024-09-13 DIAGNOSIS — D229 Melanocytic nevi, unspecified: Secondary | ICD-10-CM

## 2024-09-13 NOTE — Progress Notes (Signed)
   Subjective:    Patient ID: [name], female    DOB: 1946-02-15, 78 y.o.   MRN: [id number]  HPI She is here for evaluation of a mole present on her right cheek.  She feels as if it has a slight halo around it. Review of Systems     Objective:    Physical Exam Exam of the right cheek does show slightly more than a centimeter round smooth confluently pigmented lesion however no true halo could be seen.  The border was well-demarcated.  A picture was taken on her phone.       Assessment & Plan:  Benign mole I explained that we evaluate these by area, border, diameter and color and using these criteria I think this is benign.  Did take a picture and she can always come back in 6 months so we can reevaluate that.  She was comfortable with that.  She is also recommend to go to the pharmacy to get her next shingles shot.

## 2024-09-20 DIAGNOSIS — Z8551 Personal history of malignant neoplasm of bladder: Secondary | ICD-10-CM | POA: Diagnosis not present

## 2024-09-20 DIAGNOSIS — R3915 Urgency of urination: Secondary | ICD-10-CM | POA: Diagnosis not present

## 2024-09-20 DIAGNOSIS — C679 Malignant neoplasm of bladder, unspecified: Secondary | ICD-10-CM | POA: Diagnosis not present

## 2024-09-20 DIAGNOSIS — R399 Unspecified symptoms and signs involving the genitourinary system: Secondary | ICD-10-CM | POA: Diagnosis not present

## 2024-10-18 ENCOUNTER — Telehealth: Payer: Self-pay

## 2024-10-18 NOTE — Telephone Encounter (Signed)
 Copied from CRM [id number]. Topic: Clinical - Medical Advice >> Oct 18, 2024  3:37 PM Leonette P wrote: Reason for CRM: Pt called asking if there is anything she can put on her moles.  She said they are getting bigger and she is getting more of them  [phone]

## 2024-10-19 ENCOUNTER — Telehealth: Payer: Self-pay | Admitting: Family Medicine

## 2024-10-19 NOTE — Telephone Encounter (Signed)
 Copied from CRM [id number]. Topic: General - Other >> Oct 19, 2024  1:38 PM Donna BRAVO wrote: Reason for CRM: Patient is returning a missed call read note verbatim:  Johnnye Jon SQUIBB, CMA to KIMBERLYE DILGER     10/19/24  1:24 PM Hi Merilyn,  I called you back regarding your call about the concern you are having with moles. Dr. Joyce said Unfortunately there is really nothing we can do with moles. They tend to get worse as we get older. If you have any questions about any of them, we can set up an appointment for you if you are wanting a referral somewhere that specializes in this sort of thing.  This MyChart message has not been read.   Patient would like to speak with Jon

## 2024-10-19 NOTE — Telephone Encounter (Signed)
 Spoke with Patient, she will call when she decides what she wants to do. She would like a referral to someone who can take care of this.

## 2024-10-19 NOTE — Telephone Encounter (Signed)
 Called Patient and she's not sure what to do, She would like for you to just refer her to someone to have moles removed. She will call back when she decides what to do.

## 2024-10-20 ENCOUNTER — Ambulatory Visit: Admitting: Family Medicine

## 2024-10-20 ENCOUNTER — Encounter: Payer: Self-pay | Admitting: Family Medicine

## 2024-10-20 VITALS — BP 130/60 | HR 68 | Temp 99.3°F | Ht 61.0 in | Wt 112.0 lb

## 2024-10-20 DIAGNOSIS — R21 Rash and other nonspecific skin eruption: Secondary | ICD-10-CM | POA: Diagnosis not present

## 2024-10-20 DIAGNOSIS — I739 Peripheral vascular disease, unspecified: Secondary | ICD-10-CM

## 2024-10-20 DIAGNOSIS — L853 Xerosis cutis: Secondary | ICD-10-CM

## 2024-10-20 DIAGNOSIS — M79671 Pain in right foot: Secondary | ICD-10-CM | POA: Diagnosis not present

## 2024-10-20 DIAGNOSIS — Z5181 Encounter for therapeutic drug level monitoring: Secondary | ICD-10-CM | POA: Diagnosis not present

## 2024-10-20 DIAGNOSIS — R252 Cramp and spasm: Secondary | ICD-10-CM

## 2024-10-20 DIAGNOSIS — I10 Essential (primary) hypertension: Secondary | ICD-10-CM

## 2024-10-20 NOTE — Progress Notes (Unsigned)
 Chief Complaint  Patient presents with   Rash    Rash on both legs and swelling of both legs. Wants to know if it is okay to take advil with the plavix  and asa she is on.    Last week she noticed burning and itching on both lower legs. She noticed red bumps on both legs (more on the left). No pets, but her grandchildren have cats and dogs. She is in her garden, but keeps her legs covered. Denies any other new contacts/exposures.  She also has dry, flaky skin on her legs, that she can't seem to fix. Wonders if she damaged her skin from using alcohol on the skin in the past. She used 1% HC cream for about a week--was using for dry skin, prior to the rash, and then ran out. Unclear how many days it was used while rash was present. She has itchy, dry skin throughout. Endorses enjoying hot baths.  +claudication, R>L, described as cramping.  Legs cramp when her legs get cold. No issues with walking in [place], but stops in aisles. She exercises during TV commercials and that doesn't bother her. She has known PAD, hasn't seen vascular in 2 years.  Chart reviewed--prev sleep was affected by neuropathy in feet, was started on gabapentin  300 mg with good results (rx'd by Dr. Serene).  Sleeping well per last CPE 02/2023.  She states today that she took it twice daily for a month, was too sedating. She switched to taking it just at night. She realls having balance issues while on the medication.  Hasn't seen vascular since 07/2022.  She is s/p aortobifemoral bypass graft on 05/16/2011, done for claudication.  She is s/p L femoral endarterectomy with dacryon patch angioplasty in 01/2020.  She complained of severe R leg pain, alleviated by hanging leg over the edge of the bed in 05/2022. Angiography on 06/24/2022 and was found to have a flush occlusion of the right superficial femoral artery. She was treated with gabapentin  as reported above, with improvement in symptoms at f/u visit, so further treatment was not  pursued (he had suggested possible R femoral to above-knee popliteal bypass). She states today that she had a deep-rooted callous on the back of her heel that was the problem causing her pain and affecting her sleep. She states she still has some callous remaining on the back of the left heel.  It used to be like a horseshoe shape, and is now smaller. Still has discomfort. She is still working on it. Hasn't seen podiatrist for this (just for toenail clipping, no longer goes).  She complains of pain/arthritis in her neck, has R shoulder pain. Tylenol  wasn't effective in the past.  She jumps from topic to topic with many complaints, questions, comments about what she has tried based on recommendations from friends and family (ie spraying water  from a bottle onto her legs to help with dryness).  She brings in LOTS of OTC meds, creams, lotions that she has used at various time.    PMH, PSH, SH reviewed PAD, HTN, h/o TIA, h/o breast CA, bladder CA (recently completed treatments), gall stones, constipation  Outpatient Encounter Medications as of 10/20/2024  Medication Sig Note   aspirin  EC 81 MG tablet Take 81 mg by mouth in the morning.    atorvastatin  (LIPITOR) 20 MG tablet TAKE 1 TABLET BY MOUTH EVERY DAY    Camphor-Eucalyptus-Menthol (VICKS VAPORUB) 4.73-1.2-2.6 % OINT Apply 1 application topically 3 (three) times daily as needed (leg discomfort). 10/20/2024:  Vitamin d    Cholecalciferol (VITAMIN D) 50 MCG (2000 UT) tablet Take 2,000 Units by mouth every evening.     clopidogrel  (PLAVIX ) 75 MG tablet TAKE 1 TABLET BY MOUTH EVERY DAY. RESUME 1 TIME YOU NO LONGER HAVE ANY BLEEDING IN YOUR URINE OR STOOL AND HAVE COMPLETED ALL SCHEDULED PROCEDURES    hydrochlorothiazide  (HYDRODIURIL ) 25 MG tablet TAKE 1 TABLET BY MOUTH EVERY DAY    ibuprofen (ADVIL) 200 MG tablet Take 200-400 mg by mouth every 8 (eight) hours as needed (pain.). 10/20/2024: As needed   Iron-Vitamins (GERITOL PO) Take 20 mLs by  mouth daily.    lactose free nutrition (BOOST) LIQD Take 237 mLs by mouth 3 (three) times a week. 10/20/2024: As needed, maybe once a week   neomycin-bacitracin-polymyxin (NEOSPORIN) [phone] ointment Apply topically 4 (four) times daily. 10/20/2024: Has been using for the last couple of days   NONFORMULARY OR COMPOUNDED ITEM  10/20/2024: Aquafor Ointment, has been using the last couple of days   NONFORMULARY OR COMPOUNDED ITEM  10/20/2024: Scalpacin Has been using for the last couple of days   NONFORMULARY OR COMPOUNDED ITEM  10/20/2024: Dr Marilynn been using for the last couple of days   [DISCONTINUED] acetaminophen  (TYLENOL ) 500 MG tablet Take 1,000 mg by mouth every 6 (six) hours as needed for mild pain or moderate pain.    [DISCONTINUED] docusate sodium  (COLACE) 100 MG capsule Take 200 mg by mouth 2 (two) times daily as needed for mild constipation.    Emollient (CETAPHIL) cream Apply 1 Application topically as needed (dry/irritated skin.). (Patient not taking: Reported on 10/20/2024)    gabapentin  (NEURONTIN ) 300 MG capsule Take 1 capsule (300 mg total) by mouth daily. (Patient not taking: Reported on 10/20/2024) 10/20/2024: Has not taken in a year   hydroxypropyl methylcellulose / hypromellose (ISOPTO TEARS / GONIOVISC) 2.5 % ophthalmic solution Place 1 drop into both eyes 3 (three) times daily as needed (dry/irritated eyes.). (Patient not taking: Reported on 09/13/2024)    triamcinolone  cream (KENALOG ) 0.1 % Apply 1 application topically 2 (two) times daily. (Patient not taking: Reported on 10/20/2024)    [DISCONTINUED] potassium chloride  (K-DUR) 10 MEQ tablet Take 2 tablets (20 mEq total) by mouth 2 (two) times daily.    No facility-administered encounter medications on file as of 10/20/2024.   Admits she takes the aspirin  every other day (Due to prior issues with bleeding in her intestines and bladder at the same time in the past). Chart reviewed--h/o gastric AVM that bled.  Allergies   Allergen Reactions   Latex Rash    ROS: no f/c, URI symptoms, HA, dizziness. Itchy, dry skin--scratching at her shoulder during visit--she states sometimes she does that when nervous. Dry legs. Cramping in legs when she is cold Rash on lower legs per HPI    PHYSICAL EXAM:  BP 130/60   Pulse 68   Temp 99.3 F (37.4 C) (Tympanic)   Ht 5' 1 (1.549 m)   Wt 112 lb (50.8 kg)   BMI 21.16 kg/m   Thin, pleasant female, accompanied by her husband Keven. She is talkative, often jumping from topic to another topic, making it somewhat difficult to finish a line of questioning related to her chief complaint. HEENT: conjunctiva and sclera are clear, EOMI Neck: no lymphadenopathy or mass Heart: regular rate and rhythm Lungs: clear  Back: no spinal or CVA tenderness Skin: mildly dry skin. Scattered red papules on L>R lower legs, most prominent at both ankles, spreading further up the  L leg than the right. Papules are 1-2 mm in size. Decreased pulses RLE, 2+ DP on the left Extremities: No pitting edema, though L ankle appears just slightly larger.  Calves nontender. Slight thickening at R posterior heel. No true callous or lesion noted. Psych: mildly anxious, full range of affect. Normal hygiene and grooming Neuro: alert and oriented, cranial nerves grossly intact. Normal gait.   ASSESSMENT/PLAN:  Rash - acute onset, mainly at ankles.  Ddx includes insect bites vs contact derm. HC 1% BID-TID recommended. - Plan: TSH  Dry skin - encouraged to avoid prolonged hot baths/showers. Moisturize with cetaphil TID, and stay well hydrated - Plan: TSH  PAD (peripheral artery disease) - having cramping in legs--check K+ (h/o hypokalemia). Decreased pulses on R. Past due for f/u with vascular, reminded to schedule - Plan: Ambulatory referral to Podiatry  Essential hypertension - well controlled, Past due for labs. on diuretic, h/o hypokalemia. Check today - Plan: Comprehensive metabolic panel with  GFR  Leg cramps - to stay well hydrated. To get dietary potassium sources. Check labs today.  Also has known PAD, to f/u wth vascular - Plan: Comprehensive metabolic panel with GFR  Pain of right heel - given her PAD, I'd like her evaluated/treated by podiatry rather than dealing with it on her own. ?wart vs callous. Improved, per pt - Plan: Ambulatory referral to Podiatry  Medication monitoring encounter - Plan: Comprehensive metabolic panel with GFR, CBC with Differential/Platelet   Has no f/u scheduled. Last CPE was 02/2023 with [facility]. Has only been seen for acute visits. No labs since 02/2023. Meds have been refilled, but past due for labs. Will have her r/s her CPE (cancelled 2025 one) with Dr. Vita, vs med check sooner. Will order some labs today, but will need to return fasting for her lipids to be checked (on statin).  She is thinking about switching to a practice that is closer to her home, as she relies on her husband to drive her here. Discussed this--likely premature since her husband (my patient) is healthy and doesn't plan on not being able to drive any time soon.  I spent 50 minutes dedicated to the care of this patient, including pre-visit review of records, face to face time, post-visit ordering of testing and documentation.   Use hydrocortisone 1% cream 2-3 times daily to the areas of the rash. Do not use this widely for dry skin. You should use the Cetaphil 2-3 times daily to help moisturize your skin. It is important to stay well hydrated and avoid prolonged hot baths and showers, as these dry your skin out.  If your rash isn't improving after a week of using the hydrocortisone cream, we can give you a prescription cream that is a little stronger.  Stronger creams must be used very sparingly, only to the areas of rash, and no more than 2x/day.  Let's see how the hydrocortisone cream works first.  We discussed using Tylenol  Arthritis at night for any shoulder pain. This lasts  longer than regular tylenol  and is safer for you to use than advil. You can also try patches like SalonPas with lidocaine .

## 2024-10-20 NOTE — Patient Instructions (Addendum)
  Use hydrocortisone 1% cream 2-3 times daily to the areas of the rash. Do not use this widely for dry skin. You should use the Cetaphil 2-3 times daily to help moisturize your skin. It is important to stay well hydrated and avoid prolonged hot baths and showers, as these dry your skin out.  If your rash isn't improving after a week of using the hydrocortisone cream, we can give you a prescription cream that is a little stronger.  Stronger creams must be used very sparingly, only to the areas of rash, and no more than 2x/day.  Let's see how the hydrocortisone cream works first.  We discussed using Tylenol  Arthritis at night for any shoulder pain. This lasts longer than regular tylenol  and is safer for you to use than advil. You can also try patches like SalonPas with lidocaine .  Please contact Dr. Russ office to schedule routine follow up on your peripheral arterial disease.

## 2024-10-21 ENCOUNTER — Ambulatory Visit: Payer: Self-pay | Admitting: Family Medicine

## 2024-10-21 ENCOUNTER — Encounter: Payer: Self-pay | Admitting: Family Medicine

## 2024-10-21 ENCOUNTER — Ambulatory Visit: Payer: Self-pay | Admitting: *Deleted

## 2024-10-21 DIAGNOSIS — E876 Hypokalemia: Secondary | ICD-10-CM

## 2024-10-21 LAB — COMPREHENSIVE METABOLIC PANEL WITH GFR
ALT: 15 IU/L (ref 0–32)
AST: 27 IU/L (ref 0–40)
Albumin: 4.2 g/dL (ref 3.8–4.8)
Alkaline Phosphatase: 126 IU/L (ref 49–135)
BUN/Creatinine Ratio: 14 (ref 12–28)
BUN: 14 mg/dL (ref 8–27)
Bilirubin Total: 0.3 mg/dL (ref 0.0–1.2)
CO2: 28 mmol/L (ref 20–29)
Calcium: 9.5 mg/dL (ref 8.7–10.3)
Chloride: 98 mmol/L (ref 96–106)
Creatinine, Ser: 0.99 mg/dL (ref 0.57–1.00)
Globulin, Total: 3 g/dL (ref 1.5–4.5)
Glucose: 75 mg/dL (ref 70–99)
Potassium: 3.4 mmol/L — AB (ref 3.5–5.2)
Sodium: 139 mmol/L (ref 134–144)
Total Protein: 7.2 g/dL (ref 6.0–8.5)
eGFR: 59 mL/min/1.73 — AB (ref >59)

## 2024-10-21 LAB — CBC WITH DIFFERENTIAL/PLATELET
Basophils Absolute: 0 x10E3/uL (ref 0.0–0.2)
Basos: 1 %
EOS (ABSOLUTE): 0.1 x10E3/uL (ref 0.0–0.4)
Eos: 2 %
Hematocrit: 40.5 % (ref 34.0–46.6)
Hemoglobin: 12.8 g/dL (ref 11.1–15.9)
Immature Grans (Abs): 0 x10E3/uL (ref 0.0–0.1)
Immature Granulocytes: 0 %
Lymphocytes Absolute: 2.1 x10E3/uL (ref 0.7–3.1)
Lymphs: 41 %
MCH: 28.8 pg (ref 26.6–33.0)
MCHC: 31.6 g/dL (ref 31.5–35.7)
MCV: 91 fL (ref 79–97)
Monocytes Absolute: 0.3 x10E3/uL (ref 0.1–0.9)
Monocytes: 5 %
Neutrophils Absolute: 2.6 x10E3/uL (ref 1.4–7.0)
Neutrophils: 51 %
Platelets: 183 x10E3/uL (ref 150–450)
RBC: 4.44 x10E6/uL (ref 3.77–5.28)
RDW: 12.8 % (ref 11.7–15.4)
WBC: 5.1 x10E3/uL (ref 3.4–10.8)

## 2024-10-21 LAB — TSH: TSH: 1.22 u[IU]/mL (ref 0.450–4.500)

## 2024-10-21 NOTE — Telephone Encounter (Signed)
  FYI Only or Action Required?: Action required by provider: referral request, update on patient condition, and please advise if additional medication needed.  Patient was last seen in primary care on 10/20/2024 by Randol Dawes, MD.  Called Nurse Triage reporting Rash.  Symptoms began yesterday.  Interventions attempted: Prescription medications: hydrocortisone cream and cetaphil .  Symptoms are: gradually worsening.  Triage Disposition: See Physician Within 24 Hours  Patient/caregiver understands and will follow disposition?: No, wishes to speak with PCP    No appt scheduled . Patient requesting dermatology referral. Requesting if any other medication can be given. Unsure if patient allowed hydrocortisone cream time for improvement of sx. Please advise.             Copied from CRM [id number]. Topic: Clinical - Red Word Triage >> Oct 21, 2024 10:39 AM Donna BRAVO wrote: Red Word that prompted transfer to Nurse Triage:  Dealing with this for months Symptoms: - nerves are so bad  -feels like she is going crazy -can't sleep -itching at night to the point she can't sleep -corner of mouth and has brock out more -corner of right side eye  -burning and  -inside and outside of ears are burning and itching -if she calms down one spot another pops up Reason for Disposition  [1] MODERATE-SEVERE widespread itching (i.e., interferes with sleep, normal activities or school) AND [2] not improved after 24 hours of itching Care Advice  Answer Assessment - Initial Assessment Questions Patient requesting dermatology referral. Reports hydrocortisone cream helped last night to leg rash for itching but returned this am. Now itching spread to upper back, neck, head, under armpits. Hives when scratching appear. Itching in and out side of ears. Applied hydrocortisone cream and ear itching decreased. Patient very anxious and emotional support provided. Patient not anxious at end of call. Reports she  feels her right arm is weaker than it has been since ministroke. Denies chest pain no difficulty breathing no fever, no headaches no dizziness reported no blurred vision reported. No weakness on either side . Patient reports not sleeping well. Reports rash and itching has started and worsening since March. Recommended if worsening sx go to UC/ED.  Reviewed instructions multiple times with patient PCP recommendations for hydrocortisone cream to be applied to rash areas only no more than 2-3 times daily and cetaphil to be applied to dry skin areas only . Please advise if other prescription needed.        1. DESCRIPTION: Describe the itching you are having.     Bilateral itching upper back , neck , head, under armpits,  2. SEVERITY: How bad is it?      Not as bad now but this am worsening itching  3. SCRATCHING: Are there any scratch marks? Bleeding?     No  4. ONSET: When did this begin? (e.g., minutes, hours, days ago)      Started again this am  5. CAUSE: What do you think is causing the itching? (ask about swimming pools, pollen, animals, soaps, etc.)     Not sure  6. OTHER SYMPTOMS: Do you have any other symptoms? (e.g., fever, rash)     Itching spreading to arms neck back head, armpits . No bumps or rash but when patient scratches looks like hives appear.  7. PREGNANCY: Is there any chance you are pregnant? When was your last menstrual period?     na  Protocols used: Itching - Widespread-A-AH

## 2024-10-21 NOTE — Telephone Encounter (Signed)
 Called Patient, let her know she needs to give more time for cream to work, made an appt with Dr. Joyce for 10/27/24.

## 2024-10-25 MED ORDER — POTASSIUM CHLORIDE CRYS ER 10 MEQ PO TBCR: 10.0000 meq | EXTENDED_RELEASE_TABLET | Freq: Every day | ORAL | 2 refills | Status: AC

## 2024-10-25 NOTE — Telephone Encounter (Signed)
 Spoke with patient and she would prefer a potassium be sent to the pharmacy rather than trying to eat more things with potassium as she is not a good eater. Patient uses [facility] [address]

## 2024-10-27 ENCOUNTER — Ambulatory Visit (INDEPENDENT_AMBULATORY_CARE_PROVIDER_SITE_OTHER): Admitting: Family Medicine

## 2024-10-27 ENCOUNTER — Telehealth: Payer: Self-pay | Admitting: Family Medicine

## 2024-10-27 VITALS — BP 118/68 | HR 78 | Ht 61.0 in | Wt 112.8 lb

## 2024-10-27 DIAGNOSIS — F418 Other specified anxiety disorders: Secondary | ICD-10-CM

## 2024-10-27 NOTE — Telephone Encounter (Signed)
 When patient calls back, she needs to schedule a lab visit for BMET 2-4 weeks from today.

## 2024-10-27 NOTE — Progress Notes (Signed)
° °  Subjective:    Patient ID: [name], female    DOB: 07-29-1946, 78 y.o.   MRN: [id number]  HPI She is here initially for consult concerning dermatitis however she says that she thinks the skin problems are because of anxiety.  We then proceeded to talk at length concerning her stress and anxiety and she blames this on her relatives who apparently use her as a sounding board for various problems that they are having and she feels stressed from this and an obligation to try to help them.  She would like an anxiety medication to help with this however I explained that that will not eliminate the cause of her anxiety which is how she is responding to people and allowing them to manipulate her.   Review of Systems     Objective:    Physical Exam Alert and in no distress otherwise not examined       Assessment & Plan:  Situational anxiety I talked at length to her concerning the stress that she is under and how she is handling this.  Offered on multiple times to refer her to a psychologist to help give her better tools to handle this stress rather than internalizing this.  She had multiple times to continue to turn it down and only wanted medication for this.  I explained that I would give her a low-dose of Xanax  but did not plan to continue this forever explained that this is not the best way to handle stress.  Her husband was in the room for the entire visit and agreed with my approach.  She

## 2024-11-14 ENCOUNTER — Other Ambulatory Visit: Payer: Self-pay

## 2024-11-14 DIAGNOSIS — I739 Peripheral vascular disease, unspecified: Secondary | ICD-10-CM

## 2024-11-23 ENCOUNTER — Ambulatory Visit: Admitting: Podiatry

## 2024-11-29 ENCOUNTER — Other Ambulatory Visit

## 2024-11-29 DIAGNOSIS — E876 Hypokalemia: Secondary | ICD-10-CM

## 2024-11-30 ENCOUNTER — Ambulatory Visit: Payer: Self-pay | Admitting: Family Medicine

## 2024-11-30 LAB — BASIC METABOLIC PANEL WITH GFR
BUN/Creatinine Ratio: 11 — AB (ref 12–28)
BUN: 11 mg/dL (ref 8–27)
CO2: 22 mmol/L (ref 20–29)
Calcium: 9.3 mg/dL (ref 8.7–10.3)
Chloride: 96 mmol/L (ref 96–106)
Creatinine, Ser: 1.03 mg/dL — AB (ref 0.57–1.00)
Glucose: 79 mg/dL (ref 70–99)
Potassium: 4.6 mmol/L (ref 3.5–5.2)
Sodium: 137 mmol/L (ref 134–144)
eGFR: 56 mL/min/1.73 — AB

## 2024-12-02 ENCOUNTER — Other Ambulatory Visit: Payer: Self-pay | Admitting: Family Medicine

## 2024-12-12 ENCOUNTER — Ambulatory Visit (HOSPITAL_COMMUNITY)

## 2024-12-19 ENCOUNTER — Ambulatory Visit

## 2024-12-19 ENCOUNTER — Ambulatory Visit (HOSPITAL_COMMUNITY)

## 2025-02-07 ENCOUNTER — Encounter: Admitting: Family Medicine

## 2025-06-13 ENCOUNTER — Ambulatory Visit: Payer: Self-pay
